# Patient Record
Sex: Male | Born: 1940 | Race: White | Hispanic: No | Marital: Married | Smoking: Former smoker
Health system: Southern US, Community
[De-identification: ages and names within clinical notes are randomized; demographics above are authoritative.]

## PROBLEM LIST (undated history)

## (undated) DIAGNOSIS — I472 Ventricular tachycardia, unspecified: Secondary | ICD-10-CM

## (undated) DIAGNOSIS — M1990 Unspecified osteoarthritis, unspecified site: Secondary | ICD-10-CM

## (undated) DIAGNOSIS — J449 Chronic obstructive pulmonary disease, unspecified: Secondary | ICD-10-CM

## (undated) DIAGNOSIS — Z95810 Presence of automatic (implantable) cardiac defibrillator: Secondary | ICD-10-CM

## (undated) DIAGNOSIS — I509 Heart failure, unspecified: Secondary | ICD-10-CM

## (undated) DIAGNOSIS — R0602 Shortness of breath: Secondary | ICD-10-CM

## (undated) DIAGNOSIS — G709 Myoneural disorder, unspecified: Secondary | ICD-10-CM

## (undated) DIAGNOSIS — I5022 Chronic systolic (congestive) heart failure: Secondary | ICD-10-CM

## (undated) DIAGNOSIS — I4891 Unspecified atrial fibrillation: Secondary | ICD-10-CM

## (undated) DIAGNOSIS — I10 Essential (primary) hypertension: Secondary | ICD-10-CM

## (undated) DIAGNOSIS — I2510 Atherosclerotic heart disease of native coronary artery without angina pectoris: Secondary | ICD-10-CM

## (undated) HISTORY — DX: Chronic obstructive pulmonary disease, unspecified: J44.9

## (undated) HISTORY — DX: Ventricular tachycardia: I47.2

## (undated) HISTORY — DX: Unspecified osteoarthritis, unspecified site: M19.90

## (undated) HISTORY — DX: Chronic systolic (congestive) heart failure: I50.22

## (undated) HISTORY — DX: Ventricular tachycardia, unspecified: I47.20

## (undated) HISTORY — PX: OTHER SURGICAL HISTORY: SHX169

## (undated) HISTORY — DX: Unspecified atrial fibrillation: I48.91

## (undated) HISTORY — DX: Essential (primary) hypertension: I10

## (undated) HISTORY — PX: VASECTOMY: SHX75

## (undated) HISTORY — DX: Presence of automatic (implantable) cardiac defibrillator: Z95.810

## (undated) HISTORY — PX: CARDIAC DEFIBRILLATOR PLACEMENT: SHX171

## (undated) HISTORY — DX: Atherosclerotic heart disease of native coronary artery without angina pectoris: I25.10

## (undated) HISTORY — PX: TONSILLECTOMY: SUR1361

## (undated) HISTORY — PX: APPENDECTOMY: SHX54

---

## 1998-03-17 ENCOUNTER — Ambulatory Visit (HOSPITAL_COMMUNITY): Admission: RE | Admit: 1998-03-17 | Discharge: 1998-03-17 | Payer: Self-pay | Admitting: *Deleted

## 2000-05-21 ENCOUNTER — Ambulatory Visit (HOSPITAL_COMMUNITY): Admission: RE | Admit: 2000-05-21 | Discharge: 2000-05-21 | Payer: Self-pay | Admitting: Gastroenterology

## 2000-05-21 ENCOUNTER — Encounter (INDEPENDENT_AMBULATORY_CARE_PROVIDER_SITE_OTHER): Payer: Self-pay | Admitting: *Deleted

## 2003-09-14 ENCOUNTER — Ambulatory Visit (HOSPITAL_COMMUNITY): Admission: RE | Admit: 2003-09-14 | Discharge: 2003-09-14 | Payer: Self-pay | Admitting: Internal Medicine

## 2004-03-07 ENCOUNTER — Encounter: Admission: RE | Admit: 2004-03-07 | Discharge: 2004-03-07 | Payer: Self-pay | Admitting: Internal Medicine

## 2006-11-06 ENCOUNTER — Ambulatory Visit (HOSPITAL_COMMUNITY): Admission: RE | Admit: 2006-11-06 | Discharge: 2006-11-06 | Payer: Self-pay | Admitting: Internal Medicine

## 2006-11-06 ENCOUNTER — Encounter: Payer: Self-pay | Admitting: Vascular Surgery

## 2006-11-06 ENCOUNTER — Ambulatory Visit: Payer: Self-pay | Admitting: Vascular Surgery

## 2007-09-28 ENCOUNTER — Ambulatory Visit: Payer: Self-pay | Admitting: Internal Medicine

## 2007-10-16 ENCOUNTER — Encounter: Payer: Self-pay | Admitting: Internal Medicine

## 2007-10-17 ENCOUNTER — Encounter: Payer: Self-pay | Admitting: Internal Medicine

## 2007-10-17 ENCOUNTER — Inpatient Hospital Stay (HOSPITAL_COMMUNITY): Admission: EM | Admit: 2007-10-17 | Discharge: 2007-10-19 | Payer: Self-pay | Admitting: Emergency Medicine

## 2007-10-18 ENCOUNTER — Encounter: Payer: Self-pay | Admitting: Internal Medicine

## 2007-10-18 ENCOUNTER — Encounter (INDEPENDENT_AMBULATORY_CARE_PROVIDER_SITE_OTHER): Payer: Self-pay | Admitting: Internal Medicine

## 2007-10-19 ENCOUNTER — Encounter: Payer: Self-pay | Admitting: Internal Medicine

## 2007-12-13 ENCOUNTER — Encounter: Payer: Self-pay | Admitting: Internal Medicine

## 2007-12-13 ENCOUNTER — Emergency Department (HOSPITAL_COMMUNITY): Admission: EM | Admit: 2007-12-13 | Discharge: 2007-12-13 | Payer: Self-pay | Admitting: Emergency Medicine

## 2009-08-19 ENCOUNTER — Encounter: Payer: Self-pay | Admitting: Internal Medicine

## 2009-08-24 ENCOUNTER — Encounter: Payer: Self-pay | Admitting: Internal Medicine

## 2009-08-30 ENCOUNTER — Ambulatory Visit: Payer: Self-pay | Admitting: Internal Medicine

## 2009-08-30 DIAGNOSIS — F17200 Nicotine dependence, unspecified, uncomplicated: Secondary | ICD-10-CM

## 2009-08-30 DIAGNOSIS — K219 Gastro-esophageal reflux disease without esophagitis: Secondary | ICD-10-CM | POA: Insufficient documentation

## 2009-08-30 DIAGNOSIS — E119 Type 2 diabetes mellitus without complications: Secondary | ICD-10-CM

## 2009-08-30 DIAGNOSIS — J449 Chronic obstructive pulmonary disease, unspecified: Secondary | ICD-10-CM | POA: Insufficient documentation

## 2009-08-30 DIAGNOSIS — I5022 Chronic systolic (congestive) heart failure: Secondary | ICD-10-CM

## 2009-08-30 DIAGNOSIS — M1990 Unspecified osteoarthritis, unspecified site: Secondary | ICD-10-CM | POA: Insufficient documentation

## 2009-08-30 DIAGNOSIS — I447 Left bundle-branch block, unspecified: Secondary | ICD-10-CM

## 2009-08-30 DIAGNOSIS — J4489 Other specified chronic obstructive pulmonary disease: Secondary | ICD-10-CM | POA: Insufficient documentation

## 2009-08-31 ENCOUNTER — Encounter: Payer: Self-pay | Admitting: Internal Medicine

## 2009-09-07 ENCOUNTER — Ambulatory Visit: Payer: Self-pay | Admitting: Internal Medicine

## 2009-09-10 ENCOUNTER — Encounter: Payer: Self-pay | Admitting: Internal Medicine

## 2009-09-10 ENCOUNTER — Observation Stay (HOSPITAL_COMMUNITY)
Admission: RE | Admit: 2009-09-10 | Discharge: 2009-09-10 | Payer: Self-pay | Source: Home / Self Care | Admitting: Internal Medicine

## 2009-09-13 ENCOUNTER — Encounter: Payer: Self-pay | Admitting: Internal Medicine

## 2009-09-13 ENCOUNTER — Telehealth: Payer: Self-pay | Admitting: Internal Medicine

## 2009-09-13 LAB — CONVERTED CEMR LAB
BUN: 29 mg/dL — ABNORMAL HIGH (ref 6–23)
Basophils Absolute: 0.1 10*3/uL (ref 0.0–0.1)
Basophils Relative: 0.7 % (ref 0.0–3.0)
CO2: 32 meq/L (ref 19–32)
Calcium: 9.7 mg/dL (ref 8.4–10.5)
Creatinine, Ser: 1.2 mg/dL (ref 0.4–1.5)
Eosinophils Absolute: 0.2 10*3/uL (ref 0.0–0.7)
Eosinophils Relative: 2.9 % (ref 0.0–5.0)
GFR calc non Af Amer: 63.69 mL/min (ref >60)
Glucose, Bld: 126 mg/dL — ABNORMAL HIGH (ref 70–99)
Lymphs Abs: 1.8 10*3/uL (ref 0.7–4.0)
Monocytes Absolute: 0.8 10*3/uL (ref 0.1–1.0)
Neutro Abs: 4.4 10*3/uL (ref 1.4–7.7)
Prothrombin Time: 27.8 s — ABNORMAL HIGH (ref 9.1–11.7)
RBC: 3.81 M/uL — ABNORMAL LOW (ref 4.22–5.81)
Sodium: 140 meq/L (ref 135–145)

## 2009-09-22 ENCOUNTER — Telehealth: Payer: Self-pay | Admitting: Internal Medicine

## 2009-09-27 ENCOUNTER — Ambulatory Visit: Payer: Self-pay | Admitting: Internal Medicine

## 2009-09-27 LAB — CONVERTED CEMR LAB
Basophils Absolute: 0 10*3/uL (ref 0.0–0.1)
Basophils Relative: 0.3 % (ref 0.0–3.0)
CO2: 30 meq/L (ref 19–32)
Calcium: 9.7 mg/dL (ref 8.4–10.5)
Creatinine, Ser: 1.2 mg/dL (ref 0.4–1.5)
Eosinophils Absolute: 0.1 10*3/uL (ref 0.0–0.7)
GFR calc non Af Amer: 63.68 mL/min (ref >60)
Hemoglobin: 12.9 g/dL — ABNORMAL LOW (ref 13.0–17.0)
INR: 3.1 — ABNORMAL HIGH (ref 0.8–1.0)
Lymphs Abs: 1.8 10*3/uL (ref 0.7–4.0)
MCV: 104.7 fL — ABNORMAL HIGH (ref 78.0–100.0)
Monocytes Absolute: 0.7 10*3/uL (ref 0.1–1.0)
Potassium: 4.7 meq/L (ref 3.5–5.1)
Prothrombin Time: 32 s — ABNORMAL HIGH (ref 9.1–11.7)
RBC: 3.65 M/uL — ABNORMAL LOW (ref 4.22–5.81)
RDW: 14.1 % (ref 11.5–14.6)
Sodium: 138 meq/L (ref 135–145)

## 2009-10-04 ENCOUNTER — Ambulatory Visit: Payer: Self-pay | Admitting: Internal Medicine

## 2009-10-04 ENCOUNTER — Inpatient Hospital Stay (HOSPITAL_COMMUNITY): Admission: RE | Admit: 2009-10-04 | Discharge: 2009-10-05 | Payer: Self-pay | Admitting: Internal Medicine

## 2009-10-06 ENCOUNTER — Encounter: Payer: Self-pay | Admitting: Internal Medicine

## 2009-10-08 ENCOUNTER — Telehealth: Payer: Self-pay | Admitting: Internal Medicine

## 2009-10-11 ENCOUNTER — Ambulatory Visit: Payer: Self-pay

## 2009-10-11 ENCOUNTER — Encounter: Payer: Self-pay | Admitting: Internal Medicine

## 2009-10-12 ENCOUNTER — Telehealth (INDEPENDENT_AMBULATORY_CARE_PROVIDER_SITE_OTHER): Payer: Self-pay | Admitting: *Deleted

## 2009-10-13 ENCOUNTER — Ambulatory Visit: Payer: Self-pay | Admitting: Internal Medicine

## 2009-10-20 ENCOUNTER — Ambulatory Visit: Payer: Self-pay | Admitting: Internal Medicine

## 2009-10-21 ENCOUNTER — Encounter: Payer: Self-pay | Admitting: Internal Medicine

## 2009-10-29 ENCOUNTER — Ambulatory Visit: Payer: Self-pay | Admitting: Internal Medicine

## 2009-10-29 LAB — CONVERTED CEMR LAB
BUN: 28 mg/dL — ABNORMAL HIGH (ref 6–23)
Basophils Relative: 0.1 % (ref 0.0–3.0)
Calcium: 9.4 mg/dL (ref 8.4–10.5)
Chloride: 99 meq/L (ref 96–112)
Eosinophils Relative: 3.2 % (ref 0.0–5.0)
GFR calc non Af Amer: 70.39 mL/min (ref >60)
INR: 2 — ABNORMAL HIGH (ref 0.8–1.0)
Lymphocytes Relative: 18.6 % (ref 12.0–46.0)
MCHC: 33.4 g/dL (ref 30.0–36.0)
Monocytes Absolute: 0.8 10*3/uL (ref 0.1–1.0)
Monocytes Relative: 9.2 % (ref 3.0–12.0)
Neutrophils Relative %: 68.9 % (ref 43.0–77.0)
Potassium: 4.2 meq/L (ref 3.5–5.1)
RBC: 3.67 M/uL — ABNORMAL LOW (ref 4.22–5.81)
RDW: 14.1 % (ref 11.5–14.6)
WBC: 8.2 10*3/uL (ref 4.5–10.5)
aPTT: 36.7 s — ABNORMAL HIGH (ref 21.7–28.8)

## 2009-11-05 ENCOUNTER — Inpatient Hospital Stay (HOSPITAL_COMMUNITY): Admission: RE | Admit: 2009-11-05 | Discharge: 2009-11-06 | Payer: Self-pay | Admitting: Internal Medicine

## 2009-11-05 ENCOUNTER — Ambulatory Visit: Payer: Self-pay | Admitting: Internal Medicine

## 2009-11-10 ENCOUNTER — Encounter: Payer: Self-pay | Admitting: Internal Medicine

## 2009-11-22 ENCOUNTER — Encounter: Payer: Self-pay | Admitting: Internal Medicine

## 2009-11-22 ENCOUNTER — Ambulatory Visit: Payer: Self-pay

## 2009-12-05 ENCOUNTER — Telehealth (INDEPENDENT_AMBULATORY_CARE_PROVIDER_SITE_OTHER): Payer: Self-pay | Admitting: Physician Assistant

## 2009-12-06 ENCOUNTER — Ambulatory Visit: Payer: Self-pay | Admitting: Internal Medicine

## 2009-12-06 ENCOUNTER — Telehealth: Payer: Self-pay | Admitting: Internal Medicine

## 2009-12-17 ENCOUNTER — Encounter: Payer: Self-pay | Admitting: Internal Medicine

## 2009-12-31 ENCOUNTER — Encounter: Payer: Self-pay | Admitting: Internal Medicine

## 2010-01-04 ENCOUNTER — Telehealth: Payer: Self-pay | Admitting: Internal Medicine

## 2010-01-04 ENCOUNTER — Ambulatory Visit: Payer: Self-pay | Admitting: Internal Medicine

## 2010-01-05 ENCOUNTER — Ambulatory Visit: Payer: Self-pay | Admitting: Internal Medicine

## 2010-04-12 ENCOUNTER — Ambulatory Visit: Payer: Self-pay | Admitting: Internal Medicine

## 2010-06-21 ENCOUNTER — Telehealth (INDEPENDENT_AMBULATORY_CARE_PROVIDER_SITE_OTHER): Payer: Self-pay | Admitting: *Deleted

## 2010-07-14 ENCOUNTER — Ambulatory Visit: Payer: Self-pay | Admitting: Internal Medicine

## 2010-08-09 ENCOUNTER — Telehealth: Payer: Self-pay | Admitting: Internal Medicine

## 2010-08-17 ENCOUNTER — Encounter (INDEPENDENT_AMBULATORY_CARE_PROVIDER_SITE_OTHER): Payer: Self-pay | Admitting: *Deleted

## 2010-09-06 NOTE — Letter (Signed)
SummaryDeboraha Landry Cardiology Office Note  [facility] Office Note   Imported By: Roderic Ovens 10/28/2009 11:33:55  _____________________________________________________________________  External Attachment:    Type:   Image     Comment:   External Document

## 2010-09-06 NOTE — Letter (Signed)
Summary: Implantable Device Instructions  Architectural technologist, Main Office  [address]   [place], [place] [address]   Phone: [phone]  Fax: [phone]      Implantable Device Instructions  You are scheduled for:  _____ Permanent Transvenous Pacemaker _____ Implantable Cardioverter Defibrillator _____ Implantable Loop Recorder _____ Generator Change __X___ Lead Repositioning  on Friday, November 05, 2009 with Dr. Graciela Husbands.  1.  Please arrive at the Short Stay Center at [facility] at 10AM on the day of your procedure.  2.  Do not eat or drink the night before your procedure.  3.  Complete lab work on Friday, October 29, 2009.  The lab at [facility] is open from 8:30 AM to 1:30 PM and from 2:30 PM to 5:00 PM.  The lab at [facility] is open from 7:30 AM to 5:30 PM.  You do not have to be fasting.  4.  Do NOT take these medications the morning of your procedure:  Metformin.  Take your last dose of Coumadin and Plavix on November 02, 2009.  5.  Plan for an overnight stay.  Bring your insurance cards and a list of your medications.  6.  Wash your chest and neck with antibacterial soap (any brand) the evening before and the morning of your procedure.  Rinse well.  7.  Education material received:     Pacemaker _____           ICD _____           Arrhythmia _____  *If you have ANY questions after you get home, please call the office [phone].  Amber or Shawna Orleans  *Every attempt is made to prevent procedures from being rescheduled.  Due to the nauture of Electrophysiology, rescheduling can happen.  The physician is always aware and directs the staff when this occurs.

## 2010-09-06 NOTE — Progress Notes (Signed)
Summary: Records Request  Faxed OV, EKG, EP Visit & Labs to [facility] at the [facility] [place] ([phone]). Debby Freiberg  June 21, 2010 12:36 PM

## 2010-09-06 NOTE — Procedures (Signed)
Summary: add-on.amber   Current Medications (verified): 1)  Furosemide 40 Mg Tabs (Furosemide) .... Take One Tablet Once Daily 2)  Metoprolol Tartrate 50 Mg Tabs (Metoprolol Tartrate) .... Take One Tablet Two Times A Day-Hold 3)  Plavix 75 Mg Tabs (Clopidogrel Bisulfate) .... Once Daily 4)  Metformin Hcl 500 Mg Tabs (Metformin Hcl) .... Two Times A Day 5)  Warfarin Sodium 5 Mg Tabs (Warfarin Sodium) .... Take As Directed By Coumadin Clinic. 6)  Lisinopril 5 Mg Tabs (Lisinopril) .... Take One Tablet Once Daily 7)  Lipitor 10 Mg Tabs (Atorvastatin Calcium) .... Once Daily 8)  Advair Diskus 100-50 Mcg/dose Aepb (Fluticasone-Salmeterol) .... Rarely 9)  Proventil Hfa 108 (90 Base) Mcg/act Aers (Albuterol Sulfate) .... As Needed 10)  Colcrys 0.6 Mg Tabs (Colchicine) .... Take As Needed  Allergies (verified): No Known Drug Allergies   ICD Specifications Following MD:  Sherryl Manges, MD     ICD Vendor:  St Jude     ICD Model Number:  [phone]     ICD Serial Number:  [id number] ICD DOI:  10/04/2009     ICD Implanting MD:  Sherryl Manges, MD  Lead 1:    Location: RA     DOI: 10/04/2009     Model #: [id number]     Serial #: [id number]     Status: active Lead 2:    Location: RV     DOI: 10/04/2009     Model #: [id number]     Serial #: [id number]     Status: active Lead 3:    Location: LV     DOI: 10/04/2009     Model #: 1258T     Serial #: [id number]     Status: active  Indications::  CM; Huston Foley   ICD Follow Up Remote Check?  No Battery Voltage:  >95% V     Charge Time:  8.0 seconds     Battery Est. Longevity:  6.2 years Underlying rhythm:  SR WITH FREQUENTLY CONDUCTED PAC'S ICD Dependent:  No       ICD Device Measurements Atrium:  Amplitude: 2.3 mV, Impedance: 340 ohms, Threshold: 0.875 V at 0.5 msec Right Ventricle:  Amplitude: 4.9 mV, Impedance: 610 ohms, Threshold: 1.25 V at 0.5 msec Left Ventricle:  Impedance: 280 ohms, Threshold: 0.5 V at 0.5 msec Configuration: LV TIP TO RV COIL Shock Impedance: 37 ohms     Episodes MS Episodes:  16     Percent Mode Switch:  <1%     Coumadin:  No Shock:  0     ATP:  0     Nonsustained:  0     Atrial Pacing:  76%     Ventricular Pacing:  78%  Brady Parameters Mode DDDR     Lower Rate Limit:  60     Upper Rate Limit 110 PAV 225     Sensed AV Delay:  200  Tachy Zones VF:  200     VT:  179 (MONITOR)     Tech Comments:  Pt seen as add-on today for diaphragmatic stim.  Pt notices it mostly after eating a large meal or when he sits forward in his chair.  Unable to reproduce today with just RV or LV pacing.  Pt only notices it in office when coming off threshold testing of V leads.  Sandie with [facility] checked patient on Friday and felt that it was coming from RV lead.  Pt's RV lead had to be repositioned during procedure because concerns of  micro-perf with pleuritic chest pain.  Bedside echo done post procedure ruled out effusion.  Pt with no chest pain or SOB.  RV and LV outputs set today at 2V at 0.33msec to try to minimize diaphragmatic stim.  Pt will keep appt next week to re-evalaute.  D/W Dr Graciela Husbands later in the day, he would have a low threshold for moving RV lead. Gypsy Balsam RN BSN  October 12, 2009 8:45 AM

## 2010-09-06 NOTE — Progress Notes (Signed)
Summary: Pt having muscle spasm like before  Phone Note Call from Patient Call back at Home Phone [phone]   Caller: Patient Summary of Call: Pt having muscle spasm again like before Initial call taken by: Judie Grieve,  Jan 04, 2010 4:35 PM  Follow-up for Phone Call        EVERY ONCE IN AWHILE GETS A MUSCLE SPASM LIKE BEFORE. DOESNT HAVE THE SAME FREQUENTCY AS BEFORE BUT FEELS LIKE PACER NEEDS TO BE ADJUSTED.  WILL REVIEW WITH MD AND CALL PT BACK WITH INSTRUCTIONS  PAM FLEMING-HAYES, RN PER DR KLEIN PT SCHEDULED FOR RE ADJUSTMENT ON WED 01/05/10 AT 3:15  PT AWARE. Follow-up by: Charolotte Capuchin, RN,  Jan 04, 2010 5:23 PM

## 2010-09-06 NOTE — Procedures (Signed)
Summary: icd check/st. jude   Allergies: No Known Drug Allergies   ICD Specifications Following MD:  Sherryl Manges, MD     ICD Vendor:  St Jude     ICD Model Number:  [phone]     ICD Serial Number:  [id number] ICD DOI:  10/04/2009     ICD Implanting MD:  Sherryl Manges, MD  Lead 1:    Location: RA     DOI: 10/04/2009     Model #: [id number]     Serial #: [id number]     Status: active Lead 2:    Location: RV     DOI: 10/04/2009     Model #: 7253     Serial #: [id number]     Status: active Lead 3:    Location: LV     DOI: 10/04/2009     Model #: 1258T     Serial #: [id number]     Status: active  Indications::  CM; Huston Foley

## 2010-09-06 NOTE — Procedures (Signed)
Summary: RE-ADJUST PACER PER SCK   Allergies: No Known Drug Allergies    ICD Specifications Following MD:  Sherryl Manges, MD     Referring MD:  Anne Fu ICD Vendor:  St Jude     ICD Model Number:  [phone]     ICD Serial Number:  [phone] ICD DOI:  10/04/2009     ICD Implanting MD:  Sherryl Manges, MD  Lead 1:    Location: RA     DOI: 10/04/2009     Model #: [id number]     Serial #: [id number]     Status: active Lead 2:    Location: RV     DOI: 10/04/2009     Model #: 7121     Serial #: [id number]     Status: explanted Lead 3:    Location: LV     DOI: 10/04/2009     Model #: 1258T     Serial #: [id number]     Status: active Lead 4:    Location: RV     DOI: 11/05/2009     Model #: 5366     Serial #: [id number]     Status: active  Indications::  CM; Huston Foley   ICD Follow Up ICD Dependent:  No       ICD Device Measurements Configuration: LV RING TO RV COIL  Episodes Coumadin:  Yes  Brady Parameters Mode DDDR     Lower Rate Limit:  60     Upper Rate Limit 110 PAV 225     Sensed AV Delay:  200  Tachy Zones VF:  200     VT:  179 (MONITOR)     Tech Comments:  LV output decreased from 3.5 to 3V to see if that resolves stim.  No other changes made.  ROV as scheduled. Gypsy Balsam RN BSN  January 05, 2010 3:38 PM

## 2010-09-06 NOTE — Cardiovascular Report (Signed)
Summary: Office Visit   Office Visit   Imported By: Roderic Ovens 11/24/2009 16:39:25  _____________________________________________________________________  External Attachment:    Type:   Image     Comment:   External Document

## 2010-09-06 NOTE — Assessment & Plan Note (Signed)
Summary: Jared Landry   Primary Provider:  Dr. Bryson Corona   History of Present Illness: Jared Landry is seen following CRT-D implantation implanted for congestive heart failure in the setting of coronary artery disease atrial fibrillation and diabetes.  the procedure has been complicated by diaphragmatic stimulation related what I thought was  right ventricular stimulation which was solvable only by turning off the RV lead. it turned out that I had an initial implantation inverted the right and left ventricular rate since leads and that the diaphragmatic stimulation thought related to RV lead was in fact related to the LV lead.  I should note that also DFT testing was deferred at the initial implant secondary to concerns about pericardial irritation which prompted repositioning of his RV lead and was not undertaken at the second procedure.  The patient has done well since his second procedure with improved energy less dyspnea on exertion. He denies chest pain or peripheral edema.    He has occasional "hiccups" when he bends over; these are not a big deal.  Allergies: No Known Drug Allergies  Physical Exam  General:  The patient was alert and oriented in no acute distress. HEENT Normal.  Neck veins were flat, carotids were brisk.  Lungs were clear.  Heart sounds were regular without murmurs or gallops.  Abdomen was soft with active bowel sounds. There is no clubbing cyanosis or edema. Skin Warm and dry     ICD Specifications Following MD:  Sherryl Manges, MD     Referring MD:  [facility] ICD Vendor:  St Jude     ICD Model Number:  [phone]     ICD Serial Number:  [id number] ICD DOI:  10/04/2009     ICD Implanting MD:  Sherryl Manges, MD  Lead 1:    Location: RA     DOI: 10/04/2009     Model #: [id number]     Serial #: [id number]     Status: active Lead 2:    Location: RV     DOI: 10/04/2009     Model #: 7121     Serial #: [id number]     Status: explanted Lead 3:    Location: LV     DOI: 10/04/2009     Model #: 1258T      Serial #: [id number]     Status: active Lead 4:    Location: RV     DOI: 11/05/2009     Model #: 9485     Serial #: [id number]     Status: active  Indications::  CM; Huston Foley   ICD Follow Up ICD Dependent:  No       ICD Device Measurements Configuration: LV RING TO RV COIL  Episodes Coumadin:  Yes  Brady Parameters Mode DDDR     Lower Rate Limit:  60     Upper Rate Limit 120 PAV 225     Sensed AV Delay:  200  Tachy Zones VF:  200     VT:  179 (MONITOR)     Impression & Recommendations:  Problem # 1:  SYSTOLIC HEART FAILURE, CHRONIC (ICD-428.22) improved following CRT  Problem # 2:  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 Once daily    Warfarin Sodium 5 Mg Tabs (Warfarin sodium) .Marland Kitchen... 1 tablet every other day.  ! 1/2  tablet on days in between.    Lisinopril 5 Mg Tabs (Lisinopril) .Marland Kitchen... Take one tablet once daily    Aspirin 81 Mg Tbec (Aspirin) .Marland Kitchen... Take one tablet by mouth daily  Patient Instructions: 1)  Your physician recommends that you schedule a follow-up appointment in: YEAR WITH DR  Graciela Husbands 2)  Your physician recommends that you continue on your current medications as directed. Please refer to the Current Medication list given to you today.

## 2010-09-06 NOTE — Progress Notes (Signed)
Summary: CALLING TO R/S APPT FOR SUERGERY  Phone Note Call from Patient Call back at Home Phone [phone]   Caller: Patient Summary of Call: PT CALLING TO R/S SURGERY FOR HIS DEFIB. Initial call taken by: Judie Grieve,  September 13, 2009 10:18 AM  Follow-up for Phone Call        Pt was sent home from the hospital the day of his originally scheduled procedure d/t a rash. Pt was rescheduled for 10/01/20 at 1300. Pt will come for labs on 09/24/09. He will call to reschedule if he still has his rash.  Follow-up by: Duncan Dull, RN, BSN,  September 13, 2009 10:38 AM

## 2010-09-06 NOTE — Miscellaneous (Signed)
Summary: Lead revision  Clinical Lists Changes  Observations: Added new observation of ICDLEADSTAT4: active (11/10/2009 13:11) Added new observation of ICDLEADSER4: [id number] (11/10/2009 13:11) Added new observation of ICDLEADMOD4: 7121  (11/10/2009 13:11) Added new observation of ICDLEADDOI4: 11/05/2009  (11/10/2009 13:11) Added new observation of ICDLEADLOC4: RV  (11/10/2009 13:11) Added new observation of ICDLEADSTAT2: abandoned  (11/10/2009 13:11)       ICD Specifications Following MD:  Sherryl Manges, MD     ICD Vendor:  St Jude     ICD Model Number:  [phone]     ICD Serial Number:  [phone] ICD DOI:  10/04/2009     ICD Implanting MD:  Sherryl Manges, MD  Lead 1:    Location: RA     DOI: 10/04/2009     Model #: [id number]     Serial #: [id number]     Status: active Lead 2:    Location: RV     DOI: 10/04/2009     Model #: 5573     Serial #: [id number]     Status: abandoned Lead 3:    Location: LV     DOI: 10/04/2009     Model #: 1258T     Serial #: [id number]     Status: active Lead 4:    Location: RV     DOI: 11/05/2009     Model #: 1517     Serial #: [id number]     Status: active  Indications::  CM; Huston Foley   ICD Follow Up ICD Dependent:  No       ICD Device Measurements Configuration: LV TIP TO RV COIL  Episodes Coumadin:  No  Brady Parameters Mode DDDR     Lower Rate Limit:  60     Upper Rate Limit 110 PAV 225     Sensed AV Delay:  200  Tachy Zones VF:  200     VT:  179

## 2010-09-06 NOTE — Progress Notes (Signed)
Summary: Device Check  Phone Note Call from Patient Call back at Home Phone [phone]   Caller: Patient Reason for Call: Talk to Nurse, Talk to Doctor Summary of Call: pt defib is vibrating and was told by Bjorn Loser that he could be seen today by pacer clinic. Pt wants to come in around noon because the device seems to do this on a 10hr cycle Initial call taken by: Omer Jack,  Dec 06, 2009 9:38 AM  Follow-up for Phone Call        I spoke with Gunnar Fusi about this pt and she would like the pt to come in today at 11:45 for a device check.  Pt aware of appointment.   Follow-up by: Julieta Gutting, RN, BSN,  Dec 06, 2009 10:01 AM

## 2010-09-06 NOTE — Cardiovascular Report (Signed)
Summary: Office Visit   Office Visit   Imported By: Roderic Ovens 10/15/2009 12:19:00  _____________________________________________________________________  External Attachment:    Type:   Image     Comment:   External Document

## 2010-09-06 NOTE — Progress Notes (Signed)
Summary: pt feels pacer is not doing what it should  Phone Note Call from Patient Call back at Home Phone [phone]   Caller: Patient Reason for Call: Talk to Nurse, Talk to Doctor Summary of Call: pt just got pacer implanted and he is getting some hickup reactions like pacer is hitting something it shouldn't. no pain. some swelling around wound and bruising Initial call taken by: Omer Jack,  October 08, 2009 10:39 AM  Follow-up for Phone Call        Spoke with patient.  He declined appt today.  He will keep appt on Monday.  Pt reassured that is it likely diaphragmatic stim, nuisance, not a problem.  He did not want to come into office today. States that it is positional and intermittent.  Gypsy Balsam RN BSN  October 08, 2009 10:51 AM

## 2010-09-06 NOTE — Cardiovascular Report (Signed)
Summary: Office Visit   Office Visit   Imported By: Roderic Ovens 01/21/2010 14:55:27  _____________________________________________________________________  External Attachment:    Type:   Image     Comment:   External Document

## 2010-09-06 NOTE — Letter (Signed)
Summary: [facility] OV w/out Gyn History   [facility] OV w/out Gyn History   Imported By: Roderic Ovens 10/28/2009 11:31:47  _____________________________________________________________________  External Attachment:    Type:   Image     Comment:   External Document

## 2010-09-06 NOTE — Progress Notes (Signed)
Summary: change procedure date  Phone Note Outgoing Call Call back at [facility] Phone [phone]   Call placed by: Gypsy Balsam RN BSN,  September 22, 2009 9:44 AM Summary of Call: Spoke with patient.  CRT-D rescheduled to 10-04-09 at 1PM because of needing to put another pt on 2-25. Pt will have labs 2-21 instead of 2-18. Gypsy Balsam RN BSN  September 22, 2009 9:45 AM

## 2010-09-06 NOTE — Cardiovascular Report (Signed)
Summary: Pre Op Orders  Pre Op Orders   Imported By: Roderic Ovens 11/11/2009 12:13:40  _____________________________________________________________________  External Attachment:    Type:   Image     Comment:   External Document

## 2010-09-06 NOTE — Assessment & Plan Note (Signed)
Summary: DEVICE/SAF   Primary Provider:  Dr. Bryson Corona   History of Present Illness: Jared Landry is seen following CRT-D implantation implanted for congestive heart failure in the setting of coronary artery disease atrial fibrillation and diabetes.  the procedure has been complicated by diaphragmatic stimulation related what I thought was  right ventricular stimulation which was solvable only by turning off the RV lead. it turned out that I had an initial implantation inverted the right and left ventricular rate since leads and that the diaphragmatic stimulation thought related to RV lead was in fact related to the LV lead.  I should note that also DFT testing was deferred at the initial implant secondary to concerns about pericardial irritation which prompted repositioning of his RV lead and was not undertaken at the second procedure.  The patient has done well since his second procedure with improved energy less dyspnea on exertion. He denies chest pain or peripheral edema  Current Medications (verified): 1)  Furosemide 40 Mg Tabs (Furosemide) .... Take One Tablet Once Daily 2)  Metoprolol Succinate 100 Mg Xr24h-Tab (Metoprolol Succinate) .... Once Daily 3)  Metformin Hcl 500 Mg Tabs (Metformin Hcl) .... Two Times A Day 4)  Warfarin Sodium 5 Mg Tabs (Warfarin Sodium) .Marland Kitchen.. 1 Tablet Every Other Day.  ! 1/2 Tablet On Days in Between. 5)  Lisinopril 5 Mg Tabs (Lisinopril) .... Take One Tablet Once Daily 6)  Lipitor 10 Mg Tabs (Atorvastatin Calcium) .... Once Daily 7)  Advair Diskus 100-50 Mcg/dose Aepb (Fluticasone-Salmeterol) .... Rarely 8)  Proventil Hfa 108 (90 Base) Mcg/act Aers (Albuterol Sulfate) .... As Needed 9)  Colcrys 0.6 Mg Tabs (Colchicine) .... Take As Needed 10)  Aspirin 81 Mg Tbec (Aspirin) .... Take One Tablet By Mouth Daily 11)  Vitamin D 1000 Unit Tabs (Cholecalciferol) .... Once Daily 12)  B Complex  Tabs (B Complex Vitamins) .... Take One Tablet Every Other Day 13)  Zinc 100  Mg Tabs (Zinc) .... Every 3 Days 14)  Calcium 1500 Mg Tabs (Calcium Carbonate) .... Every Day  Allergies (verified): No Known Drug Allergies  Past History:  Past Medical History: Last updated: 09/17/09 Atrial fibrillation coronary artery disease s/p drug eluting stent type II diabetes Hypertension Gout Osteoarthritis COPD  Past Surgical History: Last updated: 17-Sep-2009 appendectomy T & A  vasectomy Surgical I & D after  brown recluse Inner thigh  Family History: Last updated: 17-Sep-2009 Mother CHF died at 87 yrs Sister-alive DM Father deceased due to ruptured appendix: age 80  Social History: Last updated: 09-17-2009 Part Time-commercial real estate Tobacco Use - Yes. 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 HEENT Normal.  Neck veins were flat, carotids were brisk.  Lungs were clear.  Heart sounds were regular without murmurs or gallops.  Abdomen was soft with active bowel sounds. There is no clubbing cyanosis or edema. Skin Warm and dry     ICD Specifications Following MD:  Sherryl Manges, MD  Referring MD:  Anne Fu ICD Vendor:  St Jude     ICD Model Number:  [id number]     ICD Serial Number:  [id number] ICD DOI:  10/04/2009     ICD Implanting MD:  Sherryl Manges, MD  Lead 1:    Location: RA     DOI: 10/04/2009     Model #: [id number]     Serial #: [id number]     Status: active Lead 2:    Location: RV     DOI: 10/04/2009     Model #: 9562     Serial #: [id number]     Status: explanted Lead 3:    Location: LV     DOI: 10/04/2009     Model #: 1258T     Serial #:  [id number]     Status: active Lead 4:    Location: RV     DOI: 11/05/2009     Model #: 4132     Serial #: [id number]     Status: active  Indications::  CM; Huston Foley   ICD Follow Up Remote Check?  No Charge Time:  8.8 seconds     Battery Est. Longevity:  3.8 years Underlying rhythm:  SR ICD Dependent:  No       ICD Device Measurements Atrium:  Amplitude: 1.0 mV, Impedance: 360 ohms, Threshold: 1.0 V at 0.5 msec Right Ventricle:  Amplitude: 9.0 mV, Impedance: 490 ohms, Threshold: 0.5 V at 0.5 msec Left Ventricle:  Impedance: 410 ohms, Threshold: 3.0 V at 0.8 msec Configuration: LV RING TO RV COIL Shock Impedance: 70 ohms   Episodes MS Episodes:  704     Percent Mode Switch:  2.2%     Coumadin:  Yes Shock:  0     ATP:  0     Nonsustained:  0     Atrial Pacing:  90%     Ventricular Pacing:  95%  Brady Parameters Mode DDDR     Lower Rate Limit:  60     Upper Rate Limit 120 PAV 225     Sensed AV Delay:  200  Tachy Zones VF:  200     VT:  179 (MONITOR)     Next Remote Date:  07/14/2010     Next Cardiology Appt Due:  04/08/2011 Tech Comments:  PVARP reprogrammed to for 1162 PMT episodes.  704 Mode switch episodes the longest 2 days, + coumadin.  Merlin transmissions every 3 months.  ROV 1 year with Dr. Graciela Husbands. Altha Harm, LPN  April 12, 2010 10:59 AM   Impression & Recommendations:  Problem # 1:  SYSTOLIC HEART FAILURE, CHRONIC (ICD-428.22) significantly improved following CRT-D implantation His updated medication list for this problem includes:    Furosemide 40 Mg Tabs (Furosemide) .Marland Kitchen... Take one tablet once daily    Metoprolol Succinate 100 Mg Xr24h-tab (Metoprolol succinate) ..... Once daily    Warfarin Sodium 5 Mg Tabs (Warfarin sodium) .Marland Kitchen... As directed    Lisinopril 5 Mg Tabs (Lisinopril) .Marland Kitchen... Take one tablet once daily    Aspirin 81 Mg Tbec (Aspirin) .Marland Kitchen... 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 Take one tablet once daily    Metoprolol Succinate 100 Mg Xr24h-tab (Metoprolol succinate) ..... Once daily    Warfarin Sodium 5 Mg Tabs (Warfarin sodium) .Marland Kitchen... As directed  Lisinopril 5 Mg Tabs (Lisinopril) .Marland Kitchen... Take one tablet once daily    Aspirin 81 Mg Tbec (Aspirin) .Marland Kitchen... Take one tablet by mouth daily

## 2010-09-06 NOTE — Procedures (Signed)
Summary: icd check/st. jude   Current Medications (verified): 1)  Furosemide 40 Mg Tabs (Furosemide) .... Take One Tablet Once Daily 2)  Metoprolol Tartrate 50 Mg Tabs (Metoprolol Tartrate) .... 2 By Mouth Daily 3)  Metformin Hcl 500 Mg Tabs (Metformin Hcl) .... Two Times A Day 4)  Warfarin Sodium 5 Mg Tabs (Warfarin Sodium) .Marland Kitchen.. 1 Tablet Every Other Day.  ! 1/2 Tablet On Days in Between. 5)  Lisinopril 5 Mg Tabs (Lisinopril) .... Take One Tablet Once Daily 6)  Lipitor 10 Mg Tabs (Atorvastatin Calcium) .... Once Daily 7)  Advair Diskus 100-50 Mcg/dose Aepb (Fluticasone-Salmeterol) .... Rarely 8)  Proventil Hfa 108 (90 Base) Mcg/act Aers (Albuterol Sulfate) .... As Needed 9)  Colcrys 0.6 Mg Tabs (Colchicine) .... Take As Needed 10)  Aspirin 81 Mg Tbec (Aspirin) .... Take One Tablet By Mouth Daily  Allergies (verified): No Known Drug Allergies   ICD Specifications Following MD:  Sherryl Manges, MD     Referring MD:  Anne Fu ICD Vendor:  [employer] Jude     ICD Model Number:  [phone]     ICD Serial Number:  [id number] ICD DOI:  10/04/2009     ICD Implanting MD:  Sherryl Manges, MD  Lead 1:    Location: RA     DOI: 10/04/2009     Model #: [id number]     Serial #: [id number]     Status: active Lead 2:    Location: RV     DOI: 10/04/2009     Model #: 4010     Serial #: [id number]     Status: explanted Lead 3:    Location: LV     DOI: 10/04/2009     Model #: 1258T     Serial #: [id number]     Status: active Lead 4:    Location: RV     DOI: 11/05/2009     Model #: 5638     Serial #: [id number]     Status: active  Indications::  CM; Huston Foley   ICD Follow Up Charge Time:  8.0 seconds     Battery Est. Longevity:  3.7 years ICD Dependent:  No       ICD Device Measurements Configuration: LV RING TO RV COIL Shock Impedance: 56 ohms   Episodes Coumadin:  Yes  Brady Parameters Mode DDDR     Lower Rate Limit:  60     Upper Rate Limit 110 PAV 225     Sensed AV Delay:  200  Tachy Zones VF:  200     VT:  179 (MONITOR)      Tech Comments:  The patient was seen today at his request for vibration coming from the device. On interrogation there were alerts noted for the HV lead impedance >125.  The trend shows the RV->SVC 98-125 and SVC->Can 69-130.  I reprogrammed the shock configuration RV -> Can.  I will discuss this with Dr. Graciela Husbands and I also cleared all the alerts today. Altha Harm, LPN  Dec 07, 1882 1:13 PM

## 2010-09-06 NOTE — Assessment & Plan Note (Signed)
Summary: pc2/jml   Primary Provider:  Dr. Bryson Corona   History of Present Illness: [name] is seen following CRT-D implantation implanted for congestive heart failure in the setting of coronary artery disease atrial fibrillation and diabetes.  the procedure has been complicated by diaphragmatic stimulation related what I thought was  right ventricular stimulation which was solvable only by turning off the RV lead. it turned out that I had an initial implantation inverted the right and left ventricular rate since leads and that the diaphragmatic stimulation thought related to RV lead was in fact related to the LV lead.  I should note that also DFT testing was deferred at the initial implant secondary to concerns about pericardial irritation which prompted repositioning of his RV lead and was not undertaken at the second procedure.  The patient has done well since his second procedure with improved energy less dyspnea on exertion. He denies chest pain or peripheral edema  Current Medications (verified): 1)  Furosemide 40 Mg Tabs (Furosemide) .... Take One Tablet Once Daily 2)  Metoprolol Succinate 100 Mg Xr24h-Tab (Metoprolol Succinate) .... Once Daily 3)  Metformin Hcl 500 Mg Tabs (Metformin Hcl) .... Two Times A Day 4)  Warfarin Sodium 5 Mg Tabs (Warfarin Sodium) .Marland Kitchen.. 1 Tablet Every Other Day.  ! 1/2 Tablet On Days in Between. 5)  Lisinopril 5 Mg Tabs (Lisinopril) .... Take One Tablet Once Daily 6)  Lipitor 10 Mg Tabs (Atorvastatin Calcium) .... Once Daily 7)  Advair Diskus 100-50 Mcg/dose Aepb (Fluticasone-Salmeterol) .... Rarely 8)  Proventil Hfa 108 (90 Base) Mcg/act Aers (Albuterol Sulfate) .... As Needed 9)  Colcrys 0.6 Mg Tabs (Colchicine) .... Take As Needed 10)  Aspirin 81 Mg Tbec (Aspirin) .... Take One Tablet By Mouth Daily 11)  Potassium 99 Mg Tabs (Potassium) .... Take One Every Three Days 12)  Vitamin D 1000 Unit Tabs (Cholecalciferol) .... Once Daily  Allergies (verified): No  Known Drug Allergies  Past History:  Past Medical History: Last updated: 2009-09-19 Atrial fibrillation coronary artery disease s/p drug eluting stent type II diabetes Hypertension Gout Osteoarthritis COPD  Past Surgical History: Last updated: 09/19/2009 appendectomy T & A  vasectomy Surgical I & D after  brown recluse Inner thigh  Family History: Last updated: 09/19/2009 Mother CHF died at 45 yrs Sister-alive DM Father deceased due to ruptured appendix: age 75  Social History: Last updated: 09/19/09 Part Time-commercial real estate Tobacco Use - Yes. -1/3 ppd Alcohol Use - yes-daily consumption 2-3 shots daily Drug Use - no  Vital Signs:  Patient profile:   70 year old male Height:      69 inches Weight:      203 pounds BMI:     30.09 Pulse rate:   65 / minute Pulse rhythm:   regular BP sitting:   128 / 76  (right arm) Cuff size:   regular  Vitals Entered By: Judithe Modest CMA (Jan 04, 2010 9:24 AM)  Physical Exam  General:  The patient was alert and oriented in no acute distress. HEENT Normal.  Neck veins were flat, carotids were brisk.  Lungs were clear.  Heart sounds were regular without murmurs or gallops.  Abdomen was soft with active bowel sounds. There is no clubbing cyanosis or edema. Skin Warm and dry the wound is well-healed    ICD Specifications Following MD:  Sherryl Manges, MD     Referring MD:  Anne Fu ICD Vendor:  [facility]     ICD Model Number:  [phone]  ICD Serial Number:  [id number] ICD DOI:  10/04/2009     ICD Implanting MD:  Sherryl Manges, MD  Lead 1:    Location: RA     DOI: 10/04/2009     Model #: [id number]     Serial #: [id number]     Status: active Lead 2:    Location: RV     DOI: 10/04/2009     Model #: 7121     Serial #: [id number]     Status: explanted Lead 3:    Location: LV     DOI: 10/04/2009     Model #: 1258T     Serial #: [id number]     Status: active Lead 4:    Location: RV     DOI: 11/05/2009     Model #: 4403     Serial #:  [id number]     Status: active  Indications::  CM; Huston Foley   ICD Follow Up Charge Time:  8.0 seconds     Battery Est. Longevity:  4.0-4.4 yrs Underlying rhythm:  SR ICD Dependent:  No       ICD Device Measurements Atrium:  Amplitude: 1.8 mV, Impedance: 380 ohms, Threshold: 0.5 V at 0.5 msec Right Ventricle:  Amplitude: 11.4 mV, Impedance: 610 ohms, Threshold: 0.5 V at 0.5 msec Left Ventricle:  Impedance: 330 ohms, Threshold: 2.75 V at 0.8 msec Configuration: LV RING TO RV COIL  Episodes Coumadin:  Yes Atrial Pacing:  89%     Ventricular Pacing:  93%  Brady Parameters Mode DDDR     Lower Rate Limit:  60     Upper Rate Limit 120 PAV 225     Sensed AV Delay:  200  Tachy Zones VF:  200     VT:  179 (MONITOR)     Next Cardiology Appt Due:  03/07/2010 Tech Comments:  Changes: MSR FROM 110 TO 130 AND MTR FROM 110 TO 120/PMT DETECTION RATE FROM 110 TO 120/ TURNED PVC RESPONSE ON/TURNED RATE RESPONSIVE PVARP/VREF FROM HIGH TO OFF/ RV AMPLITUDE FROM 3.5 TO 2.5/SLOPE FROM8 TO 10/THRESHOLD FROM AUTO (+0.0) TO AUTO (-0.5).  NORMAL THRESHOLD TESTING.  PMT EPISODES NOT REAL--DUE TO ATRIAL RATE GOING OVER 110 bpm.  ROV IN 3 MTHS. 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  Heart rate sensors were made more sensitive. He is advised to let us know if he feels tachypalpitations. His updated medication list for this problem includes:    Furosemide 40 Mg Tabs (Furosemide) .Marland Kitchen... Take one tablet once daily    Metoprolol Succinate 100 Mg Xr24h-tab (Metoprolol succinate) ..... Once daily    Warfarin Sodium 5 Mg Tabs (Warfarin sodium) .Marland Kitchen... 1 tablet every other day.  ! 1/2 tablet on days in between.    Lisinopril 5 Mg Tabs (Lisinopril) .Marland Kitchen...  Take one tablet once daily    Aspirin 81 Mg Tbec (Aspirin) .Marland Kitchen... 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 Take one tablet once daily    Metoprolol Succinate 100 Mg Xr24h-tab (Metoprolol succinate) ..... Once daily    Warfarin Sodium 5 Mg  Tabs (Warfarin sodium) .Marland Kitchen... 1 tablet every other day.  ! 1/2 tablet on days in between.    Lisinopril 5 Mg Tabs (Lisinopril) .Marland Kitchen... Take one tablet once daily    Aspirin 81 Mg Tbec (Aspirin) .Marland Kitchen... Take one tablet by mouth daily  Problem # 3:  DIAPHRAGMATIC STIMULATION VIA RV LEAD (ICD-996.04) as noted this turned out to be related to the LV lead which was repositioned  Problem # 4:  IMPEDANCE CHANGES ON THE SVC COIL-OFF (ICD-996.04) a high impedance along with noted. The SVC coil has been turned off at his next visit we'll anticipate scheduling DFT testing following stabilization of his heart failure

## 2010-09-06 NOTE — Progress Notes (Signed)
  Phone Note Call from Patient   Call For: Dr Sherryl Manges Reason for Call: Acute Illness Summary of Call: Pt had defib inserted 3-11 and leads changed 4-11. Pt heard buzzing from device for a few seconds twice. No Sx, no palps. Doing well post-proc. Contacted [facility] who advised pt should be seen Mon in device clinic. Left msg. Initial call taken by: Lavella Hammock Shawn Carattini PA-C

## 2010-09-06 NOTE — Cardiovascular Report (Signed)
Summary: Office Visit   Office Visit   Imported By: Roderic Ovens 04/19/2010 12:42:53  _____________________________________________________________________  External Attachment:    Type:   Image     Comment:   External Document

## 2010-09-06 NOTE — Cardiovascular Report (Signed)
Summary: Pre Op Orders   Pre Op Orders   Imported By: Roderic Ovens 09/22/2009 16:50:04  _____________________________________________________________________  External Attachment:    Type:   Image     Comment:   External Document

## 2010-09-06 NOTE — Miscellaneous (Signed)
  Clinical Lists Changes  Observations: Added new observation of CXR RESULTS:  Left subclavian pacemaker/defibrillator noted.  Heart is   mildly enlarged.  No CHF or pneumonia.  No pleural effusion or   pneumothorax.    IMPRESSION:   Left subclavian pacemaker/defibrillator   Cardiomegaly without CHF.  No pneumothorax.  (11/06/2009 14:42)      CXR  Procedure date:  11/06/2009  Findings:       Left subclavian pacemaker/defibrillator noted.  Heart is   mildly enlarged.  No CHF or pneumonia.  No pleural effusion or   pneumothorax.    IMPRESSION:   Left subclavian pacemaker/defibrillator   Cardiomegaly without CHF.  No pneumothorax.

## 2010-09-06 NOTE — Assessment & Plan Note (Signed)
Summary: nep/eval for icd biv   Primary Provider:  Dr. Bryson Corona  CC:  new patient evaluation for ICD.  Pt has hx of atrial fibrillation and SOB that accompanies exertion.Marland Kitchen  History of Present Illness: Jared Landry is seen at the request of Dr. Anne Fu for consideration of CRT-D implantation.  The patient is a 70 year old gentleman with a history of ischemic heart disease that became manifested 2009 after he presented with syncope. He did walking across a parking lot and collapsed. He awakened very quickly thereafter was able then to stand and walk. He underwent stress testing which was abnormal catheterization that demonstrated complex LAD disease for which he underwent DES placement at the junction of the first diagonal. His ejection fraction was 30-35%. Records also demonstrated atrial fibrillation was identified at that time. Anticoagulation beta blockers and ACE inhibitors were all initiated.  The patient presented in early January with symptoms of 2-3 months of progressive weight gain and shortness of breath. Diuretics were up titrated by his physician; however, within a day or 2 he began having recurrent spells of lightheadedness progressive dyspnea and fatigue and was found to have a heart rate in the 30s. Holter monitoring at that time demonstrated normal sinus rhythm, junctional bradycardia as well as nonsustained ventricular tachycardia. There has been episodes of "head numbness" that last 2-3 seconds and are associated with presyncope.  At this time his creatinine was also noted to be abnormal prompting discontinuation of meds THe apogee was 2.6 and most recently was 1.5  He continues to smoke. His diet is relatively salt depleted.  Preventive Screening-Counseling & Management  Alcohol-Tobacco     Smoking Status: current      Drug Use:  no.    Current Medications (verified): 1)  Furosemide 40 Mg Tabs (Furosemide) .... Take One Tablet Once Daily 2)  Metoprolol Tartrate 50 Mg Tabs  (Metoprolol Tartrate) .... Take One Tablet Two Times A Day-Hold 3)  Plavix 75 Mg Tabs (Clopidogrel Bisulfate) .... Once Daily 4)  Metformin Hcl 500 Mg Tabs (Metformin Hcl) .... Two Times A Day 5)  Warfarin Sodium 5 Mg Tabs (Warfarin Sodium) .... Take As Directed By Coumadin Clinic. 6)  Lisinopril 5 Mg Tabs (Lisinopril) .... Take One Tablet Once Daily 7)  Lipitor 10 Mg Tabs (Atorvastatin Calcium) .... Once Daily 8)  Advair Diskus 100-50 Mcg/dose Aepb (Fluticasone-Salmeterol) .... Rarely 9)  Proventil Hfa 108 (90 Base) Mcg/act Aers (Albuterol Sulfate) .... As Needed 10)  Colcrys 0.6 Mg Tabs (Colchicine) .... Take As Needed  Allergies (verified): No Known Drug Allergies  Past History:  Family History: Last updated: 09-19-2009 Mother CHF; died age 28 Father; died age 67 due to ruptured appendix Sister-DM  alive sister-alive  Social History: Last updated: 09/19/2009 Tobacco Use - Yes. 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 -1/3 ppd Alcohol Use - yes-daily consumption 2-3 shots  Part Time-Com[employer] real[employer] Drug Use - no Smoking Status:  current Drug Use:  no  Review of Systems  full review of systems was negative apart from a history of present illness and past medical history x night sweats on and off x 2-3 years,  ear pressure   Vital Signs:  Patient profile:   70 year old male Height:      69  inches Weight:      202 pounds BMI:     29.94 Pulse rate:   84 / minute Pulse rhythm:   irregular BP sitting:   160 / 80  (left arm) Cuff size:   regular  Vitals Entered By: Judithe Modest CMA (August 30, 2009 12:02 PM)  Physical Exam  General:  Alert and oriented elderly caucasian male appearing his stated age in no acute distress. HEENT  normal . Neck veins were flat; carotids brisk and full without bruits. No lymphadenopathy. Back without kyphosis. Lungs clear. Heart sounds irregular without murmurs or gallops. PMI  displaced. Abdomen soft with active bowel sounds without midline pulsation or hepatomegaly. Femoral pulses and distal pulses intact. Extremities were without clubbing cyanosis or edemaSkin warm and dry. 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 Take one tablet once daily    Metoprolol Tartrate 50 Mg Tabs (Metoprolol tartrate) .Marland Kitchen... Take one tablet two times a day-hold    Plavix 75 Mg Tabs (Clopidogrel bisulfate) ..... Once daily    Warfarin Sodium 5 Mg Tabs (Warfarin sodium) .Marland Kitchen... Take as directed by coumadin clinic.    Lisinopril 5 Mg Tabs (Lisinopril) .Marland Kitchen... 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 He would benefit from there been resumed. We have discussed the potential benefits of ICD implantation for primary prevention of  sudden cardiac death. We discussed also the potential risks of the procedure including and not limited to death peroforation lead dislodgment,  device malfunction and infection he understands the risks and is willing to proceed His updated medication list for this problem includes:    Furosemide 40 Mg Tabs (Furosemide) .Marland Kitchen... Take one tablet once daily    Metoprolol Tartrate 50 Mg Tabs (Metoprolol tartrate) .Marland Kitchen... Take one tablet two times a day-hold    Plavix 75 Mg Tabs (Clopidogrel bisulfate) ..... Once daily    Warfarin Sodium 5 Mg Tabs (Warfarin sodium) .Marland Kitchen... Take as directed by coumadin clinic.    Lisinopril 5 Mg Tabs (Lisinopril) .Marland Kitchen... Take one tablet once daily  Problem # 3:  LBBB (ICD-426.3) refer to the above His updated medication list for this problem includes:    Metoprolol Tartrate 50 Mg Tabs (Metoprolol tartrate) .Marland Kitchen... Take one tablet two times a day-hold    Plavix 75 Mg Tabs (Clopidogrel bisulfate) ..... Once daily    Warfarin Sodium 5 Mg Tabs (Warfarin sodium) .Marland Kitchen... Take as directed by coumadin clinic.    Lisinopril 5 Mg Tabs (Lisinopril) .Marland Kitchen... Take one tablet once daily  Problem # 4:  ATRIAL FIBRILLATION, HX OF (ICD-V12.59) noted on His updated medication list for this  problem includes:    Metoprolol Tartrate 50 Mg Tabs (Metoprolol tartrate) .Marland Kitchen... Take one tablet two times a day-hold    Plavix 75 Mg Tabs (Clopidogrel bisulfate) ..... Once daily    Warfarin Sodium 5 Mg Tabs (Warfarin sodium) .Marland Kitchen... Take as directed by coumadin clinic.    Lisinopril 5 Mg Tabs (Lisinopril) .Marland Kitchen... Take one tablet once daily  Orders: EKG w/ Interpretation (93000)  Patient Instructions: 1)  Your physician has recommended that you have a defibrillator inserted.  An implantable cardioverter defibrillator (ICD) is a small device that is placed in your chest or, in rare cases, your abdomen. This device uses electrical pulses or shocks to help control life-threatening, irregular heartbeats that could  lead the heart to suddenly stop beating (sudden cardiac arrest). Leads are attached to the ICD that goes into your heart. This is done in the hospital and usually requires an overnight stay. Please see the instruction sheet given to you today for more information.

## 2010-09-06 NOTE — Progress Notes (Signed)
Summary: REQUEST CALL BACK ABOUT PACEMAKER  Phone Note Call from Patient Call back at Home Phone [phone]   Caller: Patient Summary of Call: PT REQUEST A CALL BACK ABOUT HIS PACEMAKER Initial call taken by: Judie Grieve,  October 12, 2009 3:42 PM  Follow-up for Phone Call        The patient is having intermittent stimulation as checked before.  He will call in the am if it continues and we'll set up an appointment for him on Thursday. Follow-up by: Altha Harm, LPN,  October 12, 2009 4:46 PM

## 2010-09-06 NOTE — Procedures (Signed)
Summary: [facility]/ [facility]   Current Medications (verified): 1)  Furosemide 40 Mg Tabs (Furosemide) .... Take One Tablet Once Daily 2)  Metoprolol Tartrate 50 Mg Tabs (Metoprolol Tartrate) .... Take One Tablet Two Times A Day-Hold 3)  Metformin Hcl 500 Mg Tabs (Metformin Hcl) .... Two Times A Day 4)  Warfarin Sodium 5 Mg Tabs (Warfarin Sodium) .Marland Kitchen.. 1 Tablet Every Other Day.  ! 1/2 Tablet On Days in Between. 5)  Lisinopril 5 Mg Tabs (Lisinopril) .... Take One Tablet Once Daily 6)  Lipitor 10 Mg Tabs (Atorvastatin Calcium) .... Once Daily 7)  Advair Diskus 100-50 Mcg/dose Aepb (Fluticasone-Salmeterol) .... Rarely 8)  Proventil Hfa 108 (90 Base) Mcg/act Aers (Albuterol Sulfate) .... As Needed 9)  Colcrys 0.6 Mg Tabs (Colchicine) .... Take As Needed 10)  Aspirin 81 Mg Tbec (Aspirin) .... Take One Tablet By Mouth Daily  Allergies (verified): No Known Drug Allergies   ICD Specifications Following MD:  Sherryl Manges, MD     Referring MD:  Anne Fu ICD Vendor:  [employer]     ICD Model Number:  [phone]     ICD Serial Number:  [id number] ICD DOI:  10/04/2009     ICD Implanting MD:  Sherryl Manges, MD  Lead 1:    Location: RA     DOI: 10/04/2009     Model #: [id number]     Serial #: [id number]     Status: active Lead 2:    Location: RV     DOI: 10/04/2009     Model #: 5631     Serial #: [id number]     Status: explanted Lead 3:    Location: LV     DOI: 10/04/2009     Model #: 1258T     Serial #: [id number]     Status: active Lead 4:    Location: RV     DOI: 11/05/2009     Model #: 4128     Serial #: [id number]     Status: active  Indications::  CM; Huston Foley   ICD Follow Up Remote Check?  No Battery Voltage:  94% V     Charge Time:  8 seconds     Battery Est. Longevity:  3.5 YEARS Underlying rhythm:  SR ICD Dependent:  No       ICD Device Measurements Atrium:  Amplitude: 3.2 mV, Impedance: 380 ohms,  Right Ventricle:  Amplitude: 10.1 mV, Impedance: 450 ohms, Threshold: 0.5 V at 0.5 msec Left Ventricle:  Impedance: 260  ohms, Threshold: 3.25 V at 0.8 msec Configuration: LV RING TO RV COIL Shock Impedance: 49 ohms   Episodes MS Episodes:  24     Percent Mode Switch:  68%     Coumadin:  Yes Shock:  0     ATP:  0     Nonsustained:  0     Atrial Pacing:  22%     Ventricular Pacing:  61%  Brady Parameters Mode DDDR     Lower Rate Limit:  60     Upper Rate Limit 110 PAV 225     Sensed AV Delay:  200  Tachy Zones VF:  200     VT:  179 (MONITOR)     Next Cardiology Appt Due:  01/04/2010 Tech Comments:  Wound check appt for repositioning of RV lead.  Wound without redness or edema and well healed. Pt in atrial flutter today with a cycle length of around .  Unsure of when last episode of atrial arrythmia  was, has been in SR since being seen at [facility].  Pt losing CRT therapy during atrial arrythmias.  Per previous notes, known afib, on Coumadin therapy.   Plan per Dr Graciela Husbands and Dr Anne Fu.  ROV 5-11 with Dr Graciela Husbands as scheduled. Gypsy Balsam RN BSN  November 22, 2009 2:48 PM

## 2010-09-06 NOTE — Miscellaneous (Signed)
Summary: Device preload  Clinical Lists Changes  Observations: Added new observation of ICD INDICATN: CM; Brady (10/06/2009 8:11) Added new observation of ICDLEADSTAT3: active (10/06/2009 8:11) Added new observation of ICDLEADSER3: [id number] (10/06/2009 8:11) Added new observation of ICDLEADMOD3: 1258T (10/06/2009 8:11) Added new observation of ICDLEADLOC3: LV (10/06/2009 8:11) Added new observation of ICDLEADSTAT2: active (10/06/2009 8:11) Added new observation of ICDLEADSER2: [id number] (10/06/2009 8:11) Added new observation of ICDLEADMOD2: 7121  (10/06/2009 8:11) Added new observation of ICDLEADLOC2: RV  (10/06/2009 8:11) Added new observation of ICDLEADSTAT1: active  (10/06/2009 8:11) Added new observation of ICDLEADSER1: [id number]  (10/06/2009 8:11) Added new observation of ICDLEADMOD1: 2088TC  (10/06/2009 8:11) Added new observation of ICDLEADLOC1: RA  (10/06/2009 8:11) Added new observation of ICD IMP MD: Sherryl Manges, MD  (10/06/2009 8:11) Added new observation of ICDLEADDOI3: 10/04/2009  (10/06/2009 8:11) Added new observation of ICDLEADDOI2: 10/04/2009  (10/06/2009 8:11) Added new observation of ICDLEADDOI1: 10/04/2009  (10/06/2009 8:11) Added new observation of ICD IMPL DTE: 10/04/2009  (10/06/2009 8:11) Added new observation of ICD SERL#: [id number]  (10/06/2009 8:11) Added new observation of ICD MODL#: [id number]  (10/06/2009 8:41) Added new observation of ICDMANUFACTR: [employer] Jude  (10/06/2009 8:11) Added new observation of ICD MD: Sherryl Manges, MD  (10/06/2009 8:11)       ICD Specifications Following MD:  Sherryl Manges, MD     ICD Vendor:  [employer]     ICD Model Number:  [phone]     ICD Serial Number:  [id number] ICD DOI:  10/04/2009     ICD Implanting MD:  Sherryl Manges, MD  Lead 1:    Location: RA     DOI: 10/04/2009     Model #: [id number]     Serial #: [id number]     Status: active Lead 2:    Location: RV     DOI: 10/04/2009     Model #: [id number]     Serial #: [id number]     Status:  active Lead 3:    Location: LV     DOI: 10/04/2009     Model #: 1258T     Serial #: [id number]     Status: active  Indications::  CM; Huston Foley

## 2010-09-06 NOTE — Letter (Signed)
Summary: Jared Landry Physician OV w/out Gyn History  [facility]ician OV w/out Gyn History   Imported By: Roderic Ovens 10/28/2009 11:29:11  _____________________________________________________________________  External Attachment:    Type:   Image     Comment:   External Document

## 2010-09-06 NOTE — Letter (Signed)
Summary: Implantable Device Instructions  [facility], Main Office  [address]   [place], [place] [address]   Phone: [phone]  Fax: [phone]      Implantable Device Instructions  You are scheduled for:  _____ Permanent Transvenous Pacemaker __x___ Implantable Cardioverter Defibrillator _____ Implantable Loop Recorder _____ Generator Change  on Sep 10, 2009 with Dr. Graciela Husbands.  1.  Please arrive at the Short Stay Center at [facility] at 1000 on the day of your procedure.  2.  Do not eat or drink the night before your procedure.  3.  Complete lab work on 09/06/09.  The lab at [facility] is open from 8:30 AM to 1:30 PM and from 2:30 PM to 5:00 PM.  The lab at [facility] is open from 7:30 AM to 5:30 PM.  You do not have to be fasting.  4.  Only take 1/2 of your Metformin the night before your procedure and NONE the morning of your procedure. Do NOT take your Furosemide the morning of your procedure. You may take your other meds with a sip of water the morning of your procedure.  5.  Plan for an overnight stay.  Bring your insurance cards and a list of your medications.  6.  Wash your chest and neck with antibacterial soap (any brand) the evening before and the morning of your procedure.  Rinse well.  7.  Education material received:     Pacemaker _____           ICD __x___           Arrhythmia _____  *If you have ANY questions after you get home, please call the office [phone].  *Every attempt is made to prevent procedures from being rescheduled.  Due to the nauture of Electrophysiology, rescheduling can happen.  The physician is always aware and directs the staff when this occurs.    Appended Document: Implantable Device Instructions Same instructions. Date changed to 10/01/09 Labs will be on 09/24/09

## 2010-09-06 NOTE — Progress Notes (Signed)
Summary: CALLING BACK ABOUT HIS PACEMAKER  Phone Note Call from Patient Call back at Home Phone [phone]   Caller: Patient Summary of Call: PT  REQUEST A CALL BACK ABOUT HIS PACEMAKER, PT CHANGED HIS MIND WANT TO BE SEEN TODAY. Initial call taken by: Judie Grieve,  October 08, 2009 1:28 PM  Follow-up for Phone Call        Pt called back, wanted to be seen today, arranged for industry to check patient at [facility] today. Gypsy Balsam RN BSN  October 08, 2009 2:09 PM

## 2010-09-06 NOTE — Cardiovascular Report (Signed)
Summary: Pre Op Orders  Pre Op Orders   Imported By: Roderic Ovens 09/07/2009 16:17:47  _____________________________________________________________________  External Attachment:    Type:   Image     Comment:   External Document

## 2010-09-06 NOTE — Assessment & Plan Note (Signed)
Summary: 1WK F/U PER PAULA OK PER GESLIA   Primary Provider:  Dr. Bryson Corona  CC:  1 week follow up.  Pt states he feels okay.  Marland Kitchen  History of Present Illness: Jared Landry is seen following CRT-D implantation implanted for congestive heart failure in the setting of coronary artery disease atrial fibrillation and diabetes.  the procedure has been complicated by diaphragmatic stimulation related to right ventricular stimulation which was solvable only by turning off the RV lead. Patient's initial impression was that his breathing was better with biventricular pacing on and has been less good with LV only pacing.  Hypertension History:      Positive major cardiovascular risk factors include male age 22 years old or older, diabetes, and current tobacco user.        Positive history for target organ damage include ASHD (either angina/prior MI/prior CABG) and cardiac end organ damage (either CHF or LVH).     Current Medications (verified): 1)  Furosemide 40 Mg Tabs (Furosemide) .... Take One Tablet Once Daily 2)  Metoprolol Tartrate 50 Mg Tabs (Metoprolol Tartrate) .... Take One Tablet Two Times A Day-Hold 3)  Plavix 75 Mg Tabs (Clopidogrel Bisulfate) .... Once Daily 4)  Metformin Hcl 500 Mg Tabs (Metformin Hcl) .... Two Times A Day 5)  Warfarin Sodium 5 Mg Tabs (Warfarin Sodium) .Marland Kitchen.. 1 Tablet Every Other Day.  ! 1/2 Tablet On Days in Between. 6)  Lisinopril 5 Mg Tabs (Lisinopril) .... Take One Tablet Once Daily 7)  Lipitor 10 Mg Tabs (Atorvastatin Calcium) .... Once Daily 8)  Advair Diskus 100-50 Mcg/dose Aepb (Fluticasone-Salmeterol) .... Rarely 9)  Proventil Hfa 108 (90 Base) Mcg/act Aers (Albuterol Sulfate) .... As Needed 10)  Colcrys 0.6 Mg Tabs (Colchicine) .... Take As Needed  Allergies (verified): No Known Drug Allergies  Past History:  Past Medical History: Last updated: Sep 23, 2009 Atrial fibrillation coronary artery disease s/p drug eluting stent type II  diabetes Hypertension Gout Osteoarthritis COPD  Past Surgical History: Last updated: 09/23/09 appendectomy T & A  vasectomy Surgical I & D after  brown recluse Inner thigh  Family History: Last updated: 2009/09/23 Mother CHF died at 36 yrs Sister-alive DM Father deceased due to ruptured appendix: age 87  Social History: Last updated: 09/23/2009 Part Time-commercial real estate Tobacco Use - Yes. -1/3 ppd Alcohol Use - yes-daily consumption 2-3 shots daily Drug Use - no  Vital Signs:  Patient profile:   70 year old male Height:      69 inches Weight:      198 pounds BMI:     29.35 Pulse rate:   61 / minute Pulse rhythm:   regular BP sitting:   124 / 72  (left arm) Cuff size:   large  Vitals Entered By: Judithe Modest CMA (October 20, 2009 11:52 AM)  Physical Exam  General:  The patient was alert and oriented in no acute distress. HEENT Normal.  Neck veins were flat, carotids were brisk.  Lungs were clear.  Heart sounds were regular without murmurs or gallops.  Abdomen was soft with active bowel sounds. There is no clubbing cyanosis or edema. Skin Warm and dry     ICD Specifications Following MD:  Sherryl Manges, MD     ICD Vendor:  [facility] Jude     ICD Model Number:  [id number]     ICD Serial Number:  [id number] ICD DOI:  10/04/2009     ICD Implanting MD:  Sherryl Manges, MD  Lead 1:  Location: RA     DOI: 10/04/2009     Model #: [id number]     Serial #: [id number]     Status: active Lead 2:    Location: RV     DOI: 10/04/2009     Model #: 9562     Serial #: [id number]     Status: active Lead 3:    Location: LV     DOI: 10/04/2009     Model #: 1258T     Serial #: [id number]     Status: active  Indications::  CM; Huston Foley   ICD Follow Up Remote Check?  No Battery Voltage:  95% V     Charge Time:  8.0 seconds     Battery Est. Longevity:  7.5 YEARS Underlying rhythm:  SR ICD Dependent:  No       ICD Device Measurements Atrium:  Amplitude: 2.5 mV, Impedance: 380 ohms,  Threshold: 0.875 V at 0.5 msec Right Ventricle:  Amplitude: 5.7 mV, Impedance: 590 ohms, Threshold: 2.75 V at 0.4 msec Left Ventricle:  Impedance: 330 ohms,  Configuration: LV TIP TO RV COIL Shock Impedance: 42 ohms   Episodes MS Episodes:  7     Percent Mode Switch:  <1%     Coumadin:  No Shock:  0     ATP:  0     Nonsustained:  0     Atrial Pacing:  76%     Ventricular Pacing:  76%  Brady Parameters Mode DDDR     Lower Rate Limit:  60     Upper Rate Limit 110 PAV 225     Sensed AV Delay:  200  Tachy Zones VF:  200     VT:  179     Impression & Recommendations:  Problem # 1:  DIAPHRAGMATIC STIMULATION VIA RV LEAD (ICD-996.04) This likely reflects microperforation of the RV lead.  we will reposition this lead given this and that his symptoms seem to have worsened following inactivation of the RV lead.  the patient has had reviewed the potential risks including but not limited to infection and cardiac bleeding he understands these risks and is willing to proceed  Problem # 2:  SYSTOLIC HEART FAILURE, CHRONIC (ICD-428.22) as above His updated medication list for this problem includes:    Furosemide 40 Mg Tabs (Furosemide) .Marland Kitchen... Take one tablet once daily    Metoprolol Tartrate 50 Mg Tabs (Metoprolol tartrate) .Marland Kitchen... Take one tablet two times a day-hold    Plavix 75 Mg Tabs (Clopidogrel bisulfate) ..... Once daily    Warfarin Sodium 5 Mg Tabs (Warfarin sodium) .Marland Kitchen... 1 tablet every other day.  ! 1/2 tablet on days in between.    Lisinopril 5 Mg Tabs (Lisinopril) .Marland Kitchen... Take one tablet once daily  Orders: Bi-V ICD (Bi-V ICD)  Problem # 3:  ATRIAL FIBRILLATION, HX OF (ICD-V12.59) will hold comumadin in anticipation ofthe  eprocedure His updated medication list for this problem includes:    Metoprolol Tartrate 50 Mg Tabs (Metoprolol tartrate) .Marland Kitchen... Take one tablet two times a day-hold    Plavix 75 Mg Tabs (Clopidogrel bisulfate) ..... Once daily    Warfarin Sodium 5 Mg Tabs (Warfarin  sodium) .Marland Kitchen... 1 tablet every other day.  ! 1/2 tablet on days in between.    Lisinopril 5 Mg Tabs (Lisinopril) .Marland Kitchen... Take one tablet once daily  Hypertension Assessment/Plan:      The patient's hypertensive risk group is category C: Target organ damage and/or diabetes.  Today's blood  pressure is 124/72.    Patient Instructions: 1)  You have been scheduled for a lead revision.  Please see the instruction sheet given to you today. 2)  Your physician recommends that you return for lab work on March 25th, 2011.

## 2010-09-08 NOTE — Letter (Signed)
Summary: Remote Device Check  [employer], Main Office  [address]   [place], [place] [address]   Phone: [phone]  Fax: [phone]     August 17, 2010 MRN: [id number]   IVON OELKERS [address] [place], [place]  [address]   Dear Mr. Kinley,   Your remote transmission was recieved and reviewed by your physician.  All diagnostics were within normal limits for you.  __X___Your next transmission is scheduled for:   10-13-2010.  Please transmit at any time this day.  If you have a wireless device your transmission will be sent automatically.   Sincerely,  Vella Kohler

## 2010-09-08 NOTE — Cardiovascular Report (Signed)
Summary: Office Visit Remote   Office Visit Remote   Imported By: Roderic Ovens 08/19/2010 11:02:03  _____________________________________________________________________  External Attachment:    Type:   Image     Comment:   External Document

## 2010-09-08 NOTE — Progress Notes (Signed)
Summary: calling re results of pacemaker check  Phone Note Call from Patient   Caller: Patient [phone] Reason for Call: Talk to Nurse Summary of Call: pt calling re to have phone check for pacemaker-wants results Initial call taken by: Glynda Jaeger,  August 09, 2010 11:53 AM  Follow-up for Phone Call        spoke to pt about transmission. letter to be sent with next phone check. Vella Kohler  August 09, 2010 12:13 PM

## 2010-09-08 NOTE — Letter (Signed)
Summary: Remote Device Check  [employer], Main Office  [address]   [place], [place] [address]   Phone: [phone]  Fax: [phone]     August 17, 2010 MRN: [id number]   Jared Landry [address] [place], [place]  [address]   Dear [name],   Your remote transmission was recieved and reviewed by your physician.  All diagnostics were within normal limits for you.  _____Your next transmission is scheduled for:                       .  Please transmit at any time this day.  If you have a wireless device your transmission will be sent automatically.  ______Your next office visit is scheduled for:                              . Please call our office to schedule an appointment.    Sincerely,  Vella Kohler

## 2010-10-13 ENCOUNTER — Encounter (INDEPENDENT_AMBULATORY_CARE_PROVIDER_SITE_OTHER): Payer: Medicare Other

## 2010-10-13 DIAGNOSIS — I428 Other cardiomyopathies: Secondary | ICD-10-CM

## 2010-10-15 ENCOUNTER — Encounter: Payer: Self-pay | Admitting: Internal Medicine

## 2010-10-20 ENCOUNTER — Encounter: Payer: Self-pay | Admitting: *Deleted

## 2010-10-25 NOTE — Letter (Signed)
Summary: Remote Device Check  [employer], Main Office  [address]   [place], [place] [address]   Phone: [phone]  Fax: [phone]     October 20, 2010 MRN: [id number]   Jared Landry [address] [place], [place]  [address]   Dear Mr. Foland,   Your remote transmission was recieved and reviewed by your physician.  All diagnostics were within normal limits for you.  ___X__Your next transmission is scheduled for: 01/12/2011.   Please transmit at any time this day.  If you have a wireless device your transmission will be sent automatically.  ______Your next office visit is scheduled for:                              . Please call our office to schedule an appointment.    Sincerely,  Altha Harm, LPN

## 2010-10-26 LAB — GLUCOSE, CAPILLARY
Glucose-Capillary: 86 mg/dL (ref 70–99)
Glucose-Capillary: 110 mg/dL — ABNORMAL HIGH (ref 70–99)

## 2010-10-26 LAB — PROTIME-INR
INR: 1.81 — ABNORMAL HIGH (ref 0.00–1.49)
Prothrombin Time: 20.2 seconds — ABNORMAL HIGH (ref 11.6–15.2)

## 2010-11-03 NOTE — Cardiovascular Report (Signed)
Summary: Office Visit   Office Visit   Imported By: Roderic Ovens 10/25/2010 09:12:24  _____________________________________________________________________  External Attachment:    Type:   Image     Comment:   External Document

## 2010-12-20 NOTE — H&P (Signed)
NAMEJAYKUB, MACKINS NO.:  [id number]   MEDICAL RECORD NO.:  [id number]          PATIENT TYPE:  INP   LOCATION:  2024                         FACILITY:  [facility]   PHYSICIAN:  Kela Millin, M.D.DATE OF BIRTH:  [birth year]   DATE OF ADMISSION:  10/16/2007  DATE OF DISCHARGE:                              HISTORY & PHYSICAL   PRIMARY CARE PHYSICIAN:  Georgann Housekeeper, M.D.   CHIEF COMPLAINT:  Shortness of breath.   HISTORY OF PRESENT ILLNESS:  The patient is a 70 year old white male  with past medical history significant for emphysema/COPD, diabetes,  hypertension, history of syncope.  Has worked up out to the patient by  Dr. Pearlean Brownie, and also was referred to Dr. Anne Fu, who presents with the  above complaints.  He states that he has had worsening shortness of  breath for the past 1 day.  He admits to orthopnea and ? PND.  He also  admits to the chest discomfort, described as a pressure that lasted most  of last night, 4/10 in intensity at its worst, and midsternal in  location.  No radiation.  He states he had associated nausea but no  vomiting.  Per nursing staff he was diaphoretic upon arrival in the ER.  He denies leg swelling.Marland Kitchen  He states that he recently was treated for  bronchitis by his primary care physician, and his cough had actually  improved since then.  He denies fevers, dysuria, melena, and no  hematochezia.   The patient was seen in the ER, and it was noted that he was unable to  speak in complete sentences initially secondary to his shortness of  breath.  He was given nebulized bronchodilators, IV steroids, as well as  IV Lasix, and also placed on BiPAP per ER physician.  Following this, he  improved, and the BiPAP was discontinued.  A chest x-ray was done which  showed cardiomegaly, pulmonary vascular congestion without overt edema,  his brain natruretic peptide elevated at 1146.  He is admitted for  further evaluation and management.  The patient  states that he had been  wearing a Holter monitor as part of the workup for his syncopal episode  about a month ago, but the battery ran out.  The patient also reports  that Dr. Pearlean Brownie had called him to tell him that the Holter had fired and  that his heart was going fast at a certain point.  In the ER, initial  EKG was done, and findings were consistent with she atrial fibrillation.  A repeat EKG showed possible ectopic atrial rhythm, with occasional  PVCs, and his rate was in the 80s. (As above, he had an appointment  scheduled to see Dr. Anne Fu on Monday.).   PAST MEDICAL HISTORY:  As above.   MEDICATIONS:  1. Diltiazem SR 180 mg daily.  2. Metformin 500 mg b.i.d.  3. Advair  4. He said he was previously on lisinopril, but this was stopped after      his syncopal episode.   ALLERGIES:  NKDA.   SOCIAL HISTORY:  Positive for tobacco.  He states that he drinks a  bottle of red wine most days of the week.  He denies any history of DTs.   FAMILY HISTORY:  His mother had diabetes, a heart problem, and had a  pacemaker.   REVIEW OF SYSTEMS:  As per HPI.  Other review of systems negative.   PHYSICAL EXAMINATION:  GENERAL:  The patient is an elderly white male,  in no respiratory distress, with nasal cannula oxygen on.  VITAL SIGNS:  Temperature is 97.5, his blood pressure is 100/80,  initially 154/120, his pulse is 94, initially 102, respiratory rate is  20, initially 38, O2 saturation 94%.  HEENT:  PERRL, EOMI.  Sclerae anicteric.  Moist mucous membranes.  NECK:  Supple, no adenopathy, no thyromegaly, and no JVD.  No carotid  bruits appreciated.  LUNGS:  Few expiratory wheezes.  Decreased air movement bilaterally.  CARDIOVASCULAR:  Regular.  Normal S1, S2, no S3 appreciated.  ABDOMEN:  Soft, bowel sounds present, nontender, nondistended.  No  organomegaly, and no masses palpable.  EXTREMITIES:  Trace left ankle edema, no cyanosis.  NEURO:  He is alert and oriented x3.  Cranial  nerves II-XII grossly  intact.  Nonfocal exam.   ASSESSMENT AND PLAN:  1. Congestive heart failure.  Patient presenting with shortness of      breath and orthopnea/? PND initially, with an elevated brain      natruretic peptide of 1146, and also it is noted that his initial      blood pressure was markedly elevated at 154/120.  Will place on IV      Lasix, obtain serial cardiac enzymes, also a 2D echocardiogram.      Follow, and consider a cardiac consult pending enzymes.  2. Chronic obstructive pulmonary disease exacerbation.  Antibiotics,      nebulizers, Solu-Medrol, and follow.  3. Question atrial fibrillation.  As per HPI, will obtain cardiac      enzymes, 2D echocardiogram as above.  Will place on therapeutic-      dose Lovenox for now.  Continue diltiazem, follow, and further      manage as appropriate.  4. History of syncope.  As noted above, outpatient workup by Dr.      Pearlean Brownie.  The patient was also to see Dr. Anne Fu as an outpatient.      Consult cardiology in-house as appropriate.  5. Diabetes.  Will hold metformin for now secondary to #1, sliding      scale, treat with Lantus and follow.  6. Uncontrolled hypertension.  Blood pressures improved following      diuresis in the ER.  Continue outpatient medications.      Kela Millin, M.D.  Electronically Signed     ACV/MEDQ  D:  10/17/2007  T:  10/17/2007  Job:  [id number]   cc:   Georgann Housekeeper, MD  Jake Bathe, MD  Pramod P. Pearlean Brownie, MD

## 2010-12-20 NOTE — Consult Note (Signed)
NAME:  Jared, Landry NO.:  [id number]   MEDICAL RECORD NO.:  [id number]          PATIENT TYPE:  INP   LOCATION:  2024                         FACILITY:  [facility]   PHYSICIAN:  Jake Bathe, MD      DATE OF BIRTH:  November 26, 1940   DATE OF CONSULTATION:  10/17/2007  DATE OF DISCHARGE:                                 CONSULTATION   REFERRING PHYSICIAN:  Georgann Housekeeper, MD   REASON FOR CONSULTATION:  Mr. Chesnut is being seen at the request of Dr.  Donette Larry for the evaluation of shortness of breath, atrial fibrillation,  nonsustained ventricular tachycardia, and prior syncope.   HISTORY OF PRESENT ILLNESS:  A 70 year old male with diabetes,  hypertension, alcohol use, emphysema, COPD, recently admitted with  orthopnea, progressive dyspnea over the past 2 to 3 days requiring to  sit up to sleep.  Denies any chest pain or palpitations.  Approximately  1 month ago, he was in the parking lot, legs felt weak, and he passed  out briefly.  Denies any palpitations prior.  He was seen by Dr. Pearlean Brownie.  Neurologic evaluation was performed which included MRI which he states  only showed atrophy, carotid Dopplers which were normal.   He did have a 21-day event monitor which is not complete but some of the  information does show paroxysmal atrial fibrillation with rapid  ventricular response as fast as in the 150s, and he also had one episode  of nonsustained ventricular tachycardia at a rate of 147 beats per  minute.  This monitor was read by Dr. Lewayne Bunting.  The VT was seen on  October 07, 2007, at 11 p.m.   He was supposed to see me in clinic soon for consultation regarding  these issues.   PAST MEDICAL HISTORY:  1. COPD, on no home O2.  2. Hypertension.  3. Syncope - since then has been off his lisinopril, stopped by Dr.      Donette Larry.  4. Diabetes, on metformin.  5. Gout.  6. Alcohol use.  7. Tobacco use.  8. Saw Dr. Fraser Din, had a stress test which was abnormal and had a  subsequent catheterization 4 years ago.  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   PAST SURGICAL HISTORY:  No significant past surgical history.   ALLERGIES:  No known drug allergies.   MEDICATIONS:  1. Aspirin 81 mg a day.  2. Diltiazem CD 180 mg once a day which he has been taking for 3      years.  3. Lovenox, AFib dosing q.12 hours.  4. Advair.  5. Lasix 40 mg IV b.i.d.  6. Mucinex.  7. Atrovent.  8. Xopenex.  9. Solu-Medrol 40 mg q.12.  10.Protonix 40 mg a day.  11.Off his lisinopril after a syncopal episode.   SOCIAL HISTORY:  He has been smoking 2 packs per day for 50 years,  drinks approximately a  bottle of wine daily.  No illicit drug use.  Married.   FAMILY HISTORY:  No early family history of coronary artery disease.  Mother had diabetes and a pacemaker.   REVIEW OF SYSTEMS:  Occasional gout.  No bleeding.  Unless specified  above, all other 12 review of systems is negative.   PHYSICAL EXAMINATION:  VITAL SIGNS:  Temperature 97.6, pulse 82,  respirations 20, blood pressure currently 124/81, was elevated on  admission, saturating 95% on 2 liters.  GENERAL:  Alert and oriented x3 in no acute distress, sitting in his  chair, comfortable.  EYES:  Injected sclerae.  EOMI.  NECK:  Supple.  No carotid bruits.  No JVD appreciated.  CARDIOVASCULAR:  Irregularly irregular rhythm, distant heart sounds.  No  murmurs, rubs, or gallops noted.  LUNGS:  Clear to auscultation bilaterally with prolonged expiratory  phase noted.  EXTREMITIES:  Trace edema bilateral lower extremities, 2+ femoral pulse,  no bruits.  NEUROLOGIC:  Nonfocal, normal gait.  No tremors.  SKIN:  Warm, dry, and intact.  No rashes.   DATA:  ECG from March 12 at 3 a.m. shows sinus rhythm at rate 84, left  bundle branch block, PVC, PAC.  On March 11, the EKG shows atrial  fibrillation, rapid  ventricular response rate 110 with aberrancy.  Telemetry does show paroxysmal atrial fibrillation, currently rate  controlled.  Labs:  BNP was 1146, now 1553; total cholesterol 137; HDL  62; LDL 68; triglycerides 33.  Sodium 137, potassium 4.3, chloride 99,  CO2 30, BUN 14, creatinine 0.84, glucose 179.  First set of cardiac  biomarkers were normal.  White count 11.7, hemoglobin of 16.9,  hematocrit 50.2, platelet 224,000.   Chest x-ray personally viewed showed pulmonary vascular congestion and  some evidence of cardiomegaly.  A 21-day event monitor as described  above showing nonsustained VT 13 beats, as well as paroxysmal atrial  fibrillation, rapid ventricular response.   ASSESSMENT/PLAN:  A 70 year old male with orthopnea, dyspnea, recent  syncope, nonsustained ventricular tachycardia, atrial fibrillation,  diabetes, hypertension, tobacco use, alcohol use.  1. Atrial fibrillation - currently rate controlled on his Cardizem,      checking echocardiogram.  We will check TSH.  Alcohol exacerbating      factor.  With his nonsustained ventricular tachycardia recently and      worsening orthopnea,wonder if his ejection fraction is decreased      either from ischemia versus alcohol-induced cardiomyopathy versus      tachycardia-induced cardiomyopathy, echocardiogram will be helpful.      Continue Lovenox therapeutic dose.  If ejection fraction is low,      may try beta blocker knowing that he does have chronic obstructive      pulmonary disease and while he is in a monitored setting, watch for      bronchospasm as beta blockers would be helpful in the setting of      cardiomyopathy.  Also, we will discuss long-term Coumadin use.  2. Nonsustained ventricular tachycardia - worrisome on event monitor,      especially in the setting of recent syncope and worsening      orthopnea.  Discussed the possibility of further ischemic      evaluation and possible cardiac catheterization.  Prior  cardiac      catheterization 4 years ago showed some anomaly which I will      investigate further.  Creatinine normal.  3. Dyspnea - may be left ventricular systolic dysfunction or atrial  fibrillation, rapid ventricular response.  Diuresing well on Lasix      IV 20 mg twice daily.  Echocardiogram will be helpful.  4. Diabetes - insulin sliding scale, holding metformin.  5. Emphysema, COPD - Spiriva, Xopenex.  He is receiving Solu-Medrol      currently.   We will follow along with you.      Jake Bathe, MD  Electronically Signed     MCS/MEDQ  D:  10/17/2007  T:  10/18/2007  Job:  [id number]   cc:   Georgann Housekeeper, MD

## 2010-12-20 NOTE — Cardiovascular Report (Signed)
NAMEEMMANUEL, GRUENHAGEN NO.:  [id number]   MEDICAL RECORD NO.:  [id number]          PATIENT TYPE:  INP   LOCATION:  2024                         FACILITY:  [facility]   PHYSICIAN:  Jake Bathe, MD      DATE OF BIRTH:  28-Aug-1940   DATE OF PROCEDURE:  10/18/2007  DATE OF DISCHARGE:                            CARDIAC CATHETERIZATION   PROCEDURE:  1. Left heart catheterization.  2. Selective coronary angiography.  3. Left ventriculogram.   INDICATIONS:  A 70 year old male with diabetes; hypertension; newly  discovered atrial fibrillation; nonsustained VT, 13 beats; recent  syncope; and LV systolic dysfunction, acute with ejection fraction 30 to  35% with anterior wall akinesis/dyskinesis, mid to distal.  Patient of  Dr. Tyson Dense.  Also has COPD.   PROCEDURE DETAILS:  Informed consent was obtained.  Risk of stroke,  heart attack, death, and bleeding as well as renal impairment were  relayed to the patient.  He was placed on the catheterization table,  prepped in a sterile fashion.  Lidocaine 1% was used to infiltrate the  right groin.  Femoral head was visualized under fluoroscopic guidance.  Using the modified Seldinger technique, a 6-French sheath was placed  into the right femoral artery under fluoroscopic guidance.  There was  some noted calcification surrounding the femoral artery and careful  consideration was made with fluoroscopy.  A Judkins left #4 catheter was  then used to selectively cannulate the left main artery.  Multiple views  with Omnipaque were obtained.  This catheter was exchanged for a Judkins  right #4 catheter used to selectively cannulate the right coronary  artery.  Multiple views with Omnipaque were obtained.  This catheter was  exchanged for an angled pigtail which was used to cross the aortic valve  and the left ventricle.  Hemodynamics were obtained.  A left  ventriculogram in the RAO position using 36 mL of dye was then performed  with power injection.  This catheter was then brought across the aortic  valve and gradient obtained.   FINDINGS:  1. Left main artery - Minor irregularity, minor calcification,      branches into the LAD and circumflex artery.  2. Left anterior descending artery - Large-caliber vessel with 2 large      diagonal branches.  Quite tortuous.  There is heavy calcification      in the proximal and mid section.  There is a hazy 80% lesion,      highly calcified at the first diagonal and a second lesion distal      to that which is 70% at the second diagonal.  That entire region is      diffusely diseased.  The first and second diagonal ostially also      have 50 to 70% stenosis.  This is progressed from prior cardiac      catheterization in 2004.  3. Circumflex artery - Large tortuous vessel with 3 large OM branches.      There is a 10% ostial disease.  Otherwise, no angiographically      significant disease.  4. Right coronary artery - Large dominant vessel, 30% to 40% distal      RCA stenosis with calcification surrounding this region.      Otherwise, minor irregularities.  5. Left ventriculogram - Ejection fraction 30% to 35% with anterior      wall mid to distal dyskinesis.  Generalized decreased contraction.  6. Hemodynamics, left ventricle systolic 108, left ventricular end-      diastolic pressure 16 mmHg, aortic pressure 108/64 with a mean of      80 mmHg.  There was no gradient.   IMPRESSION:  1. 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  Progression from previous      catheterization.  2. Right coronary artery with 30% to 40% calcified plaque and stenosis      in the distal segment.  3. Severely decreased left ventricular systolic function with anterior      mid to distal akinesis/dyskinesis.  Ejection fraction approximately      30% to 35%.  Left  ventricular end-diastolic pressure 15 mmHg.   PLAN:  Findings were discussed with Dr. Verdis Prime who will plan  coronary intervention.  Findings were discussed with both patient and  his wife.      Jake Bathe, MD  Electronically Signed     MCS/MEDQ  D:  10/18/2007  T:  10/18/2007  Job:  [id number]   cc:   Georgann Housekeeper, MD

## 2010-12-20 NOTE — Cardiovascular Report (Signed)
NAME:  Jared Landry, Jared Landry NO.:  [id number]   MEDICAL RECORD NO.:  [id number]          PATIENT TYPE:  INP   LOCATION:  6522                         FACILITY:  [facility]   PHYSICIAN:  Lyn Records, M.D.   DATE OF BIRTH:  Dec 29, 1940   DATE OF PROCEDURE:  10/18/2007  DATE OF DISCHARGE:                            CARDIAC CATHETERIZATION   INDICATIONS FOR PROCEDURE:  Left ventricular dysfunction; high-grade mid  LAD lesions; recurrent chest tightness, possibly ischemic; and  significant other comorbidities including COPD and diabetes.   PROCEDURE PERFORMED:  Drug-eluting stent Cypher stent left anterior  descending with rescue of first and second diagonal branches and with  final kissing balloon at the left anterior descending/diagonal #1  bifurcation site.   DESCRIPTION:  The case was completed diagnostically by Dr. Anne Fu.  After reviewing the digital images, he requested consultation with  reference to  stenting the LAD lesion given the patency of the  circumflex and right coronaries, the mid LAD location,  and the  patient's significant left ventricular dysfunction and possible previous  anteroapical infarction.   After reviewing the images, we decided to proceed understanding that  this was a higher risk than usual procedure and made complicated by  the  presence of 2 large side branches that would be placed in stent jail  jeopardy of closing.   We embarked upon this complicated procedure understanding that we will  need to protect both diagonals.  For that reason, we upgraded to a 7-  French-based catheter system.  We used CLS 4.0 guiding system.  The  patient was given a bolus of 0.75 mg/kg of bivalirudin and then 1.75  mg/kg per hour infusion was begun.  ACT was documented to be greater  than 250.  The  patient had been previously loaded with 600 mg of  Plavix.  We used 3 guidewires.  We used a Luge wire in the first  diagonal.  We used an Office manager  down the LAD proper.  We used  a BMW wire in the second diagonal.  We predilated the LAD over the [facility] wire with a 3.0 x 15 mm long Maverick balloon.  This caused  threading closure of the second diagonal.  We dilated this vessel with a  2.0 x 9 mm long Maverick balloon.  This restored patency.  We then  attempted to pass a 23 x 3.0 mm long Cypher stent but were unsuccessful  in getting the stent to completely traverse the lesion.  We then removed  the stent and got a 12-mm long noncompliant Quantum balloon and  postdilated the distal portion of the LAD lesion to 15 atmospheres.  We  then deployed the Cypher stent after properly positioning it in the LAD.  The only way to position the stent in the LAD, however, was by removing  the guidewire that was in the second diagonal  and using that down the  LAD as a buddy wire.  After properly placing the stent, we removed the  BMW wire and deployed the stent to 13 atmospheres.  Two balloon  inflations were performed.  We then focused our attention on properly  expanding the stent.  We attempted to pass the 3.25 x 12 mm long Quantum  that had been previously used in predilatation.  This balloon would not  pass down the vessel.  We then removed the Luge wire from the diagonal  and attempted to pass this wire down the LAD through the stent as I felt  the post dilation balloon would not track because of wire wrapping.  We  were unsuccessful at traversing the stent with a Luge wire.  We  eventually removed the Luge wire and placed a new BMW wire down the LAD,  first into the first diagonal where we did a balloon inflation with a  2.0 x 9 mm Maverick to open the cells of the stent overlying the first  diagonal.  We then retrieved the diagonal BMW wire and placed it down  the LAD.  We were then able to traverse the stent with the 3.25 x 12 mm  long Quantum balloon.  We did a balloon inflation distally and  proximally with this balloon each to  17 atmospheres.  We then retrieved  the BMW wire and placed it in the first diagonal.  We then used 2 new  balloons to perform a final kissing balloon in the proximal stent with a  2.0 x 9 mm Maverick balloon previously used in the diagonal over the BMW  wire and a new 9  mm long x 4.0 mm diameter Quantum balloon in the LAD  over the [employer] wire.  Two kissing balloon inflations were  performed, LAD balloon to 13 atmospheres, diagonal balloon to 8  atmospheres.  We were careful to properly align  these balloons at the  proximal stent margin not extending into the LAD proximal.  Postdilatation images demonstrated patent second and first diagonals.  The ostia of both diagonals was slightly pinched, but flow was good.  The final angiographic image demonstrated slightly reduced flow in the  LAD but no angiographic abnormality could be identified, and the patient  was asymptomatic.  The case was terminated, and the patient taken to the  holding area where hemostasis will be achieved with manual compression  after bivalirudin has been discontinued for at least 2 hours.   CONCLUSION:  Successful but very complicated mid LAD intervention with  drug-eluting stent implantation in the LAD within a heavily calcified  segment reducing a segmental 80% to 85% stenosis to 0.  This procedure  involved the use of 3 guidewires throughout the bulk of the procedure to  protect 2 large diagonal branches that arose within the lesion and also  to secure the native vessel.  Balloon dilatation to rescue both side  branches was necessary during the procedure.  A final kissing balloon  was performed in the first diagonal and proximal portion of the stent  before completion.   PLAN:  Aspirin and Plavix for at least a year.  Aspirin should be 81 mg  per day given the likely need for chronic Coumadin therapy in this  patient who has atrial fibrillation.      Lyn Records, M.D.  Electronically  Signed     HWS/MEDQ  D:  10/18/2007  T:  10/18/2007  Job:  [id number]   cc:   Jake Bathe, MD  Georgann Housekeeper, MD

## 2010-12-20 NOTE — Assessment & Plan Note (Signed)
[facility]                                 ON-CALL NOTE   Jared Landry, Jared Landry                      MRN:          [id number]  DATE:10/15/2007                            DOB:          [birth year]    PRIMARY CARE PHYSICIAN:  Georgann Housekeeper, MD   NEUROLOGIC:  Pramod P. Pearlean Brownie, MD   [facility]   HISTORY:  [name] is a 70 year old male who was initially evaluated by  his primary care physician for syncope and referred to Dr. Pearlean Brownie.  Dr.  Pearlean Brownie then contacted Dr. Olga Millers office to arrange event  monitoring. Apparently, a monitor was placed on February 21.  [employer]  calls this evening stating that he has had 11 beats of ventricular  tachycardia.  However, on reviewing the strips it appears to me he may  have bigeminy.  There is a lot of artifact within the strips sent. I  contacted [name] at home at the phone number [phone] provided by  [employer] as that I could not find any information in E-chart that Dr.  Ladona Ridgel was actively seeing the patient.  According to [name], he  states that Dr. Ladona Ridgel is not his cardiologist, his cardiologist is with  the [facility] practice.  He does not know the cardiologist name that he will  be following up with.  [name] denies any recent episodes of syncope  and he denies any problems with palpitations.  I informed him that his  heart monitor did show some irregularities, but I did not see any  evidence that he needed to come to the hospital at this time.  However,  I advised him if he did have any further episodes of syncope or he  develops palpitations with any symptoms that may be related to chest  discomfort or shortness of breath, dizziness that he should contact his  physicians and proceed to the emergency room..   On Wednesday morning I will contact our office as well as [place] office  to assure that the proper physicians have been receiving the [employer]  strips for  review.      Joellyn Rued, PA-C  Electronically Signed      Doylene Canning. Ladona Ridgel, MD  Electronically Signed   EW/MedQ  DD: 10/15/2007  DT: 10/16/2007  Job #: [id number]   cc:   Georgann Housekeeper, MD  Pramod P. Pearlean Brownie, MD

## 2010-12-23 NOTE — Discharge Summary (Signed)
Jared Landry, Jared Landry NO.:  [id number]   MEDICAL RECORD NO.:  [id number]          PATIENT TYPE:  INP   LOCATION:  6522                         FACILITY:  [facility]   PHYSICIAN:  Georgann Housekeeper, MD      DATE OF BIRTH:  1941-05-12   DATE OF ADMISSION:  10/16/2007  DATE OF DISCHARGE:  10/19/2007                               DISCHARGE SUMMARY   DISCHARGE AND FINAL DIAGNOSES:  1. Congestive heart failure, systolic dysfunction.  2. Coronary artery disease, status post catheterization and stent      placement.  3. Chronic obstructive pulmonary disease exacerbation.  4. Atrial fibrillation, chronic.   MEDICATION ON DISCHARGE:  1. Diltiazem 180 mg daily.  2. Lasix 40 mg twice a day.  3. Advair 250/50 one inhalation b.i.d.  4. Metformin 500 mg b.i.d.  5. Zithromax 250 mg for 3 days.  6. Coumadin 5 mg as directed.  7. Prednisone 40 mg for 2 days and 20 mg for 2 days.  8. Lisinopril 5 mg daily.  9. Aspirin 81 mg daily.  10.Plavix 75 mg daily.  11.Metoprolol 25 mg twice a day.   CONSULTATION:  Cardiology.   PROCEDURE:  Echocardiogram, EF of 35% with a moderate MR, cardiac  catheterization with 2-vessel disease, status post angioplasty and stent  placement to PCI.   HOSPITAL COURSE:  A 70 years old male who is admitted with shortness of  breath, COPD exacerbation, and atrial fibrillation.  He was rate  controlled and started on prednisone.  Also had heart failure, we  started on IV Lasix and Coumadin.  The patient had a cardiology consult.  An echo was done, showed an EF 30-35%.  Because of his underlying CHF  and diabetes, he underwent a cardiac catheterization which showed a 2-  vessel disease with status post stent placement successfully.   The patient will continue on Plavix, Coumadin, and aspirin with rate  controlled.  To follow with cardiology.  As far as his COPD  exacerbation, tapering prednisone and much improvement in his breathing.  Continue azithromycin  for possible bronchitis.  As far as his diabetes,  continue on metformin and follow up with cardiology and Coumadin clinic.  Follow with me in 1 week.      Georgann Housekeeper, MD  Electronically Signed     KH/MEDQ  D:  11/11/2007  T:  11/11/2007  Job:  [id number]

## 2011-01-12 ENCOUNTER — Ambulatory Visit (INDEPENDENT_AMBULATORY_CARE_PROVIDER_SITE_OTHER): Payer: Medicare Other | Admitting: *Deleted

## 2011-01-12 DIAGNOSIS — I428 Other cardiomyopathies: Secondary | ICD-10-CM

## 2011-01-13 ENCOUNTER — Other Ambulatory Visit: Payer: Self-pay

## 2011-01-19 NOTE — Progress Notes (Signed)
icd remote check  

## 2011-01-20 ENCOUNTER — Encounter: Payer: Self-pay | Admitting: *Deleted

## 2011-02-10 ENCOUNTER — Ambulatory Visit: Payer: Medicare Other | Attending: Internal Medicine | Admitting: Physical Therapy

## 2011-02-10 DIAGNOSIS — IMO0001 Reserved for inherently not codable concepts without codable children: Secondary | ICD-10-CM | POA: Insufficient documentation

## 2011-02-10 DIAGNOSIS — R269 Unspecified abnormalities of gait and mobility: Secondary | ICD-10-CM | POA: Insufficient documentation

## 2011-02-13 ENCOUNTER — Ambulatory Visit: Payer: Medicare Other | Admitting: *Deleted

## 2011-02-17 ENCOUNTER — Ambulatory Visit: Payer: Medicare Other | Admitting: *Deleted

## 2011-02-20 ENCOUNTER — Ambulatory Visit: Payer: Medicare Other | Admitting: *Deleted

## 2011-02-22 ENCOUNTER — Ambulatory Visit: Payer: Medicare Other | Admitting: *Deleted

## 2011-02-22 ENCOUNTER — Encounter: Payer: Medicare Other | Admitting: *Deleted

## 2011-02-22 ENCOUNTER — Encounter: Payer: Medicare Other | Admitting: Physical Therapy

## 2011-03-02 ENCOUNTER — Ambulatory Visit: Payer: Medicare Other | Admitting: Physical Therapy

## 2011-03-03 ENCOUNTER — Ambulatory Visit: Payer: Medicare Other | Admitting: Physical Therapy

## 2011-03-06 ENCOUNTER — Ambulatory Visit: Payer: Medicare Other | Admitting: Physical Therapy

## 2011-03-08 ENCOUNTER — Ambulatory Visit: Payer: Medicare Other | Attending: Internal Medicine | Admitting: Physical Therapy

## 2011-03-08 DIAGNOSIS — R42 Dizziness and giddiness: Secondary | ICD-10-CM | POA: Insufficient documentation

## 2011-03-08 DIAGNOSIS — IMO0001 Reserved for inherently not codable concepts without codable children: Secondary | ICD-10-CM | POA: Insufficient documentation

## 2011-03-08 DIAGNOSIS — R269 Unspecified abnormalities of gait and mobility: Secondary | ICD-10-CM | POA: Insufficient documentation

## 2011-05-01 LAB — PROTIME-INR
INR: 1.2
INR: 1.7 — ABNORMAL HIGH
Prothrombin Time: 20.2 — ABNORMAL HIGH

## 2011-05-01 LAB — LIPID PANEL
LDL Cholesterol: 68
Total CHOL/HDL Ratio: 2.2
Triglycerides: 33
VLDL: 7

## 2011-05-01 LAB — CBC
HCT: 40.4
HCT: 43.1
Hemoglobin: 13.7
Hemoglobin: 14.4
MCHC: 33.6
MCV: 101.2 — ABNORMAL HIGH
MCV: 101.4 — ABNORMAL HIGH
Platelets: 224
RBC: 4 — ABNORMAL LOW
RBC: 4.26
RDW: 15.2
RDW: 15.4
WBC: 10.3
WBC: 11.4 — ABNORMAL HIGH
WBC: 11.7 — ABNORMAL HIGH

## 2011-05-01 LAB — BASIC METABOLIC PANEL
BUN: 22
CO2: 30
CO2: 30
Calcium: 9.3
Chloride: 97
Chloride: 98
Chloride: 99
Creatinine, Ser: 0.84
Creatinine, Ser: 0.99
GFR calc Af Amer: >60
GFR calc non Af Amer: >60
Glucose, Bld: 179 — ABNORMAL HIGH
Potassium: 4.3
Potassium: 4.3
Sodium: 137
Sodium: 137
Sodium: 137

## 2011-05-01 LAB — I-STAT 8, (EC8 V) (CONVERTED LAB)
Acid-base deficit: 5 — ABNORMAL HIGH
Bicarbonate: 20.8
HCT: 54 — ABNORMAL HIGH
Operator id: 270651
TCO2: 22
pCO2, Ven: 39 — ABNORMAL LOW
pH, Ven: 7.335 — ABNORMAL HIGH

## 2011-05-01 LAB — B-NATRIURETIC PEPTIDE (CONVERTED LAB)
Pro B Natriuretic peptide (BNP): 1146 — ABNORMAL HIGH
Pro B Natriuretic peptide (BNP): 727 — ABNORMAL HIGH

## 2011-05-01 LAB — DIFFERENTIAL
Basophils Absolute: 0
Basophils Relative: 0
Eosinophils Absolute: 0.2
Lymphs Abs: 2.3
Neutrophils Relative %: 71

## 2011-05-01 LAB — POCT CARDIAC MARKERS
CKMB, poc: 1.7
Myoglobin, poc: 30.3
Troponin i, poc: <0.05

## 2011-05-01 LAB — CARDIAC PANEL(CRET KIN+CKTOT+MB+TROPI)
CK, MB: 2.7
CK, MB: 2.8
Total CK: 58
Troponin I: 0.01

## 2011-05-01 LAB — CK TOTAL AND CKMB (NOT AT ARMC)
CK, MB: 2.8
Total CK: 70

## 2011-05-10 ENCOUNTER — Encounter: Payer: Self-pay | Admitting: Internal Medicine

## 2011-05-24 ENCOUNTER — Encounter: Payer: Self-pay | Admitting: Internal Medicine

## 2011-05-26 ENCOUNTER — Other Ambulatory Visit: Payer: Self-pay

## 2011-05-31 ENCOUNTER — Encounter: Payer: Self-pay | Admitting: Internal Medicine

## 2011-05-31 ENCOUNTER — Ambulatory Visit (INDEPENDENT_AMBULATORY_CARE_PROVIDER_SITE_OTHER): Payer: Medicare Other | Admitting: Internal Medicine

## 2011-05-31 DIAGNOSIS — I2589 Other forms of chronic ischemic heart disease: Secondary | ICD-10-CM

## 2011-05-31 DIAGNOSIS — I4949 Other premature depolarization: Secondary | ICD-10-CM

## 2011-05-31 DIAGNOSIS — I472 Ventricular tachycardia, unspecified: Secondary | ICD-10-CM

## 2011-05-31 DIAGNOSIS — I5022 Chronic systolic (congestive) heart failure: Secondary | ICD-10-CM

## 2011-05-31 DIAGNOSIS — I255 Ischemic cardiomyopathy: Secondary | ICD-10-CM

## 2011-05-31 DIAGNOSIS — I493 Ventricular premature depolarization: Secondary | ICD-10-CM

## 2011-05-31 DIAGNOSIS — I4891 Unspecified atrial fibrillation: Secondary | ICD-10-CM

## 2011-05-31 DIAGNOSIS — Z95810 Presence of automatic (implantable) cardiac defibrillator: Secondary | ICD-10-CM | POA: Insufficient documentation

## 2011-05-31 DIAGNOSIS — R42 Dizziness and giddiness: Secondary | ICD-10-CM

## 2011-05-31 LAB — ICD DEVICE OBSERVATION
AL AMPLITUDE: 1.2 mv
AL IMPEDENCE ICD: 375 Ohm
BAMS-0001: 170 {beats}/min
DEVICE MODEL ICD: 604440
HV IMPEDENCE: 70 Ohm
LV LEAD IMPEDENCE ICD: 362.5 Ohm
RV LEAD IMPEDENCE ICD: 425 Ohm
TOT-0007: 3
TOT-0008: 0
TOT-0009: 3
TOT-0010: 13
TZON-0004SLOWVT: 24
TZON-0005SLOWVT: 6
VF: 8

## 2011-05-31 LAB — CBC WITH DIFFERENTIAL/PLATELET
Eosinophils Relative: 3.8 % (ref 0.0–5.0)
HCT: 33.3 % — ABNORMAL LOW (ref 39.0–52.0)
Hemoglobin: 11.4 g/dL — ABNORMAL LOW (ref 13.0–17.0)
Lymphs Abs: 1.5 10*3/uL (ref 0.7–4.0)
MCV: 101.2 fl — ABNORMAL HIGH (ref 78.0–100.0)
Monocytes Relative: 11.1 % (ref 3.0–12.0)
Neutro Abs: 5 10*3/uL (ref 1.4–7.7)
WBC: 7.7 10*3/uL (ref 4.5–10.5)

## 2011-05-31 LAB — PROTIME-INR: INR: 4 ratio — ABNORMAL HIGH (ref 0.8–1.0)

## 2011-05-31 LAB — BASIC METABOLIC PANEL
Chloride: 97 mEq/L (ref 96–112)
GFR: 102.67 mL/min (ref >60.00)
Glucose, Bld: 119 mg/dL — ABNORMAL HIGH (ref 70–99)
Potassium: 4.7 mEq/L (ref 3.5–5.1)
Sodium: 137 mEq/L (ref 135–145)

## 2011-05-31 MED ORDER — LISINOPRIL 2.5 MG PO TABS: 2.5000 mg | ORAL_TABLET | Freq: Every day | ORAL | Status: DC

## 2011-05-31 MED ORDER — AMIODARONE HCL 400 MG PO TABS: ORAL_TABLET | ORAL | Status: DC

## 2011-05-31 NOTE — Assessment & Plan Note (Signed)
The patient has persistent atrial fibrillation since December 2011; with that he has lost biventricular pacing. There's been concurrent issues with dizziness and less good exercise tolerance. He raises a concern as to whether restoration of sinus rhythm would be valuable. He was unable to maintain sinus rhythm following his shock (see below) and so I would recommend the initiation of amiodarone. We have discussed the potential side effects including worsening of his dizziness. Dr. Anne Fu will arrange for subsequent cardioversion

## 2011-05-31 NOTE — Assessment & Plan Note (Addendum)
The patient had polymorphic ventricular tachycardia. The concern is ischemia as a mechanism, lelectrolyte disturbances or QT prolonging drugs none of which are noted on his MAR.  Check his potassium and magnesium today. I have discussed with his primary cardiologist the issue of ischemia and he will undertake catheterization.  He is advised not to drive for 3-6 month

## 2011-05-31 NOTE — Assessment & Plan Note (Signed)
This may be related to atrial fibrillatione. It is possible he has had an intercurrent stroke. I will defer further violation of this to Dr. Champ Mungo. Also aware that amiodarone can make this worse but hopefully with cardioversion if atrial fibrillation get better

## 2011-05-31 NOTE — Assessment & Plan Note (Signed)
As above.

## 2011-05-31 NOTE — Patient Instructions (Addendum)
Your physician has recommended you make the following change in your medication:  1) Start amiodarone 400mg  one tablet by mouth twice daily for 2 weeks. - this will be further adjusted by [facility]. 2) Decrease lisinopril to 2.5mg  one tablet by mouth daily.  Your physician recommends that you have lab work today: bmp/cbc/inr/tsh/magnesium  Remote monitoring is used to monitor your Pacemaker of ICD from home. This monitoring reduces the number of office visits required to check your device to one time per year. It allows [place] to keep an eye on the functioning of your device to ensure it is working properly. You are scheduled for a device check from home on 08/31/11. You may send your transmission at any time that day. If you have a wireless device, the transmission will be sent automatically. After your physician reviews your transmission, you will receive a postcard with your next transmission date.

## 2011-05-31 NOTE — Assessment & Plan Note (Signed)
This has been somewhat worse with loss of biventricular pacing. Restoration of sinus rhythm may allow for improvement in functional status

## 2011-05-31 NOTE — Assessment & Plan Note (Signed)
The patient's device was interrogated.  The information was reviewed. No changes were made in the programming.    
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
## 2011-06-01 ENCOUNTER — Telehealth: Payer: Self-pay | Admitting: Internal Medicine

## 2011-06-01 NOTE — Telephone Encounter (Signed)
Labs faxed to [facility] @ [phone]  06/01/11/km

## 2011-06-06 ENCOUNTER — Ambulatory Visit (HOSPITAL_COMMUNITY)
Admission: RE | Admit: 2011-06-06 | Discharge: 2011-06-06 | Disposition: A | Discharge status: Home / Self Care | Payer: Medicare Other | Source: Ambulatory Visit | Attending: Cardiology | Admitting: Cardiology

## 2011-06-06 DIAGNOSIS — I2510 Atherosclerotic heart disease of native coronary artery without angina pectoris: Secondary | ICD-10-CM | POA: Insufficient documentation

## 2011-06-06 DIAGNOSIS — I428 Other cardiomyopathies: Secondary | ICD-10-CM | POA: Insufficient documentation

## 2011-06-06 DIAGNOSIS — Z9861 Coronary angioplasty status: Secondary | ICD-10-CM | POA: Insufficient documentation

## 2011-06-06 LAB — PROTIME-INR: INR: 1.37 (ref 0.00–1.49)

## 2011-06-06 LAB — GLUCOSE, CAPILLARY
Glucose-Capillary: 99 mg/dL (ref 70–99)
Glucose-Capillary: 88 mg/dL (ref 70–99)

## 2011-06-08 NOTE — Cardiovascular Report (Signed)
NAME:  JIHAN, RUDY NO.:  [id number]  MEDICAL RECORD NO.:  [id number]  LOCATION:  [facility]                         FACILITY:  [facility]  PHYSICIAN:  Jake Bathe, MD      DATE OF BIRTH:  04-Jun-1941  DATE OF PROCEDURE:  06/06/2011 DATE OF DISCHARGE:                           CARDIAC CATHETERIZATION   PROCEDURE:  Left heart catheterizations via the right groin approach.  INDICATIONS:  A 70 year old male with ischemic/nonischemic cardiomyopathy, who suffered polymorphic VT arrest with successful defibrillation firing in early October.  This catheterization is being performed to ensure that there is no evidence of further ischemia.  PROCEDURE DETAILS:  Informed consent was obtained.  Risk of stroke, heart attack, death, renal impairment, arterial damage, bleeding were explained to the patient at length.  A 5-French sheath was inserted in the right femoral artery after visualization of the femoral head and 1% lidocaine was used.  Judkins left #4 and a Judkins right #4 catheter were used to selectively cannulate the coronary arteries.  Multiple views with hand injection of Omnipaque were obtained and an angled pigtail was used to cross the left ventricle and a left ventriculogram utilizing 25 mL of contrast with power injection was performed. Hemodynamics were obtained.  Pullback was obtained.  FINDINGS: 1. Left main artery splits into the LAD and circumflex artery.  No     angiographically significant disease. 2. LAD-there is diffuse calcification along the proximal-to-mid     portion.  The previously placed LAD stent is widely patent with     minor step-down distally.  The stent covers 2 diagonal branches     both of which are widely patent with only minimal narrowing at the     ostium.  Previously, these 2 branches were PTCA'd.  The remainder     of the LAD is tortuous and wraps to the apex. 3. Circumflex artery, widely patent vessel giving rise to 3 obtuse  marginal branches, large caliber. 4. Right coronary artery mid calcification with 20% stenosis distally.     Bifurcation of posterior descending artery is normal.  This vessel     was dominant giving rise to the posterior descending artery. 5. Left ventriculogram.  Left ventricular ejection fraction in the 35%     range fully with mid-to- distal anterior wall/anterolateral     hypokinesis.  No significant mitral regurgitation detected.  ICD     leads noted as well as LV pacing lead.  During the case, 200 mcg of intracoronary nitroglycerin was injected intracoronary in the LAD distribution.  This resulted in vasodilatation of the vessels.  HEMODYNAMICS:  Left ventricular systolic pressure 103 with an end- diastolic pressure of 9 mmHg.  Aortic pressure 102/55 with a mean of 74 mmHg.  No significant gradient.  IMPRESSION: 1. Widely patent drug-eluting stent in the mid left anterior     descending distribution with surrounding arterial calcification and     2 diagonal branches remained patent with only minor luminal     irregularity at their ostium.  Otherwise, calcification noted     through coronary artery tree without any significant stenosis. 2. Decreased ejection fraction of 35%, with mid-to-distal anterior  wall/anterolateral severe hypokinesis. 3. Well compensated heart failure with left ventricular end-diastolic     pressure of 9 mmHg.  PLAN:  Currently, on amiodarone in anticipation of cardioversion.  We will resume Coumadin tomorrow night.  Once he is therapeutic on Coumadin for 1 month, we will go ahead and perform cardioversion and hopefully provide better re-synchronization.  Continue with current medical therapy.     Jake Bathe, MD     MCS/MEDQ  D:  06/06/2011  T:  06/06/2011  Job:  [id number]  cc:   Duke Salvia, MD, [facility]  Electronically Signed by Donato Schultz MD on 06/08/2011 04:54:09 AM

## 2011-07-06 ENCOUNTER — Encounter: Payer: Self-pay | Admitting: Internal Medicine

## 2011-07-06 NOTE — Progress Notes (Signed)
Addended by: Brien Mates R on: 07/06/2011 12:34 PM   Modules accepted: Orders

## 2011-07-13 ENCOUNTER — Encounter (HOSPITAL_COMMUNITY): Payer: Self-pay

## 2011-07-17 ENCOUNTER — Other Ambulatory Visit: Payer: Self-pay | Admitting: Cardiology

## 2011-07-17 MED ORDER — SODIUM CHLORIDE 0.9 % IJ SOLN: 3.0000 mL | INTRAMUSCULAR | Status: DC | PRN

## 2011-07-17 MED ORDER — SODIUM CHLORIDE 0.9 % IV SOLN
250.0000 mL | INTRAVENOUS | Status: DC | PRN
  Administered 2012-01-18: 12:00:00 via INTRAVENOUS

## 2011-07-17 MED ORDER — SODIUM CHLORIDE 0.9 % IJ SOLN: 3.0000 mL | Freq: Two times a day (BID) | INTRAMUSCULAR | Status: DC

## 2011-07-17 MED ORDER — HYDROCORTISONE 1 % EX CREA: 1.0000 "application " | TOPICAL_CREAM | Freq: Three times a day (TID) | CUTANEOUS | Status: DC | PRN
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
## 2011-07-20 ENCOUNTER — Encounter (HOSPITAL_COMMUNITY): Payer: Self-pay | Admitting: *Deleted

## 2011-07-20 ENCOUNTER — Encounter (HOSPITAL_COMMUNITY): Payer: Self-pay | Admitting: Cardiology

## 2011-07-20 ENCOUNTER — Other Ambulatory Visit: Payer: Self-pay

## 2011-07-20 ENCOUNTER — Ambulatory Visit (HOSPITAL_COMMUNITY)
Admission: RE | Admit: 2011-07-20 | Discharge: 2011-07-20 | Disposition: A | Discharge status: Home / Self Care | Payer: Medicare Other | Source: Ambulatory Visit | Attending: Cardiology | Admitting: Cardiology

## 2011-07-20 ENCOUNTER — Encounter (HOSPITAL_COMMUNITY)
Admission: RE | Disposition: A | Discharge status: Home / Self Care | Payer: Self-pay | Source: Ambulatory Visit | Attending: Cardiology

## 2011-07-20 DIAGNOSIS — I10 Essential (primary) hypertension: Secondary | ICD-10-CM | POA: Insufficient documentation

## 2011-07-20 DIAGNOSIS — I2510 Atherosclerotic heart disease of native coronary artery without angina pectoris: Secondary | ICD-10-CM | POA: Insufficient documentation

## 2011-07-20 DIAGNOSIS — K219 Gastro-esophageal reflux disease without esophagitis: Secondary | ICD-10-CM | POA: Insufficient documentation

## 2011-07-20 DIAGNOSIS — I4892 Unspecified atrial flutter: Secondary | ICD-10-CM | POA: Insufficient documentation

## 2011-07-20 DIAGNOSIS — E119 Type 2 diabetes mellitus without complications: Secondary | ICD-10-CM | POA: Insufficient documentation

## 2011-07-20 DIAGNOSIS — Z95810 Presence of automatic (implantable) cardiac defibrillator: Secondary | ICD-10-CM | POA: Insufficient documentation

## 2011-07-20 DIAGNOSIS — J449 Chronic obstructive pulmonary disease, unspecified: Secondary | ICD-10-CM | POA: Insufficient documentation

## 2011-07-20 DIAGNOSIS — Z87891 Personal history of nicotine dependence: Secondary | ICD-10-CM | POA: Insufficient documentation

## 2011-07-20 DIAGNOSIS — I428 Other cardiomyopathies: Secondary | ICD-10-CM | POA: Insufficient documentation

## 2011-07-20 DIAGNOSIS — J4489 Other specified chronic obstructive pulmonary disease: Secondary | ICD-10-CM | POA: Insufficient documentation

## 2011-07-20 HISTORY — PX: CARDIOVERSION: SHX1299

## 2011-07-20 LAB — GLUCOSE, CAPILLARY: Glucose-Capillary: 95 mg/dL (ref 70–99)

## 2011-07-20 LAB — PROTIME-INR: INR: 2.94 — ABNORMAL HIGH (ref 0.00–1.49)

## 2011-07-20 SURGERY — CARDIOVERSION
Anesthesia: Monitor Anesthesia Care | Wound class: Clean

## 2011-07-20 MED ORDER — PROPOFOL 10 MG/ML IV BOLUS
INTRAVENOUS | Status: DC | PRN
  Administered 2011-07-20: 90 mg via INTRAVENOUS

## 2011-07-20 MED ORDER — HYDROCORTISONE 1 % EX CREA
TOPICAL_CREAM | CUTANEOUS | Status: AC
  Filled 2011-07-20: qty 28

## 2011-07-20 MED ORDER — AMIODARONE HCL 200 MG PO TABS: 200.0000 mg | ORAL_TABLET | Freq: Every day | ORAL | Status: DC

## 2011-07-20 MED ORDER — SODIUM CHLORIDE 0.9 % IV SOLN: 250.0000 mL | INTRAVENOUS | Status: DC | PRN

## 2011-07-20 MED ORDER — LACTATED RINGERS IV SOLN
INTRAVENOUS | Status: DC | PRN
  Administered 2011-07-20: 11:00:00 via INTRAVENOUS

## 2011-07-20 MED ORDER — HYDROCORTISONE 1 % EX CREA
1.0000 "application " | TOPICAL_CREAM | Freq: Three times a day (TID) | CUTANEOUS | Status: DC | PRN
  Administered 2011-07-20: 1 via TOPICAL
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
## 2011-07-20 NOTE — Op Note (Addendum)
Electrical Cardioversion Procedure Note Jared Landry [id number] 1941/06/23  Procedure: Electrical Cardioversion Indications:  Atrial Flutter, cardiomyopathy on amiodarone, ICD-BiV  Time Out: Verified patient identification, verified procedure,medications/allergies/relevent history reviewed, required imaging and test results available.  Performed  Procedure Details  The patient was NPO after midnight. Anesthesia was administered at the beside  by Dr.Fitzgerald with 90mg  of propofol.  Cardioversion was done with synchronized biphasic defibrillation with AP pads with 150j x 1.  The patient converted to normal sinus rhythm. The patient tolerated the procedure well   IMPRESSION:  Successful cardioversion of atrial fibrillation  Will see back in one week.  Device was interrogated real time during shock. Functioning well. AMS rate decreased to 140.  Prior to cardioversion - aflutter.  SKAINS, MARK 07/20/2011, 11:33 AM

## 2011-07-20 NOTE — Preoperative (Signed)
Beta Blockers   Reason not to administer Beta Blockers:Pt took BB 07-20-11 in am.

## 2011-07-20 NOTE — Anesthesia Preprocedure Evaluation (Addendum)
Anesthesia Evaluation   Patient awake    Reviewed: Allergy & Precautions, H&P , NPO status , Patient's Chart, lab work & pertinent test results, reviewed documented beta blocker date and time   Airway Mallampati: I TM Distance: >3 FB Neck ROM: Full    Dental  (+) Teeth Intact   Pulmonary COPDformer smoker         Cardiovascular hypertension, Pt. on home beta blockers + CAD + dysrhythmias + pacemaker + Cardiac Defibrillator  St Jude AICD placed 2009.  Most recent discharge of device 05-2011 for VT. EF= 35-40%   Neuro/Psych    GI/Hepatic GERD-  ,  Endo/Other  Diabetes mellitus-, Type 2, Oral Hypoglycemic Agents  Renal/GU      Musculoskeletal   Abdominal   Peds  Hematology   Anesthesia Other Findings   Reproductive/Obstetrics                          Anesthesia Physical Anesthesia Plan  ASA: III  Anesthesia Plan: MAC   Post-op Pain Management:    Induction: Intravenous  Airway Management Planned: Mask  Additional Equipment:   Intra-op Plan:   Post-operative Plan:   Informed Consent: I have reviewed the patients History and Physical, chart, labs and discussed the procedure including the risks, benefits and alternatives for the proposed anesthesia with the patient or authorized representative who has indicated his/her understanding and acceptance.     Plan Discussed with: Anesthesiologist and Surgeon  Anesthesia Plan Comments:         Anesthesia Quick Evaluation

## 2011-07-20 NOTE — Interval H&P Note (Signed)
History and Physical Interval Note:  07/20/2011 10:36 AM  Jared Landry  has presented today for surgery, with the diagnosis of a-fib  The various methods of treatment have been discussed with the patient and family. After consideration of risks, benefits and other options for treatment, the patient has consented to  Procedure(s): CARDIOVERSION as a surgical intervention .  The patients' history has been reviewed, patient examined, no change in status, stable for surgery.  I have reviewed the patients' chart and labs.  Questions were answered to the patient's satisfaction.     Jared Landry

## 2011-07-20 NOTE — Anesthesia Postprocedure Evaluation (Signed)
  Anesthesia Post-op Note  Patient: Jared Landry  Procedure(s) Performed:  CARDIOVERSION  Patient Location: Short Stay  Anesthesia Type: MAC  Level of Consciousness: awake and alert   Airway and Oxygen Therapy: Patient Spontanous Breathing  Post-op Pain: none  Post-op Assessment: Post-op Vital signs reviewed, Patient's Cardiovascular Status Stable and Respiratory Function Stable  Post-op Vital Signs: Reviewed and stable  Complications: No apparent anesthesia complications

## 2011-07-20 NOTE — Progress Notes (Signed)
TIMEOUT COMPLETED

## 2011-07-20 NOTE — Transfer of Care (Signed)
Immediate Anesthesia Transfer of Care Note  Patient: Jared Landry  Procedure(s) Performed:  CARDIOVERSION  Patient Location: PACU and Short Stay  Anesthesia Type: MAC  Level of Consciousness: awake, alert  and oriented  Airway & Oxygen Therapy: Patient Spontanous Breathing and Patient connected to nasal cannula oxygen  Post-op Assessment: Report given to PACU RN and Post -op Vital signs reviewed and stable  Post vital signs: Reviewed and stable  Complications: No apparent anesthesia complications

## 2011-07-21 ENCOUNTER — Encounter (HOSPITAL_COMMUNITY): Payer: Self-pay | Admitting: Cardiology

## 2011-08-31 ENCOUNTER — Ambulatory Visit (INDEPENDENT_AMBULATORY_CARE_PROVIDER_SITE_OTHER): Payer: Medicare Other | Admitting: *Deleted

## 2011-08-31 ENCOUNTER — Encounter: Payer: Self-pay | Admitting: Internal Medicine

## 2011-08-31 DIAGNOSIS — Z95810 Presence of automatic (implantable) cardiac defibrillator: Secondary | ICD-10-CM

## 2011-08-31 DIAGNOSIS — I472 Ventricular tachycardia, unspecified: Secondary | ICD-10-CM

## 2011-08-31 DIAGNOSIS — I4891 Unspecified atrial fibrillation: Secondary | ICD-10-CM

## 2011-08-31 LAB — REMOTE ICD DEVICE
AL AMPLITUDE: 0.7 mv
AL IMPEDENCE ICD: 380 Ohm
BAMS-0003: 70 {beats}/min
HV IMPEDENCE: 64 Ohm
LV LEAD IMPEDENCE ICD: 360 Ohm
RV LEAD IMPEDENCE ICD: 390 Ohm
TZON-0003SLOWVT: 335 ms
TZON-0004SLOWVT: 24
TZON-0005SLOWVT: 6
TZON-0010SLOWVT: 80 ms

## 2011-09-05 NOTE — Progress Notes (Signed)
Remote icd check  

## 2011-09-08 ENCOUNTER — Encounter: Payer: Self-pay | Admitting: *Deleted

## 2011-10-04 ENCOUNTER — Encounter: Payer: Self-pay | Admitting: Internal Medicine

## 2011-10-04 ENCOUNTER — Ambulatory Visit (INDEPENDENT_AMBULATORY_CARE_PROVIDER_SITE_OTHER): Payer: Medicare Other | Admitting: Internal Medicine

## 2011-10-04 DIAGNOSIS — I2589 Other forms of chronic ischemic heart disease: Secondary | ICD-10-CM

## 2011-10-04 DIAGNOSIS — I4891 Unspecified atrial fibrillation: Secondary | ICD-10-CM

## 2011-10-04 DIAGNOSIS — I255 Ischemic cardiomyopathy: Secondary | ICD-10-CM

## 2011-10-04 DIAGNOSIS — J449 Chronic obstructive pulmonary disease, unspecified: Secondary | ICD-10-CM

## 2011-10-04 DIAGNOSIS — Z95810 Presence of automatic (implantable) cardiac defibrillator: Secondary | ICD-10-CM

## 2011-10-04 DIAGNOSIS — I472 Ventricular tachycardia: Secondary | ICD-10-CM

## 2011-10-04 DIAGNOSIS — I5022 Chronic systolic (congestive) heart failure: Secondary | ICD-10-CM

## 2011-10-04 DIAGNOSIS — R0602 Shortness of breath: Secondary | ICD-10-CM

## 2011-10-04 LAB — ICD DEVICE OBSERVATION
AL IMPEDENCE ICD: 375 Ohm
AL THRESHOLD: 0.75 V
ATRIAL PACING ICD: 0.25 pct
BAMS-0001: 140 {beats}/min
BAMS-0003: 70 {beats}/min
PACEART VT: 0
RV LEAD IMPEDENCE ICD: 425 Ohm
RV LEAD THRESHOLD: 0.75 V
TOT-0006: 20110502000000
TOT-0007: 3
TOT-0009: 3
TZON-0003SLOWVT: 335 ms
TZON-0005SLOWVT: 6

## 2011-10-04 NOTE — Assessment & Plan Note (Signed)
The patient's device was interrogated.  The information was reviewed. No changes were made in the programming.    

## 2011-10-04 NOTE — Assessment & Plan Note (Signed)
The patient has recurrent and now persistent atrial flutter I tried to pace terminated at the end up with atrial fibrillation. He is unfortunately quite symptomatic but is not clear whether it is cardiac or respiratory with his COPD.  Options as relates to his atrial fibrillation would include repeat cardioversion, AV junction ablation given the fact that he is pacing only 90% of the time with a CRT device, or atrial fibrillation ablation. As relates to the latter we need to the left atrial dimension and will ask Dr. Chales Abrahams to do an echo. As relates to the issue of cardiac versus pulmonary, we'll undertake a cardiopulmonary stress test.

## 2011-10-04 NOTE — Patient Instructions (Signed)
Dr Anne Fu' office will call you about scheduling echocardiogram  Your physician has recommended that you have a cardiopulmonary stress test (CPX). CPX testing is a non-invasive measurement of heart and lung function. It replaces a traditional treadmill stress test. This type of test provides a tremendous amount of information that relates not only to your present condition but also for future outcomes. This test combines measurements of you ventilation, respiratory gas exchange in the lungs, electrocardiogram (EKG), blood pressure and physical response before, during, and following an exercise protocol.  Your physician recommends that you schedule a follow-up appointment in: 4-6 weeks with Dr Graciela Husbands.

## 2011-10-04 NOTE — Assessment & Plan Note (Signed)
As above.

## 2011-10-04 NOTE — Progress Notes (Signed)
HPI  Jared Landry is a 71 y.o. male is seen following CRT-D implantation implanted for congestive heart failure in the setting of coronary artery disease atrial fibrillation and diabetes.  He is s/p DCCV in dec2012 for Afib/flutter in the context of amiodarone having been loaded  He reverted within hours. It wasn't clear as to how symptomatic is his atrial fibrillation Remains quite symptomatic. When last year or 2 he has not been able to climb the stairs to his office without pulling himself up. He is able to push the arch. He has moderate shortness of breath  He also has edema   i should note that also DFT testing was deferred at the initial implant secondary to concerns about pericardial irritation which prompted repositioning of his RV lead and was not undertaken at the second procedure.    Past Medical History  Diagnosis Date  . Atrial fibrillation     Cardioversion 07/20/11 - successful  . CAD (coronary artery disease)     s/p drug eluting stent  . Diabetes mellitus     type II  . Hypertension   . Gout   . Osteoarthritis   . COPD (chronic obstructive pulmonary disease)   . 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 Jude  . Ventricular tachycardia     Amorphic terminated by the ICD    Past Surgical History  Procedure Date  . Appendectomy   . Tonsillectomy   . Vasectomy   . Surgical i &d after brown recluse inner thigh   . Cardiac defibrillator placement     St. Jude   . Cardioversion 07/20/2011    Procedure: CARDIOVERSION;  Surgeon: Donato Schultz, MD;  Location: [facility] OR;  Service: Cardiovascular;  Laterality: N/A;    Current Outpatient Prescriptions  Medication Sig Dispense Refill  . albuterol (PROVENTIL HFA;VENTOLIN HFA) 108 (90 BASE) MCG/ACT inhaler Inhale 2 puffs into the lungs every 6 (six) hours as needed. For shortness of breath.      Marland Kitchen amiodarone (PACERONE) 200 MG tablet Take 1 tablet (200 mg total) by mouth daily.  30 tablet  12  .  aspirin 81 MG tablet Take 81 mg by mouth daily.        . ASTAXANTHIN PO Take 1 tablet by mouth every other day.        Marland Kitchen atorvastatin (LIPITOR) 10 MG tablet Take 10 mg by mouth daily.        Marland Kitchen b complex vitamins tablet Take 1 tablet by mouth every other day.       . cholecalciferol (VITAMIN D) 1000 UNITS tablet Take 2,000 Units by mouth daily.       . Coenzyme Q10 (CO Q-10) 100 MG CAPS Take 1 tablet by mouth every other day.       . colchicine 0.6 MG tablet Take 0.6 mg by mouth daily.        . Fluticasone-Salmeterol (ADVAIR) 100-50 MCG/DOSE AEPB Inhale 1 puff into the lungs every 12 (twelve) hours.        . furosemide (LASIX) 40 MG tablet Take 10 mg by mouth daily.       . Glucosamine HCl-Glucosamin SO4 (GLUCOSAMINE COMPLEX PO) Take 1 tablet by mouth daily.        Marland Kitchen lisinopril (PRINIVIL,ZESTRIL) 10 MG tablet Take 2.5 mg by mouth daily.        Marland Kitchen lisinopril (PRINIVIL,ZESTRIL) 2.5 MG tablet Take 1 tablet (2.5 mg total) by mouth daily.  30 tablet  6  . Magnesium 300 MG CAPS Take 1 capsule by mouth every other day.       . metFORMIN (GLUCOPHAGE) 500 MG tablet Take 500 mg by mouth 2 (two) times daily with a meal.        . metoprolol (TOPROL-XL) 100 MG 24 hr tablet Take 100 mg by mouth daily.        Marland Kitchen warfarin (COUMADIN) 5 MG tablet Take 2.5-5 mg by mouth daily. Takes 1/2 tab on Tuesday, Thursday, saturday Takes 1 tab on Monday,  Wednesday, Friday, and sunday      . Zinc 100 MG TABS Take 1 tablet by mouth every other day.        No current facility-administered medications for this visit.   Facility-Administered Medications Ordered in Other Visits  Medication Dose Route Frequency Provider Last Rate Last Dose  . 0.9 %  sodium chloride infusion  250 mL Intravenous PRN Donato Schultz, MD      . hydrocortisone cream 1 % 1 application  1 application Topical TID PRN Donato Schultz, MD      . sodium chloride 0.9 % injection 3 mL  3 mL Intravenous Q12H Donato Schultz, MD      . sodium chloride 0.9 % injection 3 mL   3 mL Intravenous PRN Donato Schultz, MD        No Known Allergies  Review of Systems negative except from HPI and PMH  Physical Exam BP 130/80  Ht 5' 7.5" (1.715 m)  Wt 196 lb (88.905 kg)  BMI 30.24 kg/m2 Well developed and well nourished in no acute distress HENT normal E scleral and icterus clear Neck Supple JVP flat; carotids brisk and full Clear to ausculation *Regular rate and rhythm,2/6 sys m Soft with active bowel sounds No clubbing cyanosis 3+ Edema Alert and oriented, grossly normal motor and sensory function Skin Warm and Dry   Assessment and  Plan

## 2011-10-04 NOTE — Assessment & Plan Note (Addendum)
He is volume overloaded. His last renal function was not too bad." To take the liberty of increasing his Lasix from 10-40 alternating with 20 Week or so. Will then have her resume at 20 mg a day and she will need a metabolic profile when asked his doctor Lucia Gaskins

## 2011-10-04 NOTE — Assessment & Plan Note (Addendum)
Continue current medication  I also wonder whether some of his heart failure symptoms may be related to progressive ischemia. I don't have the records in front of me as to when his last stress test or catheterization was. I wonder whether this is worth pursuing

## 2011-10-25 ENCOUNTER — Ambulatory Visit (HOSPITAL_COMMUNITY): Payer: Medicare Other | Attending: Internal Medicine

## 2011-10-25 DIAGNOSIS — R0602 Shortness of breath: Secondary | ICD-10-CM

## 2011-10-25 DIAGNOSIS — Z95810 Presence of automatic (implantable) cardiac defibrillator: Secondary | ICD-10-CM

## 2011-11-14 ENCOUNTER — Other Ambulatory Visit: Payer: Self-pay | Admitting: *Deleted

## 2011-11-14 DIAGNOSIS — I255 Ischemic cardiomyopathy: Secondary | ICD-10-CM

## 2011-11-15 ENCOUNTER — Telehealth: Payer: Self-pay | Admitting: Internal Medicine

## 2011-11-15 NOTE — Telephone Encounter (Signed)
New Problem:     Patient called in claiming that he received a call from this though was unsure what it was regarding.  Please call back.

## 2011-11-15 NOTE — Telephone Encounter (Signed)
Explained to pt the need to come in to have GXT and reprogram his device while here to get better synchronization  He will call us back to schedule once he is near his calender

## 2011-11-16 ENCOUNTER — Encounter: Payer: Medicare Other | Admitting: Internal Medicine

## 2011-11-27 ENCOUNTER — Telehealth: Payer: Self-pay | Admitting: Internal Medicine

## 2011-11-27 ENCOUNTER — Ambulatory Visit (INDEPENDENT_AMBULATORY_CARE_PROVIDER_SITE_OTHER): Payer: Medicare Other | Admitting: Nurse Practitioner

## 2011-11-27 ENCOUNTER — Encounter: Payer: Self-pay | Admitting: Internal Medicine

## 2011-11-27 VITALS — BP 120/73 | Ht 70.0 in | Wt 188.0 lb

## 2011-11-27 DIAGNOSIS — I255 Ischemic cardiomyopathy: Secondary | ICD-10-CM

## 2011-11-27 DIAGNOSIS — I2589 Other forms of chronic ischemic heart disease: Secondary | ICD-10-CM

## 2011-11-27 NOTE — Telephone Encounter (Signed)
New Problem:     Patient called in because he is due in to have a GXT today and would like to know hwat medications he is supposed to hold off. Please call back.

## 2011-11-27 NOTE — Procedures (Signed)
Exercise Treadmill Test  Pre-Exercise Testing Evaluation Rhythm: V paced  Rate: 70   PR:  .86 QRS:  .18  QT:  .50 QTc: .55           Test  Exercise Tolerance Test Ordering MD: Sherryl Manges, MD  Interpreting MD:  Dietrich Pates, MD  Unique Test No: 1 Treadmill:  2  Indication for ETT: SOB and possible reprogram of pacemaker  Contraindication to ETT: No   Stress Modality: exercise - treadmill  Cardiac Imaging Performed: non   Protocol: standard Bruce - symptom limited  Max BP:  140/103  Max MPHR (bpm):  149 85% MPR (bpm):  127  MPHR obtained (bpm):  89 % MPHR obtained:  60%  Reached 85% MPHR (min:sec):  Unable to obtain due to severe SOB and fatigue Total Exercise Time (min-sec):  3:00  Workload in METS:  4.6 Borg Scale: 19  Reason ETT Terminated:  patient's desire to stop;patient severe dyspnea    ST Segment Analysis At Rest: normal ST segments - no evidence of significant ST depression With Exercise: non-specific ST changes  Other Information Arrhythmia:  No Angina during ETT:  absent (0) Quality of ETT:  non-diagnostic  ETT Interpretation: non-diagnostic  Comments: V paced w/ rbbb morphology upon arrival.  With elevation in HR, sinus with 1st deg avb and lbbb morphology.  Test stopped due to DOE @ 3 mins.  No chest pain.  Recommendations: F/u Dr. Graciela Husbands as scheduled.

## 2011-11-27 NOTE — Telephone Encounter (Signed)
Spoke with pt. Reviewed with Di Kindle. Pt not to hold any medications. Pt aware.

## 2011-11-27 NOTE — Progress Notes (Signed)
ecg's from ETT reviewed.  With exercise, pt was NOT in sinus rhythm but appears to be afib/flutter (max rate 89).

## 2011-12-05 ENCOUNTER — Encounter: Payer: Self-pay | Admitting: Internal Medicine

## 2011-12-05 ENCOUNTER — Ambulatory Visit (INDEPENDENT_AMBULATORY_CARE_PROVIDER_SITE_OTHER): Payer: Medicare Other | Admitting: Internal Medicine

## 2011-12-05 VITALS — BP 120/68 | HR 70 | Ht 68.0 in | Wt 189.0 lb

## 2011-12-05 DIAGNOSIS — I255 Ischemic cardiomyopathy: Secondary | ICD-10-CM

## 2011-12-05 DIAGNOSIS — I5022 Chronic systolic (congestive) heart failure: Secondary | ICD-10-CM

## 2011-12-05 DIAGNOSIS — I472 Ventricular tachycardia: Secondary | ICD-10-CM

## 2011-12-05 DIAGNOSIS — I2589 Other forms of chronic ischemic heart disease: Secondary | ICD-10-CM

## 2011-12-05 DIAGNOSIS — I4891 Unspecified atrial fibrillation: Secondary | ICD-10-CM

## 2011-12-05 DIAGNOSIS — Z95810 Presence of automatic (implantable) cardiac defibrillator: Secondary | ICD-10-CM

## 2011-12-05 LAB — ICD DEVICE OBSERVATION
AL THRESHOLD: 0.75 V
ATRIAL PACING ICD: 0.19 pct
ATRIAL PACING ICD: 0.06 pct
BAMS-0001: 140 {beats}/min
BAMS-0003: 70 {beats}/min
BRDY-0002RA: 60 {beats}/min
BRDY-0003RA: 115 {beats}/min
DEVICE MODEL ICD: 604440
DEVICE MODEL ICD: 604440
FVT: 0
FVT: 0
LV LEAD THRESHOLD: 2.5 V
PACEART VT: 0
RV LEAD AMPLITUDE: 8.6 mv
RV LEAD AMPLITUDE: 8.6 mv
RV LEAD THRESHOLD: 0.5 V
TOT-0008: 0
TZON-0003SLOWVT: 335 ms
TZON-0004SLOWVT: 24
TZON-0005SLOWVT: 6
VENTRICULAR PACING ICD: 91 pct
VENTRICULAR PACING ICD: 99.44 pct

## 2011-12-05 NOTE — Assessment & Plan Note (Signed)
We undertook a limited treadmill testing with a more aggressive out of rhythm for rate response. He continued to override our ability to generate rate response. He's been on amiodarone now for about 4 months. His INR is therapeutic. We should anticipate cardioversion. Hopefully he'll be able to maintain sinus rhythm.

## 2011-12-05 NOTE — Assessment & Plan Note (Signed)
Continue current medications. 

## 2011-12-05 NOTE — Progress Notes (Signed)
HPI  Jared Landry is a 71 y.o. male is seen following CRT-D implantation implanted for congestive heart failure in the setting of coronary artery disease atrial fibrillation and diabetes.  He is s/p DCCV in dec2012 for Afib/flutter in the context of amiodarone having been loaded  He reverted within hours. It wasn't clear as to how symptomatic is his atrial fibrillation Remains quite symptomatic. When last year or 2 he has not been able to climb the stairs to his office without pulling himself up. He is able to push the arch. He has moderate shortness of breath  He also has edema  He was sent for cardiopulmonary stress testing where was noted among other things that he lost resynchronization at heart rates above 80. We then scheduled him for stress testing here so that we can reprogram his device. Unfortunately the stress testing was accomplished without the reprogramming. He continues to feel about the same with moderate exercise intolerance   Past Medical History  Diagnosis Date  . Atrial fibrillation     Cardioversion 07/20/11  transiently <3h successful  . CAD (coronary artery disease)     s/p drug eluting stent  . Diabetes mellitus     type II  . Hypertension   . Gout   . Osteoarthritis   . COPD (chronic obstructive pulmonary disease)   . 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 Jude  . Ventricular tachycardia     terminated by the ICD  . Chronic systolic heart failure     Past Surgical History  Procedure Date  . Appendectomy   . Tonsillectomy   . Vasectomy   . Surgical i &d after brown recluse inner thigh   . Cardiac defibrillator placement     St. Jude   . Cardioversion 07/20/2011    Procedure: CARDIOVERSION;  Surgeon: Donato Schultz, MD;  Location: [facility] OR;  Service: Cardiovascular;  Laterality: N/A;    Current Outpatient Prescriptions  Medication Sig Dispense Refill  . albuterol (PROVENTIL HFA;VENTOLIN HFA) 108 (90 BASE) MCG/ACT inhaler  Inhale 2 puffs into the lungs every 6 (six) hours as needed. For shortness of breath.      Marland Kitchen amiodarone (PACERONE) 200 MG tablet Take 1 tablet (200 mg total) by mouth daily.  30 tablet  12  . aspirin 81 MG tablet Take 81 mg by mouth daily.        . ASTAXANTHIN PO Take 1 tablet by mouth daily.       Marland Kitchen atorvastatin (LIPITOR) 10 MG tablet Take 5 mg by mouth daily.       Marland Kitchen b complex vitamins tablet Take 1 tablet by mouth every other day.       . cholecalciferol (VITAMIN D) 1000 UNITS tablet Take 2,000 Units by mouth daily.       . Coenzyme Q10 (CO Q-10) 100 MG CAPS Take 1 tablet by mouth every other day.       . colchicine 0.6 MG tablet Take 0.6 mg by mouth daily.       . Fluticasone-Salmeterol (ADVAIR) 100-50 MCG/DOSE AEPB Inhale 1 puff into the lungs every 12 (twelve) hours.        . furosemide (LASIX) 20 MG tablet Take 10 mg by mouth daily.      . Magnesium 300 MG CAPS Take 1 capsule by mouth every other day.       . metFORMIN (GLUCOPHAGE) 500 MG tablet Take 500 mg by mouth 2 (two) times daily with  a meal.        . metoprolol (TOPROL-XL) 100 MG 24 hr tablet Take 100 mg by mouth daily.        Marland Kitchen warfarin (COUMADIN) 5 MG tablet Take 2.5-5 mg by mouth daily. Takes 1/2 tab on Tuesday, Thursday, saturday Takes 1 tab on Monday,  Wednesday, Friday, and sunday      . Zinc 100 MG TABS Take 1 tablet by mouth every other day.        No current facility-administered medications for this visit.   Facility-Administered Medications Ordered in Other Visits  Medication Dose Route Frequency Provider Last Rate Last Dose  . 0.9 %  sodium chloride infusion  250 mL Intravenous PRN Donato Schultz, MD      . hydrocortisone cream 1 % 1 application  1 application Topical TID PRN Donato Schultz, MD      . sodium chloride 0.9 % injection 3 mL  3 mL Intravenous Q12H Donato Schultz, MD      . sodium chloride 0.9 % injection 3 mL  3 mL Intravenous PRN Donato Schultz, MD        No Known Allergies  Review of Systems negative except  from HPI and PMH  Physical Exam BP 120/68  Pulse 70  Ht 5\' 8"  (1.727 m)  Wt 189 lb (85.73 kg)  BMI 28.74 kg/m2 Well developed and well nourished in no acute distress HENT normal E scleral and icterus clear Neck Supple JVP flat; carotids brisk and full Clear to ausculation *Regular rate and rhythm, no murmurs gallops or rub Soft with active bowel sounds No clubbing cyanosis none Edema Alert and oriented, grossly normal motor and sensory function Skin Warm and Dry   Assessment and  Plan

## 2011-12-05 NOTE — Assessment & Plan Note (Signed)
As above.

## 2011-12-05 NOTE — Patient Instructions (Signed)

## 2011-12-05 NOTE — Assessment & Plan Note (Signed)
The patient's device was interrogated and the information was fully reviewed.  The device was reprogrammed As above  

## 2012-01-11 ENCOUNTER — Encounter (HOSPITAL_COMMUNITY): Payer: Self-pay | Admitting: Pharmacy Technician

## 2012-01-16 ENCOUNTER — Other Ambulatory Visit: Payer: Self-pay | Admitting: Cardiology

## 2012-01-18 ENCOUNTER — Ambulatory Visit (HOSPITAL_COMMUNITY): Payer: Medicare Other | Admitting: Anesthesiology

## 2012-01-18 ENCOUNTER — Encounter (HOSPITAL_COMMUNITY): Payer: Self-pay | Admitting: Anesthesiology

## 2012-01-18 ENCOUNTER — Encounter (HOSPITAL_COMMUNITY)
Admission: RE | Disposition: A | Discharge status: Home / Self Care | Payer: Self-pay | Source: Ambulatory Visit | Attending: Cardiology

## 2012-01-18 ENCOUNTER — Ambulatory Visit (HOSPITAL_COMMUNITY)
Admission: RE | Admit: 2012-01-18 | Discharge: 2012-01-18 | Disposition: A | Discharge status: Home / Self Care | Payer: Medicare Other | Source: Ambulatory Visit | Attending: Cardiology | Admitting: Cardiology

## 2012-01-18 DIAGNOSIS — K219 Gastro-esophageal reflux disease without esophagitis: Secondary | ICD-10-CM | POA: Insufficient documentation

## 2012-01-18 DIAGNOSIS — E119 Type 2 diabetes mellitus without complications: Secondary | ICD-10-CM | POA: Insufficient documentation

## 2012-01-18 DIAGNOSIS — J4489 Other specified chronic obstructive pulmonary disease: Secondary | ICD-10-CM | POA: Insufficient documentation

## 2012-01-18 DIAGNOSIS — I10 Essential (primary) hypertension: Secondary | ICD-10-CM | POA: Insufficient documentation

## 2012-01-18 DIAGNOSIS — J449 Chronic obstructive pulmonary disease, unspecified: Secondary | ICD-10-CM | POA: Insufficient documentation

## 2012-01-18 DIAGNOSIS — I4892 Unspecified atrial flutter: Secondary | ICD-10-CM | POA: Insufficient documentation

## 2012-01-18 HISTORY — PX: CARDIOVERSION: SHX1299

## 2012-01-18 SURGERY — CARDIOVERSION
Anesthesia: General | Wound class: Clean

## 2012-01-18 MED ORDER — LIDOCAINE HCL (CARDIAC) 20 MG/ML IV SOLN
INTRAVENOUS | Status: DC | PRN
  Administered 2012-01-18: 20 mg via INTRAVENOUS

## 2012-01-18 MED ORDER — PROPOFOL 10 MG/ML IV BOLUS
INTRAVENOUS | Status: DC | PRN
  Administered 2012-01-18: 80 mg via INTRAVENOUS

## 2012-01-18 MED ORDER — SODIUM CHLORIDE 0.9 % IJ SOLN: 3.0000 mL | Freq: Two times a day (BID) | INTRAMUSCULAR | Status: DC

## 2012-01-18 MED ORDER — HYDROCORTISONE 1 % EX CREA
1.0000 "application " | TOPICAL_CREAM | Freq: Three times a day (TID) | CUTANEOUS | Status: DC | PRN
  Filled 2012-01-18: qty 28

## 2012-01-18 MED ORDER — SODIUM CHLORIDE 0.9 % IV SOLN: 250.0000 mL | INTRAVENOUS | Status: DC

## 2012-01-18 MED ORDER — SODIUM CHLORIDE 0.9 % IJ SOLN: 3.0000 mL | INTRAMUSCULAR | Status: DC | PRN

## 2012-01-18 NOTE — Anesthesia Postprocedure Evaluation (Signed)
  Anesthesia Post-op Note  Patient: Jared Landry  Procedure(s) Performed: Procedure(s) (LRB): CARDIOVERSION (N/A)  Patient Location: PACU and Short Stay  Anesthesia Type: General  Level of Consciousness: awake and alert   Airway and Oxygen Therapy: Patient Spontanous Breathing  Post-op Pain: none  Post-op Assessment: Post-op Vital signs reviewed, Patient's Cardiovascular Status Stable, Respiratory Function Stable, Patent Airway, No signs of Nausea or vomiting and Pain level controlled  Post-op Vital Signs: stable  Complications: No apparent anesthesia complications

## 2012-01-18 NOTE — Interval H&P Note (Signed)
History and Physical Interval Note:  01/18/2012 12:05 PM  Jared Landry  has presented today for surgery, with the diagnosis of AFIB  The various methods of treatment have been discussed with the patient and family. After consideration of risks, benefits and other options for treatment, the patient has consented to  Procedure(s) (LRB): CARDIOVERSION (N/A) as a surgical intervention .  The patients' history has been reviewed, patient examined, no change in status, stable for surgery.  I have reviewed the patients' chart and labs.  Questions were answered to the patient's satisfaction.     Shondrika Hoque  Ok to proceed. St. Jude Rep coming.

## 2012-01-18 NOTE — Anesthesia Preprocedure Evaluation (Addendum)
Anesthesia Evaluation  Patient identified by MRN, date of birth, ID band Patient awake    Reviewed: Allergy & Precautions, H&P , NPO status , Patient's Chart, lab work & pertinent test results, reviewed documented beta blocker date and time   Airway Mallampati: II TM Distance: >3 FB Neck ROM: Full    Dental   Pulmonary COPDformer smoker + rhonchi         Cardiovascular hypertension, Pt. on medications + CAD + dysrhythmias Atrial Fibrillation + Cardiac Defibrillator Rhythm:Irregular Rate:Normal  Ischemic cardiomyopathy EF about 40% +AICD   Neuro/Psych    GI/Hepatic GERD-  ,  Endo/Other  Diabetes mellitus-  Renal/GU      Musculoskeletal   Abdominal (+) + obese,   Peds  Hematology   Anesthesia Other Findings   Reproductive/Obstetrics                         Anesthesia Physical Anesthesia Plan  ASA: III  Anesthesia Plan: General   Post-op Pain Management:    Induction: Intravenous  Airway Management Planned: Mask  Additional Equipment:   Intra-op Plan:   Post-operative Plan:   Informed Consent: I have reviewed the patients History and Physical, chart, labs and discussed the procedure including the risks, benefits and alternatives for the proposed anesthesia with the patient or authorized representative who has indicated his/her understanding and acceptance.     Plan Discussed with: CRNA and Surgeon  Anesthesia Plan Comments:         Anesthesia Quick Evaluation

## 2012-01-18 NOTE — Discharge Instructions (Signed)
General Anesthetic, Adult A doctor specialized in giving anesthesia (anesthesiologist) or a nurse specialized in giving anesthesia (nurse anesthetist) gives medicine that makes you sleep while a procedure is performed (general anesthetic). Once the general anesthetic has been administered, you will be in a sleeplike state in which you feel no pain. After having a general anestheticyou may feel:   Dizzy.   Weak.   Drowsy.   Confused.  These feelings are normal and can be expected to last for up to 24 hours after the procedure is completed.  LET YOUR CAREGIVER KNOW ABOUT:  Allergies you have.   Medications you are taking, including herbs, eye drops, over the counter medications, dietary supplements, and creams.   Previous problems you have had with anesthetics or numbing medicines.   Use of cigarettes, alcohol, or illicit drugs.   Possibility of pregnancy, if this applies.   History of bleeding or blood disorders, including blood clots and clotting disorders.   Previous surgeries you have had and types of anesthetics you have received.   Family medical history, especially anesthetic problems.   Other health problems.  BEFORE THE PROCEDURE  You may brush your teeth on the morning of surgery but you should have no solid food or non-clear liquids for a minimum of 8 hours prior to your procedure. Clear liquids (water, black coffee, and tea) are acceptable in small amounts until 2 hours prior to your procedure.   You may take your regular medications the morning of your procedure unless your caregiver indicates otherwise.  AFTER THE PROCEDURE  After surgery, you will be taken to the recovery area where a nurse will monitor your progress. You will be allowed to go home when you are awake, stable, taking fluids well, and without serious pain or complications.   For the first 24 hours following an anesthetic:   Have a responsible person with you.   Do not drive a car. If you are  alone, do not take public transportation.   Do not engage in strenuous activity. You may usually resume normal activities the next day, or as advised by your caregiver.   Do not drink alcohol.   Do not take medicine that has not been prescribed by your caregiver.   Do not sign important papers or make important decisions as your judgement may be impaired.   You may resume a normal diet as directed.   Change bandages (dressings) as directed.   Only take over-the-counter or prescription medicines for pain, discomfort, or fever as directed by your caregiver.  If you have questions or problems that seem related to the anesthetic, call the hospital and ask for the anesthetist, anesthesiologist, or anesthesia department. SEEK IMMEDIATE MEDICAL CARE IF:   You develop a rash.   You have difficulty breathing.   You have chest pain.   You have allergic problems.   You have uncontrolled nausea.   You have uncontrolled vomiting.   You develop any serious bleeding, especially from the incision site.  Document Released: 10/31/2007 Document Revised: 07/13/2011 Document Reviewed: 11/24/2010 [employer] Patient Information 2012 [employer]. 

## 2012-01-18 NOTE — H&P (View-Only) (Signed)
Office Visit     Patient: Jared Landry, Jared Landry Provider: Cynthia A. Ferguson, NP  DOB: 06/09/1941 Age: 71 Y Sex: Male Date: 01/16/2012  Phone: [phone]   Address: [address], [place], [place]-[address]  Pcp: KARRAR HUSAIN       Subjective:     CC:    1. MS/CARDIOVERSION WORK UP LABS & SEE JEREMY & SEE LINDA. 2. Will call back with lasix dose. 3. 01/17/12 at 4pm---pt states lasix was 40 mg qd.        HPI:  General:  Mr Babler is a 71-year-old male followed by Dr Namish Krise with hx of prior systolic heart failure, now mostly diastolic heart failure with recent ejection fraction on echocardiogram 10/13/11 of 45-50% with moderate tricuspid regurgitation. He has biventricular ICD and Dr. Klein would like to optimize his device for better synchrony if we are able to convert pt to NSR. He has previously failed cardioversion on antiarrhythmic, amiodarone . Cardiac catheterization 06/07/11 showed patent LAD stent. He is also on chronic anticoagulation for atrial fibrillation, had a prior GI bleed. He returns today to proceed with Cardioversion per Dr Skain He previously was given extra diuretics due to increase swelling which pt feels is now stable. .  Patient denies chest pain, palpitations, dizziness, syncope, nor PND. He feels he has problems with balance. He has chronic SOB if climbing stairs ( he has 15 steps in his home), he has to rest while using them.       ROS:  as noted in HPI, no fever, chills, small amount sputum production occasionally at night related to sinuses. no neurological changes...leg weakness unchanged. He recently had GI virus with diarrhea but feeling better and stools are now soft and returning to normal. no N/V, only diarrhea symptoms developed.       Medical History: Cardiomyopathy - ejection fraction has been less than 35 in the past, most recently on 3/13 - 50%, Atrial fibrillation, on Coumadin (INR goal 2-3), coronary artery disease, s/p drug eleuting stent LAD 2009;  Cath 10/12 ok, GI bleed, type II diabetes , 2005, ophthalmologist Dr. oman, Bi-V ICD (Dr. Klein) 10/2009, Hypertension, Gout, Osteoarthritis, Tobacco use, qiut in 2009, COPD, [facility]- [facility], GERD, Colon Polyp, Colon 2007, BPH, Cataract bilat,awiting surg., Dr Hobert [place]opl[place]- Card, Dr Oman- Optho, Bi-V device - Dr. Steve Klein - cardiopulmonary testing-pacer not performing above heart rate of 88 (loss of biventricular function due to native conduction), chronic kidney disease stage III, creatinine 1.3; hypertensive cardiac related.        Surgical History: Appendectomy , T& A , Vasectomy , Surgical I & D after Brown recluse, inner thigh , Bilat Cataracts 2011.        Family History: Father: deceased 40 yrs ruptured appendix Mother: deceased 60 yrs CHF DM Sister 1: alive PAD Sister 2: alive 2 sister(s) .        Social History:  General: History of smoking cigarettes: Former smoker, Quit in year 2009, Pack-year Hx: 2. no Smoking, quit in 2009 but relapsed. . Alcohol: yes, occasionally, wine. Caffeine: yes. no Recreational drug use. Diet: yes. no Exercise. Occupation: employed, realtor, Commercial. Marital Status: married. Children: girls, 1; son died at 31 with suicide. Seat belt use: yes.        Medications: Co Enzyme Q-10 50 MG Capsule 1 tablet Once a day, B Complex Tablet 1 tab qod, Zinc Capsule 1 tablet qod, OTC vitamin d, astaxanthin for joint inflammation as directed , Advair Diskus 250-50 MCG/DOSE Aerosol   Powder Breath Activated 1 puff as needed, Albuterol Sulfate HFA 108 (90 Base) MCG/ACT Aerosol Solution 2 puffs as needed as needed, Colcrys 0.6 MG Tablet 1 tablet daily, Aspirin 81 MG Tablet Chewable 1 tablet Once a day, Furosemide 40 mg Tablet 1 tablet Once a day, Amiodarone HCl 200 mg Tablet 1 tablet once daily, metformin 500 mg tab 1 tablet twice a day, Metoprolol Succinate 100 MG Tablet Extended Release 24 Hour TAKE 1 TABLET BY MOUTH DAILY , Warfarin Sodium 5 MG Tablet per pharmD 2.5 mg qd, Lipitor  10 MG Tablet 1/2 tablet Once a day, Medication List reviewed and reconciled with the patient       Allergies: N.K.D.A.      Objective:     Vitals: Wt 186, Wt change -5 lb, Ht 67.5, BMI 28.70, Pulse sitting 72, BP sitting 112/70.       Examination:  Cardiology, General:  GENERAL APPEARANCE: pleasant, NAD. HEENT: unremarkable. CAROTID UPSTROKE: normal, no bruit. JVD: flat. HEART SOUNDS: irregularly irregular normal rate, no S3 or S4, normal S1, S2. MURMUR: absent. LUNGS: no rales or wheezes. ABDOMEN: soft, non tender, positive bowel sounds, . EXTREMITIES: trace pretibial bilateral leg edema. PERIPHERAL PULSES: 2 plus bilateral.        Assessment:     Assessment:  1. Atrial fibrillation - 427.31 (Primary), INRs this year remain > 2.0  2. Essential hypertension, benign - 401.1  3. CHF - 428.0    Plan:     1. Atrial fibrillation Continue Aspirin Tablet Chewable, 81 MG, 1 tablet, Orally, Once a day ; Continue Amiodarone HCl Tablet, 200 mg, 1 tablet, Orally, once daily ; Continue Warfarin Sodium Tablet, 5 MG, per pharmD, Orally, 2.5 mg qd .  LAB: Basic Metabolic    GLUCOSE 104 70-99 - mg/dL H   BUN 23 6-26 - mg/dL    CREATININE 1.45 0.60-1.30 - mg/dl H   eGFR (NON-AFRICAN AMERICAN) 48 >60 - calc L   eGFR (AFRICAN AMERICAN) 58 >60 - calc L   SODIUM 131 136-145 - mmol/L L   POTASSIUM 3.2 3.5-5.5 - mmol/L L   CHLORIDE 86 98-107 - mmol/L L   C02 33 22-32 - mg/dL H   ANION GAP 14.7 6.0-20.0 - mmol/L    CALCIUM 8.9 8.6-10.3 - mg/dL     FERGUSON,CYNTHIA A 01/16/2012 04:00:54 PM > precardioversion, pt had recent problems with diarrhea, please call him and confirm his mgs of Lasix he usually takes, have him hold lasix next 2 days since cardioversion is this thursday, and have him take KCL 20 meq 2 today and 2 tomorrow prior to procedure and cc to Dr Jami Bogdanski..he will need RX sent (KCL 20 meq 2 for 2 days and then only as directed give #15, no refills) Plummer,Wanda 01/16/2012 04:20:17 PM >  lmom per dr. Rx sent. Forwarding to Dr. Isamar Nazir    LAB: CBC with Diff    WBC 8.7 4.0-11.0 - K/ul    RBC 3.36 4.20-5.80 - M/uL L   HGB 11.6 13.0-17.0 - g/dL L   HCT 34.7 39.0-52.0 - % L   MCH 34.5 27.0-33.0 - pg H   MPV 7.6 7.5-10.7 - fL    MCV 103.3 80.0-94.0 - fL H   MCHC 33.4 32.0-36.0 - g/dL    RDW 14.5 11.5-15.5 - %    NRBC# 0.01 -    PLT 254 150-400 - K/uL    NEUT % 75.7 43.3-71.9 - % H   NRBC% 0.10 - %      LYMPH% 9.1 16.8-43.5 - % L   MONO % 13.3 4.6-12.4 - % H   EOS % 1.0 0.0-7.8 - %    BASO % 0.9 0.0-1.0 - %    NEUT # 6.6 1.9-7.2 - K/uL    LYMPH# 0.80 1.10-2.70 - K/uL L   MONO # 1.2 0.3-0.8 - K/uL H   EOS # 0.1 0.0-0.6 - K/uL    BASO # 0.1 0.0-0.1 - K/uL     FERGUSON,CYNTHIA A 01/16/2012 01:23:24 PM > as compared to CBC drawn 12/26/11--HGB 11.8, OK for cardioverson    Diagnostic Imaging:EKG Ventricular paced rhythm, no atrialpacing, Boehler,Eileen 01/16/2012 11:22:59 AM >  Plan to proceed with cardioversion per Dr Sherlonda Flater, Reviewed Cardioversion procedure including potential risk including skin irritation, strokes, arrthymias, and reaction to sedatives, INRS have remained > 2.0, today INR 2.7.       2. CHF Continue Metoprolol Succinate Tablet Extended Release 24 Hour, 100 MG, TAKE 1 TABLET BY MOUTH DAILY ; Continue Furosemide Tablet, ? dose, 1 tablet, Once a day .        Immunizations:        Labs:        Procedure Codes: 93000 EKG I AND R, 80048 ECL BMP, 85025 ECL CBC PLATELET DIFF, 36415 BLOOD COLLECTION ROUTINE VENIPUNCTURE       Preventive:         Follow Up: MS pending cardioversion (Reason: Atrial fib post cardioversion)      Provider: Cynthia A. Ferguson, NP  Patient: Spiller, Sharif Landry DOB: 05/17/1941 Date: 01/16/2012    

## 2012-01-18 NOTE — Transfer of Care (Signed)
Immediate Anesthesia Transfer of Care Note  Patient: Jared Landry  Procedure(s) Performed: Procedure(s) (LRB): CARDIOVERSION (N/A)  Patient Location: Short Stay  Anesthesia Type: General  Level of Consciousness: awake, alert , oriented and patient cooperative  Airway & Oxygen Therapy: Patient Spontanous Breathing and Patient connected to nasal cannula oxygen  Post-op Assessment: Report given to PACU RN, Post -op Vital signs reviewed and stable and Patient moving all extremities  Post vital signs: Reviewed and stable  Complications: No apparent anesthesia complications

## 2012-01-18 NOTE — Preoperative (Signed)
Beta Blockers   Reason not to administer Beta Blockers:Not Applicable 

## 2012-01-18 NOTE — H&P (Signed)
Office Visit     Patient: Jared Landry, Payes Provider: Michaell Cowing. Emelda Fear, NP  DOB: November 15, 1940 Age: 71 Y Sex: Male Date: 01/16/2012  Phone: [phone]   Address: [address], [place], [place]  Pcp: Georgann Housekeeper       Subjective:     CC:    1. MS/CARDIOVERSION WORK UP LABS & SEE JEREMY & SEE LINDA. 2. Will call back with lasix dose. 3. 01/17/12 at 4pm---pt states lasix was 40 mg qd.        HPI:  General:  Mr Lukasik is a 71 year old male followed by Dr Anne Fu with hx of prior systolic heart failure, now mostly diastolic heart failure with recent ejection fraction on echocardiogram 10/13/11 of 45-50% with moderate tricuspid regurgitation. He has biventricular ICD and Dr. Graciela Husbands would like to optimize his device for better synchrony if we are able to convert pt to NSR. He has previously failed cardioversion on antiarrhythmic, amiodarone . Cardiac catheterization 06/07/11 showed patent LAD stent. He is also on chronic anticoagulation for atrial fibrillation, had a prior GI bleed. He returns today to proceed with Cardioversion per Dr Caro Hight He previously was given extra diuretics due to increase swelling which pt feels is now stable. .  Patient denies chest pain, palpitations, dizziness, syncope, nor PND. He feels he has problems with balance. He has chronic SOB if climbing stairs ( he has 15 steps in his home), he has to rest while using them.       ROS:  as noted in HPI, no fever, chills, small amount sputum production occasionally at night related to sinuses. no neurological changes...leg weakness unchanged. He recently had GI virus with diarrhea but feeling better and stools are now soft and returning to normal. no N/V, only diarrhea symptoms developed.       Medical History: Cardiomyopathy - ejection fraction has been less than 35 in the past, most recently on 3/13 - 50%, Atrial fibrillation, on Coumadin (INR goal 2-3), coronary artery disease, s/p drug eleuting stent LAD 2009;  Cath 10/12 ok, GI bleed, type II diabetes , 2005, ophthalmologist Dr. Bur[place], Bi-V ICD (Dr. Graciela Husbands) 10/2009, Hypertension, Gout, Osteoarthritis, Tobacco use, qiut in 2009, COPD, [facility]- [facility], GERD, Colon Polyp, Colon 2007, BPH, Cataract bilat,awiting surg., Dr Anne Fu- Card, Dr [place]- [facility], Bi-V device - Dr. Berton Mount - cardiopulmonary testing-pacer not performing above heart rate of 88 (loss of biventricular function due to native conduction), chronic kidney disease stage III, creatinine 1.3; hypertensive cardiac related.        Surgical History: Appendectomy , T& A , Vasectomy , Surgical I & D after Brown recluse, inner thigh , Bilat Cataracts 2011.        Family History: Father: deceased 49 yrs ruptured appendix Mother: deceased 43 yrs CHF DM Sister 1: alive PAD Sister 2: alive 2 sister(s) .        Social History:  General: History of smoking cigarettes: Former smoker, Quit in year 2009, Pack-year Hx: 2. no Smoking, quit in 2009 but relapsed. . Alcohol: yes, occasionally, wine. Caffeine: yes. no Recreational drug use. Diet: yes. no Exercise. Occupation: employed, Veterinary surgeon, Oceanographer. Marital Status: married. Children: girls, 1; son died at 6 with suicide. Seat belt use: yes.        Medications: Co Enzyme Q-10 50 MG Capsule 1 tablet Once a day, B Complex Tablet 1 tab qod, Zinc Capsule 1 tablet qod, OTC vitamin d, astaxanthin for joint inflammation as directed , Advair Diskus 250-50 MCG/DOSE Aerosol  Powder Breath Activated 1 puff as needed, Albuterol Sulfate HFA 108 (90 Base) MCG/ACT Aerosol Solution 2 puffs as needed as needed, Colcrys 0.6 MG Tablet 1 tablet daily, Aspirin 81 MG Tablet Chewable 1 tablet Once a day, Furosemide 40 mg Tablet 1 tablet Once a day, Amiodarone HCl 200 mg Tablet 1 tablet once daily, metformin 500 mg tab 1 tablet twice a day, Metoprolol Succinate 100 MG Tablet Extended Release 24 Hour TAKE 1 TABLET BY MOUTH DAILY , Warfarin Sodium 5 MG Tablet per pharmD 2.5 mg qd, Lipitor  10 MG Tablet 1/2 tablet Once a day, Medication List reviewed and reconciled with the patient       Allergies: N.K.D.A.      Objective:     Vitals: Wt 186, Wt change -5 lb, Ht 67.5, BMI 28.70, Pulse sitting 72, BP sitting 112/70.       Examination:  Cardiology, General:  GENERAL APPEARANCE: pleasant, NAD. HEENT: unremarkable. CAROTID UPSTROKE: normal, no bruit. JVD: flat. HEART SOUNDS: irregularly irregular normal rate, no S3 or S4, normal S1, S2. MURMUR: absent. LUNGS: no rales or wheezes. ABDOMEN: soft, non tender, positive bowel sounds, . EXTREMITIES: trace pretibial bilateral leg edema. PERIPHERAL PULSES: 2 plus bilateral.        Assessment:     Assessment:  1. Atrial fibrillation - 427.31 (Primary), INRs this year remain > 2.0  2. Essential hypertension, benign - 401.1  3. CHF - 428.0    Plan:     1. Atrial fibrillation Continue Aspirin Tablet Chewable, 81 MG, 1 tablet, Orally, Once a day ; Continue Amiodarone HCl Tablet, 200 mg, 1 tablet, Orally, once daily ; Continue Warfarin Sodium Tablet, 5 MG, per pharmD, Orally, 2.5 mg qd .  LAB: Basic Metabolic    GLUCOSE 104 70-99 - mg/dL H   BUN 23 1-61 - mg/dL    CREATININE 0.96 0.45-4.09 - mg/dl H   eGFR (NON-AFRICAN AMERICAN) 48 >60 - calc L   eGFR (AFRICAN AMERICAN) 58 >60 - calc L   SODIUM 131 136-145 - mmol/L L   POTASSIUM 3.2 3.5-5.5 - mmol/L L   CHLORIDE 86 98-107 - mmol/L L   C02 33 22-32 - mg/dL H   ANION GAP 81.1 9.1-47.8 - mmol/L    CALCIUM 8.9 8.6-10.3 - mg/dL     FERGUSON,CYNTHIA A 01/16/2012 04:00:54 PM > precardioversion, pt had recent problems with diarrhea, please call him and confirm his mgs of Lasix he usually takes, have him hold lasix next 2 days since cardioversion is this thursday, and have him take KCL 20 meq 2 today and 2 tomorrow prior to procedure and cc to Dr Anne Fu..he will need RX sent (KCL 20 meq 2 for 2 days and then only as directed give #15, no refills) Plummer,Wanda 01/16/2012 04:20:17 PM >  lmom per dr. Rx sent. Forwarding to Dr. Anne Fu    LAB: CBC with Diff    WBC 8.7 4.0-11.0 - K/ul    RBC 3.36 4.20-5.80 - M/uL L   HGB 11.6 13.0-17.0 - g/dL L   HCT 29.5 62.1-30.8 - % L   MCH 34.5 27.0-33.0 - pg H   MPV 7.6 7.5-10.7 - fL    MCV 103.3 80.0-94.0 - fL H   MCHC 33.4 32.0-36.0 - g/dL    RDW 65.7 84.6-96.2 - %    NRBC# 0.01 -    PLT 254 150-400 - K/uL    NEUT % 75.7 43.3-71.9 - % H   NRBC% 0.10 - %  LYMPH% 9.1 16.8-43.5 - % L   MONO % 13.3 4.6-12.4 - % H   EOS % 1.0 0.0-7.8 - %    BASO % 0.9 0.0-1.0 - %    NEUT # 6.6 1.9-7.2 - K/uL    LYMPH# 0.80 1.10-2.70 - K/uL L   MONO # 1.2 0.3-0.8 - K/uL H   EOS # 0.1 0.0-0.6 - K/uL    BASO # 0.1 0.0-0.1 - K/uL     FERGUSON,CYNTHIA A 01/16/2012 01:23:24 PM > as compared to CBC drawn 12/26/11--HGB 11.8, OK for cardioverson    Diagnostic Imaging:EKG Ventricular paced rhythm, no atrialpacing, Boehler,Eileen 01/16/2012 11:22:59 AM >  Plan to proceed with cardioversion per Dr Anne Fu, Reviewed Cardioversion procedure including potential risk including skin irritation, strokes, arrthymias, and reaction to sedatives, INRS have remained > 2.0, today INR 2.7.       2. CHF Continue Metoprolol Succinate Tablet Extended Release 24 Hour, 100 MG, TAKE 1 TABLET BY MOUTH DAILY ; Continue Furosemide Tablet, ? dose, 1 tablet, Once a day .        Immunizations:        Labs:        Procedure Codes: 04540 EKG I AND R, 80048 ECL BMP, 85025 ECL CBC PLATELET DIFF, 98119 BLOOD COLLECTION ROUTINE VENIPUNCTURE       Preventive:         Follow Up: MS pending cardioversion (Reason: Atrial fib post cardioversion)      Provider: Michaell Cowing. Emelda Fear, NP  Patient: Montae, Stager DOB: [birth year] Date: 01/16/2012

## 2012-01-18 NOTE — Anesthesia Postprocedure Evaluation (Signed)
  Anesthesia Post-op Note  Patient: Jared Landry  Procedure(s) Performed: Procedure(s) (LRB): CARDIOVERSION (N/A)  Patient Location: Short Stay  Anesthesia Type: General  Level of Consciousness: awake, alert , oriented and patient cooperative  Airway and Oxygen Therapy: Patient Spontanous Breathing and Patient connected to nasal cannula oxygen  Post-op Pain: none  Post-op Assessment: Post-op Vital signs reviewed, Patient's Cardiovascular Status Stable, Respiratory Function Stable, Patent Airway and No signs of Nausea or vomiting  Post-op Vital Signs: Reviewed and stable  Complications: No apparent anesthesia complications

## 2012-01-18 NOTE — CV Procedure (Signed)
Procedure: Electrical Cardioversion  Indications: Atrial Flutter  Time Out: Verified patient identification, verified procedure,medications/allergies/relevent history reviewed, required imaging and test results available. time out performed  Procedure Details  The patient was NPO after midnight. Anesthesia was administered at the beside by Dr.Kasik with 90mg  of propofol. Cardioversion was performed with synchronized biphasic defibrillation via AP pads with 150 joules. One attempt(s) were performed. The patient converted to normal sinus rhythm. The patient tolerated the procedure well. [employer] Jude device interrogated pre and post. Rate set at 70. Atrial flutter noted at rate of 154 prior to procedure. NSR 70 after.  IMPRESSION:  Successful cardioversion of atrial flutter.  Flutter was seen on device interrogation only.  Now NSR. Bi V.  Will see in follow up.

## 2012-01-19 ENCOUNTER — Encounter (HOSPITAL_COMMUNITY): Payer: Self-pay | Admitting: Cardiology

## 2012-01-19 LAB — GLUCOSE, CAPILLARY: Glucose-Capillary: 94 mg/dL (ref 70–99)

## 2012-01-23 ENCOUNTER — Encounter: Payer: Self-pay | Admitting: Internal Medicine

## 2012-03-07 ENCOUNTER — Encounter: Payer: Self-pay | Admitting: Internal Medicine

## 2012-03-07 ENCOUNTER — Ambulatory Visit (INDEPENDENT_AMBULATORY_CARE_PROVIDER_SITE_OTHER): Payer: Medicare Other | Admitting: *Deleted

## 2012-03-07 DIAGNOSIS — I472 Ventricular tachycardia, unspecified: Secondary | ICD-10-CM

## 2012-03-07 DIAGNOSIS — Z95810 Presence of automatic (implantable) cardiac defibrillator: Secondary | ICD-10-CM

## 2012-03-07 DIAGNOSIS — I5022 Chronic systolic (congestive) heart failure: Secondary | ICD-10-CM

## 2012-03-08 LAB — REMOTE ICD DEVICE
AL IMPEDENCE ICD: 360 Ohm
BAMS-0003: 70 {beats}/min
BRDY-0002RV: 70 {beats}/min
BRDY-0003RV: 120 {beats}/min
LV LEAD IMPEDENCE ICD: 350 Ohm
RV LEAD IMPEDENCE ICD: 440 Ohm
TZON-0005SLOWVT: 6

## 2012-04-03 ENCOUNTER — Encounter: Payer: Self-pay | Admitting: *Deleted

## 2012-06-10 ENCOUNTER — Encounter: Payer: Self-pay | Admitting: Internal Medicine

## 2012-06-10 ENCOUNTER — Ambulatory Visit (INDEPENDENT_AMBULATORY_CARE_PROVIDER_SITE_OTHER): Payer: Medicare Other | Admitting: *Deleted

## 2012-06-10 DIAGNOSIS — I5022 Chronic systolic (congestive) heart failure: Secondary | ICD-10-CM

## 2012-06-10 DIAGNOSIS — Z95810 Presence of automatic (implantable) cardiac defibrillator: Secondary | ICD-10-CM

## 2012-06-10 DIAGNOSIS — I472 Ventricular tachycardia, unspecified: Secondary | ICD-10-CM

## 2012-06-10 LAB — REMOTE ICD DEVICE
AL IMPEDENCE ICD: 390 Ohm
BAMS-0001: 140 {beats}/min
BRDY-0002RV: 70 {beats}/min
BRDY-0004RV: 120 {beats}/min
DEVICE MODEL ICD: 604440
LV LEAD IMPEDENCE ICD: 380 Ohm
TZON-0005SLOWVT: 6
TZON-0010SLOWVT: 80 ms
VENTRICULAR PACING ICD: 98 pct

## 2012-07-09 ENCOUNTER — Encounter: Payer: Self-pay | Admitting: *Deleted

## 2012-09-16 ENCOUNTER — Ambulatory Visit (INDEPENDENT_AMBULATORY_CARE_PROVIDER_SITE_OTHER): Payer: Medicare Other | Admitting: *Deleted

## 2012-09-16 ENCOUNTER — Other Ambulatory Visit: Payer: Self-pay

## 2012-09-16 ENCOUNTER — Encounter: Payer: Self-pay | Admitting: Internal Medicine

## 2012-09-16 DIAGNOSIS — I472 Ventricular tachycardia, unspecified: Secondary | ICD-10-CM

## 2012-09-16 DIAGNOSIS — Z95810 Presence of automatic (implantable) cardiac defibrillator: Secondary | ICD-10-CM

## 2012-09-16 DIAGNOSIS — I5022 Chronic systolic (congestive) heart failure: Secondary | ICD-10-CM

## 2012-09-20 LAB — REMOTE ICD DEVICE
AL IMPEDENCE ICD: 350 Ohm
DEVICE MODEL ICD: 604440
HV IMPEDENCE: 51 Ohm
LV LEAD IMPEDENCE ICD: 340 Ohm
RV LEAD IMPEDENCE ICD: 390 Ohm
TZON-0003SLOWVT: 335 ms
TZON-0004SLOWVT: 24
TZON-0005SLOWVT: 6
TZON-0010SLOWVT: 80 ms
VENTRICULAR PACING ICD: 96 pct

## 2012-10-01 ENCOUNTER — Encounter: Payer: Self-pay | Admitting: *Deleted

## 2012-12-26 ENCOUNTER — Other Ambulatory Visit: Payer: Self-pay | Admitting: Internal Medicine

## 2012-12-26 DIAGNOSIS — R2689 Other abnormalities of gait and mobility: Secondary | ICD-10-CM

## 2012-12-31 ENCOUNTER — Ambulatory Visit
Admission: RE | Admit: 2012-12-31 | Discharge: 2012-12-31 | Disposition: A | Discharge status: Home / Self Care | Payer: Medicare Other | Source: Ambulatory Visit | Attending: Internal Medicine | Admitting: Internal Medicine

## 2012-12-31 DIAGNOSIS — R2689 Other abnormalities of gait and mobility: Secondary | ICD-10-CM

## 2013-01-02 ENCOUNTER — Encounter: Payer: Medicare Other | Admitting: Internal Medicine

## 2013-01-03 ENCOUNTER — Encounter: Payer: Self-pay | Admitting: Cardiology

## 2013-01-03 ENCOUNTER — Ambulatory Visit (INDEPENDENT_AMBULATORY_CARE_PROVIDER_SITE_OTHER): Payer: Medicare Other | Admitting: Cardiology

## 2013-01-03 VITALS — BP 120/80 | HR 94 | Ht 67.0 in | Wt 185.0 lb

## 2013-01-03 DIAGNOSIS — I4891 Unspecified atrial fibrillation: Secondary | ICD-10-CM

## 2013-01-03 DIAGNOSIS — I255 Ischemic cardiomyopathy: Secondary | ICD-10-CM

## 2013-01-03 DIAGNOSIS — I498 Other specified cardiac arrhythmias: Secondary | ICD-10-CM

## 2013-01-03 DIAGNOSIS — I2510 Atherosclerotic heart disease of native coronary artery without angina pectoris: Secondary | ICD-10-CM

## 2013-01-03 DIAGNOSIS — I4719 Other supraventricular tachycardia: Secondary | ICD-10-CM

## 2013-01-03 DIAGNOSIS — I472 Ventricular tachycardia, unspecified: Secondary | ICD-10-CM

## 2013-01-03 DIAGNOSIS — I5022 Chronic systolic (congestive) heart failure: Secondary | ICD-10-CM

## 2013-01-03 DIAGNOSIS — Z95810 Presence of automatic (implantable) cardiac defibrillator: Secondary | ICD-10-CM

## 2013-01-03 DIAGNOSIS — I2589 Other forms of chronic ischemic heart disease: Secondary | ICD-10-CM

## 2013-01-03 DIAGNOSIS — I471 Supraventricular tachycardia: Secondary | ICD-10-CM

## 2013-01-03 NOTE — Patient Instructions (Addendum)
Remote Device Check Wednesday 01/08/13     Your physician wants you to follow-up in: 6 months with Dr.Klein. You will receive a reminder letter in the mail two months in advance. If you don't receive a letter, please call our office to schedule the follow-up appointment.

## 2013-01-07 LAB — ICD DEVICE OBSERVATION
AL IMPEDENCE ICD: 350 Ohm
ATRIAL PACING ICD: 39 pct
BAMS-0003: 70 {beats}/min
HV IMPEDENCE: 57 Ohm
LV LEAD IMPEDENCE ICD: 350 Ohm
LV LEAD THRESHOLD: 2.5 V
RV LEAD AMPLITUDE: 11.4 mv
RV LEAD THRESHOLD: 0.5 V
TZON-0003SLOWVT: 335 ms
TZON-0010SLOWVT: 80 ms
VF: 0

## 2013-01-07 NOTE — Progress Notes (Addendum)
ELECTROPHYSIOLOGY OFFICE NOTE  Patient ID: Jared Landry MRN: [id number], DOB/AGE: 1941/07/12   Date of Visit: 01/07/2013  Primary Physician: Georgann Housekeeper, MD Primary Cardiologist / EP: Anne Fu, MD / Graciela Husbands, MD Reason for Visit: EP/device follow-up  History of Present Illness  Jared Landry is a very pleasant 72 year old man with an ischemic CM and chronic systolic HF s/p CRT-D, paroxysmal VT, PAF, CAD and COPD who presents today for routine EP follow-up.   He is s/p DCCV in Dec 2012 for Afib/flutter in the context of amiodarone having been loaded He reverted within hours. It wasn't clear at that time how symptomatic his atrial fibrillation was; however, when he saw Dr. Graciela Husbands in Jan 2013 he remained quite symptomatic from a HF standpoint. He was not able to climb stairs to his office without having significant DOE. He was also having increased LE edema. He was sent for cardiopulmonary stress testing where it was noted that he lost resynchronization at heart rates above 80. We then scheduled him for stress testing/GXT at our office which was done April 2013 with reprogramming of his device.  He presents today and reports chronic DOE but denies worsening from baseline. He denies chest pain or shortness of breath at rest. He denies palpitations, dizziness, near syncope or syncope. He denies increased LE swelling. He denies orthopnea, PND or recent weight gain. He denies ICD shocks. Jared Landry states that he is compliant and tolerating medications without difficulty.  Past Medical History Past Medical History  Diagnosis Date  . Atrial fibrillation     Cardioversion 07/20/11  transiently <3h successful  . CAD (coronary artery disease)     s/p drug eluting stent  . Diabetes mellitus     type II  . Hypertension   . Gout   . Osteoarthritis   . COPD (chronic obstructive pulmonary disease)   . 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 Jude  . Ventricular tachycardia      terminated by the ICD  . Chronic systolic heart failure     Past Surgical History Past Surgical History  Procedure Laterality Date  . Appendectomy    . Tonsillectomy    . Vasectomy    . Surgical i &d after brown recluse inner thigh    . Cardiac defibrillator placement      St. Jude   . Cardioversion  07/20/2011    Procedure: CARDIOVERSION;  Surgeon: Donato Schultz, MD;  Location: [facility] OR;  Service: Cardiovascular;  Laterality: N/A;  . Cardioversion  01/18/2012    Procedure: CARDIOVERSION;  Surgeon: Donato Schultz, MD;  Location: [facility] OR;  Service: Cardiovascular;  Laterality: N/A;    Allergies/Intolerances No Known Allergies  Current Home Medications Current Outpatient Prescriptions  Medication Sig Dispense Refill  . albuterol (PROVENTIL HFA;VENTOLIN HFA) 108 (90 BASE) MCG/ACT inhaler Inhale 2 puffs into the lungs every 6 (six) hours as needed. For shortness of breath.      Marland Kitchen aspirin EC 81 MG tablet Take 81 mg by mouth daily.      . Astaxanthin 4 MG CAPS Take 1 capsule by mouth daily.      Marland Kitchen atorvastatin (LIPITOR) 10 MG tablet Take 5 mg by mouth daily.       Marland Kitchen b complex vitamins tablet Take 1 tablet by mouth every other day. Alternate with Zinc      . cholecalciferol (VITAMIN D) 1000 UNITS tablet Take 2,000 Units by mouth daily.       Marland Kitchen  Coenzyme Q10 (CO Q-10) 100 MG CAPS Take 1 tablet by mouth every other day. Alternate with Magnesium.      . colchicine 0.6 MG tablet Take 0.6 mg by mouth every other day.      . Fluticasone-Salmeterol (ADVAIR) 100-50 MCG/DOSE AEPB Inhale 1 puff into the lungs every 12 (twelve) hours.        . furosemide (LASIX) 20 MG tablet Take 20 mg by mouth daily.      . Magnesium 300 MG CAPS Take 1 capsule by mouth every other day. Alternate with Co Q-10      . metFORMIN (GLUCOPHAGE) 500 MG tablet Take 500 mg by mouth 2 (two) times daily with a meal.        . metoprolol (TOPROL-XL) 100 MG 24 hr tablet Take 100 mg by mouth daily.        Marland Kitchen warfarin (COUMADIN) 7.5 MG  tablet Take 3.75 mg by mouth daily.      . Zinc 100 MG TABS Take 1 tablet by mouth every other day. Alternate with B Complex      . amiodarone (PACERONE) 200 MG tablet Take 1 tablet (200 mg total) by mouth daily.  30 tablet  12   Social History Social History  . Marital Status: Married   Social History Main Topics  . Smoking status: Former Smoker -- 0.30 packs/day    Types: Cigarettes    Quit date: 10/29/2010  . Smokeless tobacco: No  . Alcohol Use: 16.8 oz/week    28 Shots of liquor per week  . Drug Use: No   Review of Systems General: No chills, fever, night sweats or weight changes Cardiovascular: No chest pain, orthopnea, palpitations, paroxysmal nocturnal dyspnea Dermatological: No rash, lesions or masses Respiratory: No cough, dyspnea Urologic: No hematuria, dysuria Abdominal: No nausea, vomiting, diarrhea, bright red blood per rectum, melena, or hematemesis Neurologic: No visual changes, weakness, changes in mental status All other systems reviewed and are otherwise negative except as noted above.  Physical Exam Blood pressure 120/80, pulse 94, height 5\' 7"  (1.702 m), weight 185 lb (83.915 kg).  General: Well developed, well appearing 72 year old male in no acute distress. HEENT: Normocephalic, atraumatic. EOMs intact. Sclera nonicteric. Oropharynx clear.  Neck: Supple. No JVD. Lungs: Respirations regular and unlabored, CTA bilaterally. No wheezes, rales or rhonchi. Heart: Tachycardic, regular. S1, S2 present. No murmur, rub, S3 or S4. Abdomen: Soft, non-distended.  Extremities: No clubbing, cyanosis or edema. DP/PT/Radials 2+ and equal bilaterally. Psych: Normal affect. Neuro: Alert and oriented X 3. Moves all extremities spontaneously.   Diagnostics Device interrogation today - Unable to confirm atrial threshold due to persistent atrial tachycardia. Dr. Graciela Husbands reviewed and attempted to pace terminate/atrial NIPS which was unsuccessful. Max track rate lowered to 100 bpm  and AT detection rate lowered to 120 bpm. Otherwise thresholds and sensing consistent with previous device measurements. Lead impedance trends stable over time. 92,567 AMS episodes, mode switched 57% of time. No ventricular arrhythmia episodes recorded. Patient bi-ventricularly pacing 97% of the time. Device programmed with appropriate safety margins. Heart failure diagnostics reviewed and trends are stable for patient. Estimated longevity 1 year. Patient enrolled in remote follow up. Plan to check device remotely on Tuesday 01/08/2013 for follow-up on atrial tachycardia. Then continue remote follow-up every 3 months and see in office in 6 months.   Assessment and Plan 1. Atrial tachycardia He is currently tracking his AT at 120-122 bpm. He is asymptomatic currently.  Dr. Graciela Husbands attempted to pace  terminate with atrial NIPS but was unsuccessful. His device was reprogrammed as outlined above so that he will not track his AT.  He will send a remote transmission for rhythm follow-up on Tues, 01/07/2013. Further recommendations pending those results.   2. Ischemic CM with chronic systolic HF s/p CRT-D implant Normal device function Programming changes made as outlined above Continue routine remote device follow-up every 3 months Return to clinic for follow-up with me or Dr. Graciela Husbands in 6 months 3. PAF By device interrogation today, he is mode switched 57% of the time. Continue amiodarone and BB as previously directed by Dr. Graciela Husbands. Continue warfarin for embolic prophylaxis. 4. Paroxysmal VT Stable; no evidence of ventricular arrhythmias on interrogation today Continue amiodarone and BB 5. CAD Stable without anginal symptoms Continue medical therapy Keep scheduled follow-up with Dr. Anne Fu  The above plan of care was formulated with Dr. Graciela Husbands who was in to interview and examine the patient.  Signed, [name], PA-C 01/07/2013, 4:28 PM  ADDENDUM: Received Jared Landry remote transmission and  reviewed findings with Dr. Graciela Husbands. His heart rate has improved but he is still tachycardic at 100 bpm. Jared Landry will increase his metoprolol succinate to 150 mg once daily. In addition, it appears he has atrial undersensing at times; therefore, we will have him return to clinic for reprogramming - specifically to turn atrial auto-sense off.

## 2013-01-08 ENCOUNTER — Encounter: Payer: Self-pay | Admitting: Cardiology

## 2013-01-08 ENCOUNTER — Encounter: Payer: Self-pay | Admitting: Internal Medicine

## 2013-01-09 ENCOUNTER — Telehealth: Payer: Self-pay | Admitting: *Deleted

## 2013-01-09 DIAGNOSIS — I4891 Unspecified atrial fibrillation: Secondary | ICD-10-CM

## 2013-01-09 MED ORDER — METOPROLOL SUCCINATE ER 100 MG PO TB24: ORAL_TABLET | ORAL | Status: DC

## 2013-01-09 NOTE — Telephone Encounter (Signed)
I spoke with the patient and made him aware of Dr. Odessa Fleming recommendations per Nehemiah Settle. He is agreeable to increasing metoprolol succ to 150 mg once daily. I will send his RX in to [facility] [address] for him.

## 2013-01-09 NOTE — Telephone Encounter (Signed)
6/5-Jared Landry, Please call Mr. Rosenzweig when you can and explain that we received his remote transmission and Dr. Graciela Husbands has reviewed it. His heart rate is improved but still slightly elevated so he needs to increase his metoprolol succinate dose to 150 mg once daily (he is currently taking 100 mg daily). I tried calling him twice today with no answer or answering machine/voicemail. I appreciate your help! Thanks so much, Brooke  I left a message for the patient to call on his contact #. Sherri Rad, RN, BSN

## 2013-01-29 ENCOUNTER — Encounter: Payer: Self-pay | Admitting: Internal Medicine

## 2013-04-08 ENCOUNTER — Ambulatory Visit (INDEPENDENT_AMBULATORY_CARE_PROVIDER_SITE_OTHER): Payer: Medicare Other | Admitting: *Deleted

## 2013-04-08 DIAGNOSIS — I2589 Other forms of chronic ischemic heart disease: Secondary | ICD-10-CM

## 2013-04-08 DIAGNOSIS — Z95810 Presence of automatic (implantable) cardiac defibrillator: Secondary | ICD-10-CM

## 2013-04-08 DIAGNOSIS — I255 Ischemic cardiomyopathy: Secondary | ICD-10-CM

## 2013-04-08 DIAGNOSIS — I5022 Chronic systolic (congestive) heart failure: Secondary | ICD-10-CM

## 2013-04-09 LAB — REMOTE ICD DEVICE
BAMS-0001: 120 {beats}/min
BAMS-0003: 70 {beats}/min
BRDY-0002RV: 70 {beats}/min
DEVICE MODEL ICD: 604440
LV LEAD IMPEDENCE ICD: 330 Ohm
RV LEAD AMPLITUDE: 11.4 mv
RV LEAD IMPEDENCE ICD: 390 Ohm
TZON-0003SLOWVT: 335 ms
TZON-0004SLOWVT: 24
TZON-0005SLOWVT: 6

## 2013-04-11 ENCOUNTER — Ambulatory Visit (INDEPENDENT_AMBULATORY_CARE_PROVIDER_SITE_OTHER): Payer: Medicare Other | Admitting: Diagnostic Neuroimaging

## 2013-04-11 ENCOUNTER — Encounter: Payer: Self-pay | Admitting: Diagnostic Neuroimaging

## 2013-04-11 VITALS — BP 132/74 | HR 73 | Temp 98.0°F | Ht 68.0 in | Wt 189.0 lb

## 2013-04-11 DIAGNOSIS — R269 Unspecified abnormalities of gait and mobility: Secondary | ICD-10-CM | POA: Insufficient documentation

## 2013-04-11 DIAGNOSIS — E1142 Type 2 diabetes mellitus with diabetic polyneuropathy: Secondary | ICD-10-CM

## 2013-04-11 DIAGNOSIS — E1140 Type 2 diabetes mellitus with diabetic neuropathy, unspecified: Secondary | ICD-10-CM

## 2013-04-11 DIAGNOSIS — E1149 Type 2 diabetes mellitus with other diabetic neurological complication: Secondary | ICD-10-CM

## 2013-04-11 DIAGNOSIS — M6281 Muscle weakness (generalized): Secondary | ICD-10-CM | POA: Insufficient documentation

## 2013-04-11 NOTE — Patient Instructions (Signed)
I will check labs and EMG (nerve/muscle test).  Consider physical therapy (ACT, neurorehab, home health).

## 2013-04-11 NOTE — Progress Notes (Signed)
[facility]  PATIENT: Jared Landry DOB: 01/19/1941  REFERRING CLINICIAN: k hUSSAIN HISTORY FROM: patient REASON FOR VISIT: new consult   HISTORICAL  CHIEF COMPLAINT:  Chief Complaint  Patient presents with  . Neurologic Problem    confussion, memory loss ,tremors NP #7    HISTORY OF PRESENT ILLNESS:   72 year old male with history of atrial fibrillation, status post cardioversion, coronary disease status post in, diabetes, hypertension, gout, osteoporosis, cardiac defibrillator placement, here for evaluation of gait and balance difficulty.  Past 4-5 years patient has developed progressive balance problems. Initially he felt a sensation of being pulled backwards or being pushed forward. He tripped and fell 2 times early on in his symptoms. Patient has a difficult time when he stands up and also if he closes his eyes. He started using a cane to help him out for the past 2-3 years. He also feels weakness in his hips and upper legs. He denies any neck pain or back pain. No problems with his arms. He has difficulty with turning. Overall his slowed down quite a bit.  Of note patient noticed some leg weakness that started when he was 72 years old. This gradually progressed over the years but has significantly worsen the last 4-5 years.  Other symptoms the patient is those are mild memory problems, mild confusion, sense of falling down when he is about to fall asleep, generalized fatigue.  Patient also had several episodes of syncope previously, thought to be related to low blood pressure. Since reducing his blood pressure medication these episodes have stopped.  REVIEW OF SYSTEMS: Full 14 system review of systems performed and notable only for fatigue easy bruising feeling cold diarrhea constipation urination problems impotence confusion slurred speech balance.  ALLERGIES: No Known Allergies  HOME MEDICATIONS: Outpatient Prescriptions Prior to Visit  Medication  Sig Dispense Refill  . albuterol (PROVENTIL HFA;VENTOLIN HFA) 108 (90 BASE) MCG/ACT inhaler Inhale 2 puffs into the lungs every 6 (six) hours as needed. For shortness of breath.      Marland Kitchen aspirin EC 81 MG tablet Take 81 mg by mouth daily.      . Astaxanthin 4 MG CAPS Take 1 capsule by mouth daily.      Marland Kitchen atorvastatin (LIPITOR) 10 MG tablet Take 5 mg by mouth daily.       Marland Kitchen b complex vitamins tablet Take 1 tablet by mouth every other day. Alternate with Zinc      . cholecalciferol (VITAMIN D) 1000 UNITS tablet Take 2,000 Units by mouth daily.       . Coenzyme Q10 (CO Q-10) 100 MG CAPS Take 1 tablet by mouth every other day. Alternate with Magnesium.      . colchicine 0.6 MG tablet Take 0.6 mg by mouth every other day.      . Fluticasone-Salmeterol (ADVAIR) 100-50 MCG/DOSE AEPB Inhale 1 puff into the lungs every 12 (twelve) hours.        . furosemide (LASIX) 20 MG tablet Take 20 mg by mouth daily.      . Magnesium 300 MG CAPS Take 1 capsule by mouth every other day. Alternate with Co Q-10      . metoprolol succinate (TOPROL-XL) 100 MG 24 hr tablet Take 1 & 1/2 tablets by mouth once daily  45 tablet  6  . warfarin (COUMADIN) 7.5 MG tablet Take 3.75 mg by mouth daily.      . Zinc 100 MG TABS Take 1 tablet by mouth every other day.  Alternate with B Complex      . metFORMIN (GLUCOPHAGE) 500 MG tablet Take 500 mg by mouth 2 (two) times daily with a meal.        . amiodarone (PACERONE) 200 MG tablet Take 1 tablet (200 mg total) by mouth daily.  30 tablet  12   Facility-Administered Medications Prior to Visit  Medication Dose Route Frequency Provider Last Rate Last Dose  . 0.9 %  sodium chloride infusion  250 mL Intravenous PRN Donato Schultz, MD      . hydrocortisone cream 1 % 1 application  1 application Topical TID PRN Donato Schultz, MD      . sodium chloride 0.9 % injection 3 mL  3 mL Intravenous Q12H Donato Schultz, MD      . sodium chloride 0.9 % injection 3 mL  3 mL Intravenous PRN Donato Schultz, MD         PAST MEDICAL HISTORY: Past Medical History  Diagnosis Date  . Atrial fibrillation     Cardioversion 07/20/11  transiently <3h successful  . CAD (coronary artery disease)     s/p drug eluting stent  . Diabetes mellitus     type II  . Hypertension   . Gout   . Osteoarthritis   . COPD (chronic obstructive pulmonary disease)   . 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 Jude  . Ventricular tachycardia     terminated by the ICD  . Chronic systolic heart failure     PAST SURGICAL HISTORY: Past Surgical History  Procedure Laterality Date  . Appendectomy    . Tonsillectomy    . Vasectomy    . Surgical i &d after brown recluse inner thigh    . Cardiac defibrillator placement      St. Jude   . Cardioversion  07/20/2011    Procedure: CARDIOVERSION;  Surgeon: Donato Schultz, MD;  Location: [facility] OR;  Service: Cardiovascular;  Laterality: N/A;  . Cardioversion  01/18/2012    Procedure: CARDIOVERSION;  Surgeon: Donato Schultz, MD;  Location: [facility] OR;  Service: Cardiovascular;  Laterality: N/A;    FAMILY HISTORY: Family History  Problem Relation Age of Onset  . Heart failure Mother   . Diabetes type II Sister   . Appendicitis Father     rupture    SOCIAL HISTORY:  History   Social History  . Marital Status: Married    Spouse Name: N/A    Number of Children: N/A  . Years of Education: N/A   Occupational History  . Not on file.   Social History Main Topics  . Smoking status: Former Smoker -- 0.30 packs/day    Types: Cigarettes    Quit date: 10/29/2010  . Smokeless tobacco: Not on file  . Alcohol Use: 16.8 oz/week    28 Shots of liquor per week  . Drug Use: No  . Sexual Activity: Not on file   Other Topics Concern  . Not on file   Social History Narrative  . No narrative on file     PHYSICAL EXAM  Filed Vitals:   04/11/13 1032  BP: 132/74  Pulse: 73  Temp: 98 F (36.7 C)  TempSrc: Oral  Height: 5\' 8"  (1.727 m)  Weight: 189 lb  (85.73 kg)    Not recorded    Body mass index is 28.74 kg/(m^2).  GENERAL EXAM: Patient is in no distress  CARDIOVASCULAR: Regular rate and rhythm, no murmurs, no carotid bruits  NEUROLOGIC: MENTAL STATUS:  awake, alert, language fluent, comprehension intact, naming intact; MMSE 30/30. NO FRONTAL RELEASE SIGNS. CRANIAL NERVE: no papilledema on fundoscopic exam, pupils equal and reactive to light, visual fields full to confrontation, extraocular muscles intact, no nystagmus, facial sensation and strength symmetric, uvula midline, shoulder shrug symmetric, tongue midline. MOTOR: normal bulk and tone, full strength in the BUE. BLE (HIP FLEX 3, KE 4, KF 4, DF 5, PF 5).  SENSORY: DECR TO LT, VIB (5SEC AT TOES), IN FEET. COORDINATION: finger-nose-finger, fine finger movements normal REFLEXES: deep tendon reflexes present and symmetric GAIT/STATION: SLOW TO RISE. SLOW, SHORT STEPS. CAUTIOUS TURNING. TIMED UP AND GO (10FT) = 20 SECONDS. narrow based gait; able to walk on toes, heels; CANNOT TANDEM. romberg is BORDERLING   DIAGNOSTIC DATA (LABS, IMAGING, TESTING) - I reviewed patient records, labs, notes, testing and imaging myself where available.  Lab Results  Component Value Date   WBC 7.7 05/31/2011   HGB 11.4* 05/31/2011   HCT 33.3* 05/31/2011   MCV 101.2* 05/31/2011   PLT 291.0 05/31/2011      Component Value Date/Time   NA 137 05/31/2011 1324   K 4.7 05/31/2011 1324   CL 97 05/31/2011 1324   CO2 27 05/31/2011 1324   GLUCOSE 119* 05/31/2011 1324   BUN 28* 05/31/2011 1324   CREATININE 0.8 05/31/2011 1324   CALCIUM 9.5 05/31/2011 1324   GFRNONAA 70.39 10/29/2009 1237   GFRAA  Value: >60        The eGFR has been calculated using the MDRD equation. This calculation has not been validated in all clinical 10/19/2007 0315   Lab Results  Component Value Date   CHOL  Value: 137        ATP III CLASSIFICATION:  <200     mg/dL   Desirable  161-096  mg/dL   Borderline High  >=045     mg/dL   High 11/13/8117   HDL 62 10/17/2007   LDLCALC  Value: 68        Total Cholesterol/HDL:CHD Risk Coronary Heart Disease Risk Table                     Men   Women  1/2 Average Risk   3.4   3.3 10/17/2007   TRIG 33 10/17/2007   CHOLHDL 2.2 10/17/2007   No results found for this basename: HGBA1C   No results found for this basename: VITAMINB12   Lab Results  Component Value Date   TSH 0.95 05/31/2011    A1c - 5.7  TSH - 7.18 (h)  B12 - 318  12/31/12 CT HEAD - mild atrophy; no acute findings.   ASSESSMENT AND PLAN  72 y.o. year old male here with progressive date and balance difficulties over past 4-5 years. Exam notable for proximal muscle weakness in the lower extremities, decreased sensation in the feet. May represent myopathy, neuropathy or lumbar radiculopathy. I will check CK and aldolase levels to screen for muscle disease. Will check EMG nerve conduction study to further localize. I recommend another course of physical therapy with focus on balance and strength training.  No clear etiology of subjective memory complaints and confusion (MMSE 30/30). Could be medication side effect.    Orders Placed This Encounter  Procedures  . CK  . Aldolase  . NCV with EMG(electromyography)   Return for emg/NCS.    Suanne Marker, MD 04/11/2013, 11:31 AM Certified in Neurology, Neurophysiology and Neuroimaging  [facility] [address], [address] [place],  [place] [address] [phone]

## 2013-04-18 ENCOUNTER — Other Ambulatory Visit (INDEPENDENT_AMBULATORY_CARE_PROVIDER_SITE_OTHER): Payer: Self-pay

## 2013-04-18 DIAGNOSIS — Z0289 Encounter for other administrative examinations: Secondary | ICD-10-CM

## 2013-04-20 LAB — CK: Total CK: 42 U/L (ref 24–204)

## 2013-04-21 NOTE — Progress Notes (Signed)
ICD remote. All functions normal, no NSVT. Full details in [employer].  ROV w/ Dr. Graciela Husbands 07/11/13

## 2013-04-29 ENCOUNTER — Encounter: Payer: Self-pay | Admitting: *Deleted

## 2013-04-30 ENCOUNTER — Ambulatory Visit (INDEPENDENT_AMBULATORY_CARE_PROVIDER_SITE_OTHER): Payer: Medicare Other | Admitting: Diagnostic Neuroimaging

## 2013-04-30 ENCOUNTER — Encounter (INDEPENDENT_AMBULATORY_CARE_PROVIDER_SITE_OTHER): Payer: Self-pay

## 2013-04-30 DIAGNOSIS — R269 Unspecified abnormalities of gait and mobility: Secondary | ICD-10-CM

## 2013-04-30 DIAGNOSIS — M6281 Muscle weakness (generalized): Secondary | ICD-10-CM

## 2013-04-30 DIAGNOSIS — E1140 Type 2 diabetes mellitus with diabetic neuropathy, unspecified: Secondary | ICD-10-CM

## 2013-05-07 ENCOUNTER — Encounter: Payer: Self-pay | Admitting: Internal Medicine

## 2013-05-09 NOTE — Procedures (Signed)
   [facility]  NCS (NERVE CONDUCTION STUDY) WITH EMG (ELECTROMYOGRAPHY) REPORT   STUDY DATE: 04/30/13 PATIENT NAME: Jared Landry DOB: Aug 15, 1940 MRN: [id number]  ORDERING CLINICIAN: Joycelyn Schmid, MD   TECHNOLOGIST: Gearldine Shown ELECTROMYOGRAPHER: Glenford Bayley. Lyrical Sowle, MD  CLINICAL INFORMATION: 72 year old male with gait and balance difficulty.  FINDINGS: NERVE CONDUCTION STUDY: Right median and right ulnar motor responses have normal distal latencies, amplitudes, conduction velocities and F-wave latencies. Bilateral peroneal motor responses have normal distal latencies, decreased amplitudes, normal conduction velocities. Bilateral tibial motor responses could not be obtained.  Right median sensory response is normal. Right ulnar sensory response has normal amplitude and slow conduction velocity. Bilateral peroneal sensory responses could not be obtained. Study was technically limited duty significant lower extremity with edema.  NEEDLE ELECTROMYOGRAPHY: Needle examination of selected muscles of the right lower extremity (vastus medialis, tibialis anterior, gastric images) shows no abnormal spontaneous activity at rest and normal motor unit recruitment on exertion. Further needle exam testing was deferred as patient is on Coumadin.  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   INTERPRETING PHYSICIAN:  Suanne Marker, MD Certified in Neurology, Neurophysiology and Neuroimaging  [facility] [address], [address] [place], [place] [address] [phone]

## 2013-05-15 ENCOUNTER — Telehealth: Payer: Self-pay | Admitting: Cardiology

## 2013-05-15 NOTE — Telephone Encounter (Signed)
Pt wife states he has been ill all day. Cant void, sob and has diarrhea. Wife feels she can not get him into the car and will call the ambulance. I advised to call/ she agreed to plan.

## 2013-05-15 NOTE — Telephone Encounter (Signed)
New Problem  Pt is having a lot of trouble breathing,diarrhea urgency to urinate but nothing is coming out//pt states he is very weak thinking about calling the ambulance.

## 2013-05-18 NOTE — Telephone Encounter (Signed)
Agree with previous plan to seek attention at ER.

## 2013-05-29 ENCOUNTER — Telehealth: Payer: Self-pay | Admitting: Pharmacist

## 2013-05-29 NOTE — Telephone Encounter (Signed)
Patient previously had protimes managed by [facility], and he is overdue for protime.  Patient called today, and he stated he would try to come in over the next week, but he didn't know what day it would be.  He agrees to call up here prior to coming so we can make an appointment for him.  He states he understands importance of keeping this medication monitored.

## 2013-06-09 ENCOUNTER — Other Ambulatory Visit: Payer: Self-pay | Admitting: Cardiology

## 2013-06-09 ENCOUNTER — Telehealth: Payer: Self-pay | Admitting: Pharmacist

## 2013-06-09 NOTE — Telephone Encounter (Signed)
Patient's last INR was 3.8 on 04/01/13 and he has missed f/u appointments.  He was suppose to call and make an appointment recently, but never did so.  He needs refills on warfarin.  He tells me he can't come in until 06/23/13 for protime.  He is on warfarin 2.5 mg / 1.25 mg alternating every other day.  Uses 5 mg pills of warfarin.  I sent in a 1 month supply of warfarin with no refills.  I told him we wouldn't send in anymore refills until he kept his appointment in 2 weeks.  Patient agreeable to plan.

## 2013-06-23 ENCOUNTER — Ambulatory Visit (INDEPENDENT_AMBULATORY_CARE_PROVIDER_SITE_OTHER): Payer: Medicare Other | Admitting: *Deleted

## 2013-06-23 DIAGNOSIS — I4891 Unspecified atrial fibrillation: Secondary | ICD-10-CM

## 2013-06-23 DIAGNOSIS — Z7901 Long term (current) use of anticoagulants: Secondary | ICD-10-CM | POA: Insufficient documentation

## 2013-06-23 MED ORDER — WARFARIN SODIUM 5 MG PO TABS: ORAL_TABLET | ORAL | Status: DC

## 2013-07-04 ENCOUNTER — Inpatient Hospital Stay (HOSPITAL_COMMUNITY)
Admission: EM | Admit: 2013-07-04 | Discharge: 2013-07-08 | DRG: 690 | Disposition: A | Discharge status: Skilled Nursing Facility | Payer: Medicare Other | Attending: Internal Medicine | Admitting: Internal Medicine

## 2013-07-04 ENCOUNTER — Emergency Department (HOSPITAL_COMMUNITY): Payer: Medicare Other

## 2013-07-04 ENCOUNTER — Encounter (HOSPITAL_COMMUNITY): Payer: Self-pay | Admitting: Emergency Medicine

## 2013-07-04 DIAGNOSIS — Z87891 Personal history of nicotine dependence: Secondary | ICD-10-CM

## 2013-07-04 DIAGNOSIS — N189 Chronic kidney disease, unspecified: Secondary | ICD-10-CM | POA: Diagnosis present

## 2013-07-04 DIAGNOSIS — N179 Acute kidney failure, unspecified: Secondary | ICD-10-CM | POA: Diagnosis present

## 2013-07-04 DIAGNOSIS — N390 Urinary tract infection, site not specified: Principal | ICD-10-CM | POA: Diagnosis present

## 2013-07-04 DIAGNOSIS — I2510 Atherosclerotic heart disease of native coronary artery without angina pectoris: Secondary | ICD-10-CM | POA: Diagnosis present

## 2013-07-04 DIAGNOSIS — N138 Other obstructive and reflux uropathy: Secondary | ICD-10-CM | POA: Diagnosis present

## 2013-07-04 DIAGNOSIS — I5022 Chronic systolic (congestive) heart failure: Secondary | ICD-10-CM | POA: Diagnosis present

## 2013-07-04 DIAGNOSIS — D649 Anemia, unspecified: Secondary | ICD-10-CM | POA: Diagnosis present

## 2013-07-04 DIAGNOSIS — I447 Left bundle-branch block, unspecified: Secondary | ICD-10-CM | POA: Diagnosis present

## 2013-07-04 DIAGNOSIS — R310 Gross hematuria: Secondary | ICD-10-CM | POA: Diagnosis present

## 2013-07-04 DIAGNOSIS — I129 Hypertensive chronic kidney disease with stage 1 through stage 4 chronic kidney disease, or unspecified chronic kidney disease: Secondary | ICD-10-CM | POA: Diagnosis present

## 2013-07-04 DIAGNOSIS — I2589 Other forms of chronic ischemic heart disease: Secondary | ICD-10-CM | POA: Diagnosis present

## 2013-07-04 DIAGNOSIS — N401 Enlarged prostate with lower urinary tract symptoms: Secondary | ICD-10-CM | POA: Diagnosis present

## 2013-07-04 DIAGNOSIS — G9340 Encephalopathy, unspecified: Secondary | ICD-10-CM

## 2013-07-04 DIAGNOSIS — Z7982 Long term (current) use of aspirin: Secondary | ICD-10-CM

## 2013-07-04 DIAGNOSIS — N200 Calculus of kidney: Secondary | ICD-10-CM | POA: Diagnosis present

## 2013-07-04 DIAGNOSIS — G589 Mononeuropathy, unspecified: Secondary | ICD-10-CM | POA: Diagnosis present

## 2013-07-04 DIAGNOSIS — Q619 Cystic kidney disease, unspecified: Secondary | ICD-10-CM

## 2013-07-04 DIAGNOSIS — I509 Heart failure, unspecified: Secondary | ICD-10-CM | POA: Diagnosis present

## 2013-07-04 DIAGNOSIS — Z95810 Presence of automatic (implantable) cardiac defibrillator: Secondary | ICD-10-CM

## 2013-07-04 DIAGNOSIS — I471 Supraventricular tachycardia: Secondary | ICD-10-CM

## 2013-07-04 DIAGNOSIS — R319 Hematuria, unspecified: Secondary | ICD-10-CM | POA: Diagnosis present

## 2013-07-04 DIAGNOSIS — N289 Disorder of kidney and ureter, unspecified: Secondary | ICD-10-CM

## 2013-07-04 DIAGNOSIS — R350 Frequency of micturition: Secondary | ICD-10-CM | POA: Diagnosis present

## 2013-07-04 DIAGNOSIS — E039 Hypothyroidism, unspecified: Secondary | ICD-10-CM | POA: Diagnosis present

## 2013-07-04 DIAGNOSIS — R509 Fever, unspecified: Secondary | ICD-10-CM

## 2013-07-04 DIAGNOSIS — R5381 Other malaise: Secondary | ICD-10-CM | POA: Diagnosis not present

## 2013-07-04 DIAGNOSIS — F17200 Nicotine dependence, unspecified, uncomplicated: Secondary | ICD-10-CM | POA: Diagnosis present

## 2013-07-04 DIAGNOSIS — R351 Nocturia: Secondary | ICD-10-CM | POA: Diagnosis present

## 2013-07-04 DIAGNOSIS — E119 Type 2 diabetes mellitus without complications: Secondary | ICD-10-CM | POA: Diagnosis present

## 2013-07-04 DIAGNOSIS — Z79899 Other long term (current) drug therapy: Secondary | ICD-10-CM

## 2013-07-04 DIAGNOSIS — I255 Ischemic cardiomyopathy: Secondary | ICD-10-CM

## 2013-07-04 DIAGNOSIS — I472 Ventricular tachycardia: Secondary | ICD-10-CM

## 2013-07-04 DIAGNOSIS — J4489 Other specified chronic obstructive pulmonary disease: Secondary | ICD-10-CM | POA: Diagnosis present

## 2013-07-04 DIAGNOSIS — M109 Gout, unspecified: Secondary | ICD-10-CM | POA: Diagnosis present

## 2013-07-04 DIAGNOSIS — M1990 Unspecified osteoarthritis, unspecified site: Secondary | ICD-10-CM | POA: Diagnosis present

## 2013-07-04 DIAGNOSIS — I4891 Unspecified atrial fibrillation: Secondary | ICD-10-CM | POA: Diagnosis present

## 2013-07-04 DIAGNOSIS — J449 Chronic obstructive pulmonary disease, unspecified: Secondary | ICD-10-CM | POA: Diagnosis present

## 2013-07-04 DIAGNOSIS — Z7901 Long term (current) use of anticoagulants: Secondary | ICD-10-CM

## 2013-07-04 LAB — POCT I-STAT, CHEM 8
BUN: 32 mg/dL — ABNORMAL HIGH (ref 6–23)
Calcium, Ion: 1.16 mmol/L (ref 1.13–1.30)
Chloride: 102 mEq/L (ref 96–112)
Creatinine, Ser: 2.1 mg/dL — ABNORMAL HIGH (ref 0.50–1.35)
Glucose, Bld: 100 mg/dL — ABNORMAL HIGH (ref 70–99)
HCT: 36 % — ABNORMAL LOW (ref 39.0–52.0)
Potassium: 3.5 mEq/L (ref 3.5–5.1)
Sodium: 140 mEq/L (ref 135–145)

## 2013-07-04 MED ORDER — SODIUM CHLORIDE 0.9 % IV SOLN
INTRAVENOUS | Status: DC
  Administered 2013-07-04 – 2013-07-06 (×6): via INTRAVENOUS

## 2013-07-04 MED ORDER — ACETAMINOPHEN 325 MG PO TABS
650.0000 mg | ORAL_TABLET | Freq: Once | ORAL | Status: AC
  Administered 2013-07-05: 650 mg via ORAL
  Filled 2013-07-04: qty 2

## 2013-07-04 NOTE — ED Notes (Signed)
From home: wife stated," fever, confused." According to EMS: GCS 15.  Pt. Stated, "weird dreams."  Temp at home: 63 F. Took x 1 81 mg asa.  "foul smelling urine in house." finished an antibiotic for UTI x 1 mos. Ago. When standing, left leg hurting x few days. Pain similar to gout.

## 2013-07-05 ENCOUNTER — Encounter (HOSPITAL_COMMUNITY): Payer: Self-pay | Admitting: Internal Medicine

## 2013-07-05 ENCOUNTER — Observation Stay (HOSPITAL_COMMUNITY): Payer: Medicare Other

## 2013-07-05 ENCOUNTER — Emergency Department (HOSPITAL_COMMUNITY): Payer: Medicare Other

## 2013-07-05 DIAGNOSIS — I4891 Unspecified atrial fibrillation: Secondary | ICD-10-CM

## 2013-07-05 DIAGNOSIS — M109 Gout, unspecified: Secondary | ICD-10-CM | POA: Diagnosis present

## 2013-07-05 DIAGNOSIS — R509 Fever, unspecified: Secondary | ICD-10-CM

## 2013-07-05 DIAGNOSIS — R319 Hematuria, unspecified: Secondary | ICD-10-CM

## 2013-07-05 DIAGNOSIS — I2589 Other forms of chronic ischemic heart disease: Secondary | ICD-10-CM

## 2013-07-05 DIAGNOSIS — Z7901 Long term (current) use of anticoagulants: Secondary | ICD-10-CM

## 2013-07-05 LAB — COMPREHENSIVE METABOLIC PANEL
ALT: 24 U/L (ref 0–53)
AST: 44 U/L — ABNORMAL HIGH (ref 0–37)
Albumin: 3.1 g/dL — ABNORMAL LOW (ref 3.5–5.2)
Alkaline Phosphatase: 183 U/L — ABNORMAL HIGH (ref 39–117)
Creatinine, Ser: 1.74 mg/dL — ABNORMAL HIGH (ref 0.50–1.35)
GFR calc non Af Amer: 37 mL/min — ABNORMAL LOW (ref >90)
Glucose, Bld: 100 mg/dL — ABNORMAL HIGH (ref 70–99)
Potassium: 3.5 mEq/L (ref 3.5–5.1)
Sodium: 137 mEq/L (ref 135–145)
Total Protein: 6.5 g/dL (ref 6.0–8.3)

## 2013-07-05 LAB — URINE MICROSCOPIC-ADD ON

## 2013-07-05 LAB — URINALYSIS, ROUTINE W REFLEX MICROSCOPIC
Glucose, UA: NEGATIVE mg/dL
Ketones, ur: NEGATIVE mg/dL
pH: 5 (ref 5.0–8.0)

## 2013-07-05 LAB — GLUCOSE, CAPILLARY
Glucose-Capillary: 95 mg/dL (ref 70–99)
Glucose-Capillary: 103 mg/dL — ABNORMAL HIGH (ref 70–99)

## 2013-07-05 LAB — PROTIME-INR
INR: 1.69 — ABNORMAL HIGH (ref 0.00–1.49)
Prothrombin Time: 19.4 seconds — ABNORMAL HIGH (ref 11.6–15.2)

## 2013-07-05 LAB — CBC
Hemoglobin: 10.5 g/dL — ABNORMAL LOW (ref 13.0–17.0)
MCHC: 35.7 g/dL (ref 30.0–36.0)
MCV: 95.1 fL (ref 78.0–100.0)
Platelets: 203 10*3/uL (ref 150–400)
RDW: 16.5 % — ABNORMAL HIGH (ref 11.5–15.5)

## 2013-07-05 MED ORDER — ZINC SULFATE 220 (50 ZN) MG PO CAPS
220.0000 mg | ORAL_CAPSULE | Freq: Every day | ORAL | Status: DC
  Administered 2013-07-05 – 2013-07-08 (×4): 220 mg via ORAL
  Filled 2013-07-05 (×4): qty 1

## 2013-07-05 MED ORDER — WARFARIN - PHARMACIST DOSING INPATIENT
Freq: Every day | Status: DC
  Administered 2013-07-06: 18:00:00

## 2013-07-05 MED ORDER — MAGNESIUM 300 MG PO CAPS: 1.0000 | ORAL_CAPSULE | ORAL | Status: DC

## 2013-07-05 MED ORDER — DEXTROSE 5 % IV SOLN
1.0000 g | Freq: Once | INTRAVENOUS | Status: AC
  Administered 2013-07-05: 1 g via INTRAVENOUS
  Filled 2013-07-05: qty 10

## 2013-07-05 MED ORDER — DOCUSATE SODIUM 100 MG PO CAPS
100.0000 mg | ORAL_CAPSULE | Freq: Two times a day (BID) | ORAL | Status: DC
  Administered 2013-07-06: 100 mg via ORAL
  Filled 2013-07-05 (×7): qty 1

## 2013-07-05 MED ORDER — AMIODARONE HCL 200 MG PO TABS
200.0000 mg | ORAL_TABLET | Freq: Every day | ORAL | Status: DC
  Administered 2013-07-05 – 2013-07-08 (×4): 200 mg via ORAL
  Filled 2013-07-05 (×4): qty 1

## 2013-07-05 MED ORDER — B COMPLEX-C PO TABS
1.0000 | ORAL_TABLET | Freq: Every day | ORAL | Status: DC
  Administered 2013-07-05 – 2013-07-08 (×4): 1 via ORAL
  Filled 2013-07-05 (×4): qty 1

## 2013-07-05 MED ORDER — CO Q-10 100 MG PO CAPS: 1.0000 | ORAL_CAPSULE | Freq: Every day | ORAL | Status: DC

## 2013-07-05 MED ORDER — MAGNESIUM OXIDE 400 (241.3 MG) MG PO TABS
400.0000 mg | ORAL_TABLET | Freq: Every day | ORAL | Status: DC
  Administered 2013-07-05 – 2013-07-08 (×4): 400 mg via ORAL
  Filled 2013-07-05 (×4): qty 1

## 2013-07-05 MED ORDER — ONDANSETRON HCL 4 MG/2ML IJ SOLN: 4.0000 mg | Freq: Four times a day (QID) | INTRAMUSCULAR | Status: DC | PRN

## 2013-07-05 MED ORDER — ZINC 100 MG PO TABS: 1.0000 | ORAL_TABLET | ORAL | Status: DC

## 2013-07-05 MED ORDER — B COMPLEX PO TABS: 1.0000 | ORAL_TABLET | ORAL | Status: DC

## 2013-07-05 MED ORDER — PANTOPRAZOLE SODIUM 40 MG PO TBEC
40.0000 mg | DELAYED_RELEASE_TABLET | Freq: Every day | ORAL | Status: DC
  Administered 2013-07-05 – 2013-07-08 (×4): 40 mg via ORAL
  Filled 2013-07-05 (×4): qty 1

## 2013-07-05 MED ORDER — ALLOPURINOL 300 MG PO TABS
400.0000 mg | ORAL_TABLET | Freq: Every day | ORAL | Status: DC
  Administered 2013-07-05 – 2013-07-08 (×4): 400 mg via ORAL
  Filled 2013-07-05 (×5): qty 1

## 2013-07-05 MED ORDER — METOPROLOL SUCCINATE ER 100 MG PO TB24
100.0000 mg | ORAL_TABLET | Freq: Every day | ORAL | Status: DC
Start: 2013-07-05 — End: 2013-07-08
  Administered 2013-07-05 – 2013-07-08 (×4): 100 mg via ORAL
  Filled 2013-07-05 (×4): qty 1

## 2013-07-05 MED ORDER — COLCHICINE 0.6 MG PO TABS
0.6000 mg | ORAL_TABLET | Freq: Two times a day (BID) | ORAL | Status: DC
  Administered 2013-07-05 – 2013-07-08 (×7): 0.6 mg via ORAL
  Filled 2013-07-05 (×8): qty 1

## 2013-07-05 MED ORDER — LEVOTHYROXINE SODIUM 25 MCG PO TABS
25.0000 ug | ORAL_TABLET | Freq: Every day | ORAL | Status: DC
  Administered 2013-07-05 – 2013-07-06 (×2): 25 ug via ORAL
  Filled 2013-07-05 (×3): qty 1

## 2013-07-05 MED ORDER — HYDROMORPHONE HCL PF 1 MG/ML IJ SOLN
0.5000 mg | INTRAMUSCULAR | Status: DC | PRN
  Administered 2013-07-05 (×2): 0.5 mg via INTRAVENOUS
  Filled 2013-07-05: qty 1

## 2013-07-05 MED ORDER — WARFARIN SODIUM 5 MG PO TABS
5.0000 mg | ORAL_TABLET | Freq: Once | ORAL | Status: AC
  Administered 2013-07-05: 5 mg via ORAL
  Filled 2013-07-05: qty 1

## 2013-07-05 MED ORDER — PREDNISONE 20 MG PO TABS
20.0000 mg | ORAL_TABLET | Freq: Two times a day (BID) | ORAL | Status: DC
  Administered 2013-07-05 – 2013-07-06 (×4): 20 mg via ORAL
  Filled 2013-07-05 (×7): qty 1

## 2013-07-05 MED ORDER — ATORVASTATIN CALCIUM 10 MG PO TABS
5.0000 mg | ORAL_TABLET | Freq: Every day | ORAL | Status: DC
Start: 2013-07-05 — End: 2013-07-08
  Administered 2013-07-05: 5 mg via ORAL
  Administered 2013-07-06: 13:00:00 via ORAL
  Administered 2013-07-07 – 2013-07-08 (×2): 5 mg via ORAL
  Filled 2013-07-05 (×4): qty 0.5

## 2013-07-05 MED ORDER — ALLOPURINOL 100 MG PO TABS
100.0000 mg | ORAL_TABLET | Freq: Every day | ORAL | Status: DC
  Filled 2013-07-05: qty 1

## 2013-07-05 MED ORDER — ALBUTEROL SULFATE HFA 108 (90 BASE) MCG/ACT IN AERS
2.0000 | INHALATION_SPRAY | Freq: Four times a day (QID) | RESPIRATORY_TRACT | Status: DC | PRN
  Administered 2013-07-05: 2 via RESPIRATORY_TRACT
  Filled 2013-07-05: qty 6.7

## 2013-07-05 MED ORDER — ONDANSETRON HCL 4 MG PO TABS: 4.0000 mg | ORAL_TABLET | Freq: Four times a day (QID) | ORAL | Status: DC | PRN

## 2013-07-05 MED ORDER — INSULIN ASPART 100 UNIT/ML ~~LOC~~ SOLN: 0.0000 [IU] | Freq: Every day | SUBCUTANEOUS | Status: DC

## 2013-07-05 MED ORDER — ASTAXANTHIN 4 MG PO CAPS: 1.0000 | ORAL_CAPSULE | Freq: Every day | ORAL | Status: DC

## 2013-07-05 MED ORDER — INSULIN ASPART 100 UNIT/ML ~~LOC~~ SOLN
0.0000 [IU] | Freq: Three times a day (TID) | SUBCUTANEOUS | Status: DC
  Administered 2013-07-05: 3 [IU] via SUBCUTANEOUS
  Administered 2013-07-06 – 2013-07-07 (×5): 2 [IU] via SUBCUTANEOUS

## 2013-07-05 MED ORDER — MOMETASONE FURO-FORMOTEROL FUM 100-5 MCG/ACT IN AERO
2.0000 | INHALATION_SPRAY | Freq: Two times a day (BID) | RESPIRATORY_TRACT | Status: DC
  Administered 2013-07-05 – 2013-07-07 (×6): 2 via RESPIRATORY_TRACT
  Filled 2013-07-05: qty 8.8

## 2013-07-05 MED ORDER — VITAMIN D3 25 MCG (1000 UNIT) PO TABS
2000.0000 [IU] | ORAL_TABLET | ORAL | Status: DC
  Administered 2013-07-05 – 2013-07-07 (×2): 2000 [IU] via ORAL
  Filled 2013-07-05 (×2): qty 2

## 2013-07-05 MED ORDER — ASPIRIN EC 81 MG PO TBEC
81.0000 mg | DELAYED_RELEASE_TABLET | Freq: Every day | ORAL | Status: DC
  Administered 2013-07-05 – 2013-07-08 (×4): 81 mg via ORAL
  Filled 2013-07-05 (×4): qty 1

## 2013-07-05 NOTE — H&P (Signed)
[facility] History and Physical  KAYLIN SCHELLENBERG XBJ:[id number] DOB: Dec 14, 1940    PCP:   Georgann Housekeeper, MD   Chief Complaint:  Low grade fever, left MTP joint pain, and a little confusion.  HPI: Jared Landry is an 72 y.o. male with hx of afib, CAD s/p DES, DM2, gout on colchicine PRN, regular allopurinol, HTN, ischemic cardiomyopathy, s/p IVCD, presents with acute onset of left MTP joint pain for three days unable to ambulate.  He has low grade fever as well.  He didn't take his colchicine, but has been compliant with his allopurinol.  He also had a little confusion according to his wife, but this has cleared up quickly.  Evaluation in the ER included a UA which showed microscopic hematuria, but no significant WBC nor leukocytes esterase.  His INR was slightly low at 1.7, but Cr went up from 0.8 to 1.7.  His CXR was clear, and WBC was 13K.  He was given some IVF, and felt markedly better.  Blood cultures were obtained and he was given IV Rocephin for ?UTI.  Hospitalist was asked to admit him for further work up.   His wife also imparted me that she cannot take care of him at home if he cannot ambulate.  Rewiew of Systems:  Constitutional: Negative for malaise,  and chills. No significant weight loss or weight gain Eyes: Negative for eye pain, redness and discharge, diplopia, visual changes, or flashes of light. ENMT: Negative for ear pain, hoarseness, nasal congestion, sinus pressure and sore throat. No headaches; tinnitus, drooling, or problem swallowing. Cardiovascular: Negative for chest pain, palpitations, diaphoresis, dyspnea and peripheral edema. ; No orthopnea, PND Respiratory: Negative for cough, hemoptysis, wheezing and stridor. No pleuritic chestpain. Gastrointestinal: Negative for nausea, vomiting, diarrhea, constipation, abdominal pain, melena, blood in stool, hematemesis, jaundice and rectal bleeding.    Genitourinary: Negative for frequency, dysuria, incontinence,flank pain  and hematuria; Musculoskeletal: Negative for back pain and neck pain. Skin: . Negative for pruritus, rash, abrasions, bruising and skin lesion.; ulcerations Neuro: Negative for headache, lightheadedness and neck stiffness. Negative for weakness, altered level of consciousness , altered mental status, extremity weakness, burning feet, involuntary movement, seizure and syncope.  Psych: negative for anxiety, depression, insomnia, tearfulness, panic attacks, hallucinations, paranoia, suicidal or homicidal ideation.   Past Medical History  Diagnosis Date  . Atrial fibrillation     Cardioversion 07/20/11  transiently <3h successful  . CAD (coronary artery disease)     s/p drug eluting stent  . Diabetes mellitus     type II  . Hypertension   . Gout   . Osteoarthritis   . COPD (chronic obstructive pulmonary disease)   . 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 Jude  . Ventricular tachycardia     terminated by the ICD  . Chronic systolic heart failure     Past Surgical History  Procedure Laterality Date  . Appendectomy    . Tonsillectomy    . Vasectomy    . Surgical i &d after brown recluse inner thigh    . Cardiac defibrillator placement      St. Jude   . Cardioversion  07/20/2011    Procedure: CARDIOVERSION;  Surgeon: Donato Schultz, MD;  Location: [facility] OR;  Service: Cardiovascular;  Laterality: N/A;  . Cardioversion  01/18/2012    Procedure: CARDIOVERSION;  Surgeon: Donato Schultz, MD;  Location: [facility] OR;  Service: Cardiovascular;  Laterality: N/A;    Medications:  HOME MEDS: Prior to  Admission medications   Medication Sig Start Date End Date Taking? Authorizing Provider  albuterol (PROVENTIL HFA;VENTOLIN HFA) 108 (90 BASE) MCG/ACT inhaler Inhale 2 puffs into the lungs every 6 (six) hours as needed. For shortness of breath.   Yes Historical Provider, MD  allopurinol (ZYLOPRIM) 100 MG tablet Take 100 mg by mouth daily.   Yes Historical Provider, MD   allopurinol (ZYLOPRIM) 300 MG tablet Take 300 mg by mouth daily.   Yes Historical Provider, MD  aspirin EC 81 MG tablet Take 81 mg by mouth daily.   Yes Historical Provider, MD  Astaxanthin 4 MG CAPS Take 1 capsule by mouth daily.   Yes Historical Provider, MD  atorvastatin (LIPITOR) 10 MG tablet Take 5 mg by mouth daily.    Yes Historical Provider, MD  b complex vitamins tablet Take 1 tablet by mouth every other day. Alternate with Zinc   Yes Historical Provider, MD  cholecalciferol (VITAMIN D) 1000 UNITS tablet Take 2,000 Units by mouth every other day.    Yes Historical Provider, MD  Coenzyme Q10 (CO Q-10) 100 MG CAPS Take 1 tablet by mouth daily. Alternate with Magnesium   Yes Historical Provider, MD  colchicine 0.6 MG tablet Take 0.6 mg by mouth daily as needed (for gout flare ups).    Yes Historical Provider, MD  Fluticasone-Salmeterol (ADVAIR) 100-50 MCG/DOSE AEPB Inhale 1 puff into the lungs 2 (two) times daily as needed.    Yes Historical Provider, MD  furosemide (LASIX) 20 MG tablet Take 20 mg by mouth daily. May take an additional dose if needed for extreme edema   Yes Historical Provider, MD  levothyroxine (SYNTHROID) 25 MCG tablet Take 25 mcg by mouth daily before breakfast.   Yes Historical Provider, MD  Magnesium 300 MG CAPS Take 1 capsule by mouth every other day.    Yes Historical Provider, MD  metoprolol succinate (TOPROL-XL) 100 MG 24 hr tablet Take 100 mg by mouth daily. Take with or immediately following a meal.   Yes Historical Provider, MD  warfarin (COUMADIN) 7.5 MG tablet Take 1.875-3.75 mg by mouth daily. Takes a half tablet daily except Monday and Friday, then takes a quarter tablet   Yes Historical Provider, MD  amiodarone (PACERONE) 200 MG tablet Take 1 tablet (200 mg total) by mouth daily. 07/20/11 07/19/12  Donato Schultz, MD  Zinc 100 MG TABS Take 1 tablet by mouth every other day. Alternate with B Complex    Historical Provider, MD     Allergies:  Allergies   Allergen Reactions  . Gabapentin Other (See Comments)    May cause tremors and shaking    Social History:   reports that he quit smoking about 2 years ago. His smoking use included Cigarettes. He smoked 0.30 packs per day. He does not have any smokeless tobacco history on file. He reports that he drinks about 16.8 ounces of alcohol per week. He reports that he does not use illicit drugs.  Family History: Family History  Problem Relation Age of Onset  . Heart failure Mother   . Diabetes type II Sister   . Appendicitis Father     rupture     Physical Exam: Filed Vitals:   07/04/13 2330 07/04/13 2336 07/05/13 0215 07/05/13 0221  BP: 116/54   97/52  Pulse: 80  74   Temp:  100.5 F (38.1 C)  98.6 F (37 C)  TempSrc: Oral Rectal  Oral  Resp: 17  15   SpO2: 99%  93%  Blood pressure 97/52, pulse 74, temperature 98.6 F (37 C), temperature source Oral, resp. rate 15, SpO2 93.00%.  GEN:  Pleasant  patient lying in the stretcher in no acute distress; cooperative with exam. PSYCH:  alert and oriented x4; does not appear anxious or depressed; affect is appropriate. HEENT: Mucous membranes pink and anicteric; PERRLA; EOM intact; no cervical lymphadenopathy nor thyromegaly or carotid bruit; no JVD; There were no stridor. Neck is very supple. Breasts:: Not examined CHEST WALL: No tenderness, pacer on the left upper chest. CHEST: Normal respiration, clear to auscultation bilaterally.  HEART: Regular rate and rhythm.  There are no murmur, rub, or gallops.   BACK: No kyphosis or scoliosis; no CVA tenderness ABDOMEN: soft and non-tender; no masses, no organomegaly, normal abdominal bowel sounds; no pannus; no intertriginous candida. There is no rebound and no distention. Rectal Exam: Not done EXTREMITIES: No bone or joint deformity; age-appropriate arthropathy of the hands and knees; he has edema on both legs, with MTP redness and tenderness. no ulcerations.  There is no calf  tenderness. Genitalia: not examined PULSES: 2+ and symmetric SKIN: Normal hydration no rash or ulceration CNS: Cranial nerves 2-12 grossly intact no focal lateralizing neurologic deficit.  Speech is fluent; uvula elevated with phonation, facial symmetry and tongue midline. DTR are normal bilaterally, cerebella exam is intact, barbinski is negative and strengths are equaled bilaterally.  No sensory loss.   Labs on Admission:  Basic Metabolic Panel:  Recent Labs Lab 07/04/13 2334 07/04/13 2343  NA 137 140  K 3.5 3.5  CL 98 102  CO2 25  --   GLUCOSE 100* 100*  BUN 34* 32*  CREATININE 1.74* 2.10*  CALCIUM 9.0  --    Liver Function Tests:  Recent Labs Lab 07/04/13 2334  AST 44*  ALT 24  ALKPHOS 183*  BILITOT 2.0*  PROT 6.5  ALBUMIN 3.1*   No results found for this basename: LIPASE, AMYLASE,  in the last 168 hours No results found for this basename: AMMONIA,  in the last 168 hours CBC:  Recent Labs Lab 07/04/13 2334 07/04/13 2343  WBC 13.5*  --   HGB 10.5* 12.2*  HCT 29.4* 36.0*  MCV 95.1  --   PLT 203  --    Cardiac Enzymes: No results found for this basename: CKTOTAL, CKMB, CKMBINDEX, TROPONINI,  in the last 168 hours  CBG: No results found for this basename: GLUCAP,  in the last 168 hours   Radiological Exams on Admission: Dg Chest 1 View  07/05/2013   CLINICAL DATA:  Fever.  EXAM: CHEST - 1 VIEW  COMPARISON:  For 09/2009  FINDINGS: Stable appearance of cardiac pacemaker. Cardiac enlargement with suggestion of mild pulmonary vascular congestion. Small bilateral pleural effusions. No evidence of edema or consolidation. No pneumothorax.  IMPRESSION: Cardiac enlargement with pulmonary vascular congestion and small bilateral pleural effusions.   Electronically Signed   By: Burman Nieves M.D.   On: 07/05/2013 00:25   Ct Head Wo Contrast  07/04/2013   CLINICAL DATA:  Fever and confusion.  Weird dreams.  Recent UTI.  EXAM: CT HEAD WITHOUT CONTRAST  TECHNIQUE:  Contiguous axial images were obtained from the base of the skull through the vertex without intravenous contrast.  COMPARISON:  12/31/2012  FINDINGS: Diffuse cerebral atrophy. No ventricular dilatation. Patchy low-attenuation changes in the deep white matter consistent with small vessel ischemia. No significant change since previous study. No mass effect or midline shift. No abnormal extra-axial fluid collections. Gray-white matter junctions  are distinct. Basal cisterns are not effaced. No evidence of acute intracranial hemorrhage. No depressed skull fractures. Visualized paranasal sinuses and mastoid air cells are not opacified. Vascular calcifications.  IMPRESSION: No acute intracranial abnormalities. Chronic atrophy and small vessel ischemic changes.   Electronically Signed   By: Burman Nieves M.D.   On: 07/04/2013 23:57   Dg Foot Complete Left  07/05/2013   CLINICAL DATA:  Fever.  EXAM: LEFT FOOT - COMPLETE 3+ VIEW  COMPARISON:  None.  FINDINGS: Periarticular erosive changes in the distal left 1st and 5th metatarsal heads and in the left 1st proximal phalanx. Changes could represent degenerative cysts or erosive arthritis such as gout. Dorsal soft tissue swelling. Vascular calcifications. No evidence of acute fracture or subluxation.  IMPRESSION: Periarticular erosive changes in the left 1st and 5th metatarsal heads which could represent degenerative change versus erosive arthritis due to gout. Dorsal soft tissue swelling.   Electronically Signed   By: Burman Nieves M.D.   On: 07/05/2013 00:27   Assessment/Plan Present on Admission:  . Hematuria . DIABETES MELLITUS, TYPE II . Atrial fibrillation . SYSTOLIC HEART FAILURE, CHRONIC . TOBACCO USER . COPD . Biventricular implantable cardioverter-defibrillator in situ . Acute gouty arthritis . LBBB  PLAN:  I suspect his fever was from the acute gout he has on his left MTP joint.  He has more of a microscopic hematuria rather than evidence of a  UTI.  He was given IV Rocephin, and I will continue it pending urine culture.  I am concerned about further work up of the hematuria, given his age, and significant tobacco use in the past (bladder cancer).  Given his co-morbidities and age, I will defer to his PCP concerning any further work up on this, but I think its reasonable to get a CT of the abdomen without contrast to exclude any kidney tumor causing hematuria ( I have ordered this).  He also has increased Cr, and is getting a small amount of IVF.  I will reduce Zyloprim to 100mg  per day.  For his gout, will give low dose prednisone with PPI.  If hyperglycemia, will use SSI to cover them.  I will continue his anticoagulation for his afib and continue all his home medication otherwise.  Please note that [facility] and urine cultures have been ordered.  Lastly, will get OT/PT to help with his gait, but will defer social service consult to his PCP as his wife said he may need STR if he cannot walk.  He is otherwise stable, full code, and will be admitted to Dr Paulita Fujita service as per prior arrangments.  Thank you so much for having me participate in the care of your patient.  Other plans as per orders.  Code Status: FULL Unk Lightning, MD. [facility] Pager [phone] 7pm to 7am.  07/05/2013, 4:43 AM

## 2013-07-05 NOTE — Progress Notes (Signed)
PHARMACIST - PHYSICIAN ORDER COMMUNICATION  CONCERNING: P&T Medication Policy on Herbal Medications  DESCRIPTION:  This patient's orders for:  Astaxanthin and CoEnyzme Q have been noted.  This products are classified as "herbal" or natural products. Due to a lack of definitive safety studies or FDA approval, nonstandard manufacturing practices, plus the potential risk of unknown drug-drug interactions while on inpatient medications, the Pharmacy and Therapeutics Committee does not permit the use of "herbal" or natural products of this type within [facility].   ACTION TAKEN: The pharmacy department is unable to verify this order at this time and your patient has been informed of this safety policy. Please reevaluate patient's clinical condition at discharge and address if the herbal or natural product(s) should be resumed at that time.

## 2013-07-05 NOTE — ED Notes (Signed)
Dr. Dierdre Highman updated on UA

## 2013-07-05 NOTE — Progress Notes (Signed)
ANTICOAGULATION CONSULT NOTE - Initial Consult  Pharmacy Consult for Coumadin Indication: atrial fibrillation  Allergies  Allergen Reactions  . Gabapentin Other (See Comments)    May cause tremors and shaking    Patient Measurements: Weight: 193 lb 9 oz (87.8 kg)  Vital Signs: Temp: 98 F (36.7 C) (11/29 0602) Temp src: Oral (11/29 0602) BP: 113/59 mmHg (11/29 0602) Pulse Rate: 78 (11/29 0602)  Labs:  Recent Labs  07/04/13 2334 07/04/13 2343  HGB 10.5* 12.2*  HCT 29.4* 36.0*  PLT 203  --   LABPROT 19.4*  --   INR 1.69*  --   CREATININE 1.74* 2.10*    The CrCl is unknown because both a height and weight (above a minimum accepted value) are required for this calculation.   Medical History: Past Medical History  Diagnosis Date  . Atrial fibrillation     Cardioversion 07/20/11  transiently <3h successful  . CAD (coronary artery disease)     s/p drug eluting stent  . Diabetes mellitus     type II  . Hypertension   . Gout   . Osteoarthritis   . COPD (chronic obstructive pulmonary disease)   . 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 Jude  . Ventricular tachycardia     terminated by the ICD  . Chronic systolic heart failure     Medications:  Prescriptions prior to admission  Medication Sig Dispense Refill  . albuterol (PROVENTIL HFA;VENTOLIN HFA) 108 (90 BASE) MCG/ACT inhaler Inhale 2 puffs into the lungs every 6 (six) hours as needed. For shortness of breath.      . allopurinol (ZYLOPRIM) 100 MG tablet Take 100 mg by mouth daily.      Marland Kitchen allopurinol (ZYLOPRIM) 300 MG tablet Take 300 mg by mouth daily.      Marland Kitchen aspirin EC 81 MG tablet Take 81 mg by mouth daily.      . Astaxanthin 4 MG CAPS Take 1 capsule by mouth daily.      Marland Kitchen atorvastatin (LIPITOR) 10 MG tablet Take 5 mg by mouth daily.       Marland Kitchen b complex vitamins tablet Take 1 tablet by mouth every other day. Alternate with Zinc      . cholecalciferol (VITAMIN D) 1000 UNITS  tablet Take 2,000 Units by mouth every other day.       . Coenzyme Q10 (CO Q-10) 100 MG CAPS Take 1 tablet by mouth daily. Alternate with Magnesium      . colchicine 0.6 MG tablet Take 0.6 mg by mouth daily as needed (for gout flare ups).       . Fluticasone-Salmeterol (ADVAIR) 100-50 MCG/DOSE AEPB Inhale 1 puff into the lungs 2 (two) times daily as needed.       . furosemide (LASIX) 20 MG tablet Take 20 mg by mouth daily. May take an additional dose if needed for extreme edema      . levothyroxine (SYNTHROID) 25 MCG tablet Take 25 mcg by mouth daily before breakfast.      . Magnesium 300 MG CAPS Take 1 capsule by mouth every other day.       . metoprolol succinate (TOPROL-XL) 100 MG 24 hr tablet Take 100 mg by mouth daily. Take with or immediately following a meal.      . warfarin (COUMADIN) 7.5 MG tablet Take 1.875-3.75 mg by mouth daily. Takes a half tablet daily except Monday and Friday, then takes a quarter tablet      .  amiodarone (PACERONE) 200 MG tablet Take 1 tablet (200 mg total) by mouth daily.  30 tablet  12  . Zinc 100 MG TABS Take 1 tablet by mouth every other day. Alternate with B Complex        Assessment: 72 yo male admitted with AMS/fever, h/o Afib, to continue Coumadin   Goal of Therapy:  INR 2-3 Monitor platelets by anticoagulation protocol: Yes   Plan:  Coumadin 5 mg today Daily INR  Velna Hedgecock, Gary Fleet 07/05/2013,6:07 AM

## 2013-07-05 NOTE — ED Provider Notes (Signed)
CSN: [id number]     Arrival date & time 07/04/13  2322 History   First MD Initiated Contact with Patient 07/04/13 2326     Chief Complaint  Patient presents with  . Fever   (Consider location/radiation/quality/duration/timing/severity/associated sxs/prior Treatment) HPI History provided by patient, EMS and family bedside. Fever with altered mental status tonight. Patient complaining of weird drains with reported confusion at home. Has history of UTI about a month ago and recently has had some strong smelling urine. Patient has history of DM, COPD, CHF and heart disease. He takes Coumadin for atrial fibrillation and has an ICD.  He has chronic swelling in his lower extremities unchanged. He denies any cough or chest pain. He has baseline dyspnea that he feels is unchanged. No abdominal pain, nausea vomiting or diarrhea. He does complain of some left foot discomfort and left knee discomfort, history of gout. Pain is mostly in his left great toe. Symptoms moderate in severity. He should feel like his altered mental status and confusion are improving. Onset tonight at home a few hours prior to arrival.  Past Medical History  Diagnosis Date  . Atrial fibrillation     Cardioversion 07/20/11  transiently <3h successful  . CAD (coronary artery disease)     s/p drug eluting stent  . Diabetes mellitus     type II  . Hypertension   . Gout   . Osteoarthritis   . COPD (chronic obstructive pulmonary disease)   . 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 Jude  . Ventricular tachycardia     terminated by the ICD  . Chronic systolic heart failure    Past Surgical History  Procedure Laterality Date  . Appendectomy    . Tonsillectomy    . Vasectomy    . Surgical i &d after brown recluse inner thigh    . Cardiac defibrillator placement      St. Jude   . Cardioversion  07/20/2011    Procedure: CARDIOVERSION;  Surgeon: Donato Schultz, MD;  Location: [facility] OR;  Service:  Cardiovascular;  Laterality: N/A;  . Cardioversion  01/18/2012    Procedure: CARDIOVERSION;  Surgeon: Donato Schultz, MD;  Location: [facility] OR;  Service: Cardiovascular;  Laterality: N/A;   Family History  Problem Relation Age of Onset  . Heart failure Mother   . Diabetes type II Sister   . Appendicitis Father     rupture   History  Substance Use Topics  . Smoking status: Former Smoker -- 0.30 packs/day    Types: Cigarettes    Quit date: 10/29/2010  . Smokeless tobacco: Not on file  . Alcohol Use: 16.8 oz/week    28 Shots of liquor per week    Review of Systems  Constitutional: Positive for fever.  Eyes: Negative for visual disturbance.  Respiratory: Positive for shortness of breath.   Cardiovascular: Positive for leg swelling. Negative for chest pain.  Gastrointestinal: Negative for vomiting and abdominal pain.  Genitourinary: Positive for frequency. Negative for flank pain.  Musculoskeletal: Negative for back pain, neck pain and neck stiffness.  Skin: Negative for rash.  Neurological: Negative for headaches.  All other systems reviewed and are negative.    Allergies  Gabapentin  Home Medications   Current Outpatient Rx  Name  Route  Sig  Dispense  Refill  . albuterol (PROVENTIL HFA;VENTOLIN HFA) 108 (90 BASE) MCG/ACT inhaler   Inhalation   Inhale 2 puffs into the lungs every 6 (six) hours as needed. For  shortness of breath.         . allopurinol (ZYLOPRIM) 100 MG tablet   Oral   Take 100 mg by mouth daily.         Marland Kitchen allopurinol (ZYLOPRIM) 300 MG tablet   Oral   Take 300 mg by mouth daily.         Marland Kitchen aspirin EC 81 MG tablet   Oral   Take 81 mg by mouth daily.         . Astaxanthin 4 MG CAPS   Oral   Take 1 capsule by mouth daily.         Marland Kitchen atorvastatin (LIPITOR) 10 MG tablet   Oral   Take 5 mg by mouth daily.          Marland Kitchen b complex vitamins tablet   Oral   Take 1 tablet by mouth every other day. Alternate with Zinc         . cholecalciferol  (VITAMIN D) 1000 UNITS tablet   Oral   Take 2,000 Units by mouth every other day.          . Coenzyme Q10 (CO Q-10) 100 MG CAPS   Oral   Take 1 tablet by mouth daily. Alternate with Magnesium         . colchicine 0.6 MG tablet   Oral   Take 0.6 mg by mouth daily as needed (for gout flare ups).          . Fluticasone-Salmeterol (ADVAIR) 100-50 MCG/DOSE AEPB   Inhalation   Inhale 1 puff into the lungs 2 (two) times daily as needed.          . furosemide (LASIX) 20 MG tablet   Oral   Take 20 mg by mouth daily. May take an additional dose if needed for extreme edema         . levothyroxine (SYNTHROID) 25 MCG tablet   Oral   Take 25 mcg by mouth daily before breakfast.         . Magnesium 300 MG CAPS   Oral   Take 1 capsule by mouth every other day.          . metoprolol succinate (TOPROL-XL) 100 MG 24 hr tablet   Oral   Take 100 mg by mouth daily. Take with or immediately following a meal.         . warfarin (COUMADIN) 7.5 MG tablet   Oral   Take 1.875-3.75 mg by mouth daily. Takes a half tablet daily except Monday and Friday, then takes a quarter tablet         . EXPIRED: amiodarone (PACERONE) 200 MG tablet   Oral   Take 1 tablet (200 mg total) by mouth daily.   30 tablet   12   . Zinc 100 MG TABS   Oral   Take 1 tablet by mouth every other day. Alternate with B Complex          BP 113/58  Pulse 83  Temp(Src) 100.5 F (38.1 C) (Rectal)  Resp 18  SpO2 99% Physical Exam  Constitutional: He appears well-developed and well-nourished.  HENT:  Head: Normocephalic and atraumatic.  Eyes: EOM are normal. Pupils are equal, round, and reactive to light.  Neck: Neck supple.  Cardiovascular: Normal rate, regular rhythm and intact distal pulses.   Pulmonary/Chest: Effort normal and breath sounds normal. No respiratory distress.  Abdominal: Soft. Bowel sounds are normal. He exhibits no distension. There is no tenderness.  Musculoskeletal: Normal range  of motion.  Bilateral lower extremity edema with some increased warmth to touch overr left great toe. There are 2 small superficial diabetic ulcers to left toes. No calf tenderness. Equal pulses.  Neurological: Coordination normal.  Alert and oriented. Speech clear. No unilateral deficits.  Skin: Skin is warm and dry.    ED Course  Procedures (including critical care time) Labs Review Labs Reviewed  PROTIME-INR - Abnormal; Notable for the following:    Prothrombin Time 19.4 (*)    INR 1.69 (*)    All other components within normal limits  CBC - Abnormal; Notable for the following:    WBC 13.5 (*)    RBC 3.09 (*)    Hemoglobin 10.5 (*)    HCT 29.4 (*)    RDW 16.5 (*)    All other components within normal limits  COMPREHENSIVE METABOLIC PANEL - Abnormal; Notable for the following:    Glucose, Bld 100 (*)    BUN 34 (*)    Creatinine, Ser 1.74 (*)    Albumin 3.1 (*)    AST 44 (*)    Alkaline Phosphatase 183 (*)    Total Bilirubin 2.0 (*)    GFR calc non Af Amer 37 (*)    GFR calc Af Amer 43 (*)    All other components within normal limits  POCT I-STAT, CHEM 8 - Abnormal; Notable for the following:    BUN 32 (*)    Creatinine, Ser 2.10 (*)    Glucose, Bld 100 (*)    Hemoglobin 12.2 (*)    HCT 36.0 (*)    All other components within normal limits  URINALYSIS, ROUTINE W REFLEX MICROSCOPIC  CG4 I-STAT (LACTIC ACID)   Imaging Review Dg Chest 1 View  07/05/2013   CLINICAL DATA:  Fever.  EXAM: CHEST - 1 VIEW  COMPARISON:  For 09/2009  FINDINGS: Stable appearance of cardiac pacemaker. Cardiac enlargement with suggestion of mild pulmonary vascular congestion. Small bilateral pleural effusions. No evidence of edema or consolidation. No pneumothorax.  IMPRESSION: Cardiac enlargement with pulmonary vascular congestion and small bilateral pleural effusions.   Electronically Signed   By: Burman Nieves M.D.   On: 07/05/2013 00:25   Ct Head Wo Contrast  07/04/2013   CLINICAL DATA:   Fever and confusion.  Weird dreams.  Recent UTI.  EXAM: CT HEAD WITHOUT CONTRAST  TECHNIQUE: Contiguous axial images were obtained from the base of the skull through the vertex without intravenous contrast.  COMPARISON:  12/31/2012  FINDINGS: Diffuse cerebral atrophy. No ventricular dilatation. Patchy low-attenuation changes in the deep white matter consistent with small vessel ischemia. No significant change since previous study. No mass effect or midline shift. No abnormal extra-axial fluid collections. Gray-white matter junctions are distinct. Basal cisterns are not effaced. No evidence of acute intracranial hemorrhage. No depressed skull fractures. Visualized paranasal sinuses and mastoid air cells are not opacified. Vascular calcifications.  IMPRESSION: No acute intracranial abnormalities. Chronic atrophy and small vessel ischemic changes.   Electronically Signed   By: Burman Nieves M.D.   On: 07/04/2013 23:57   Dg Foot Complete Left  07/05/2013   CLINICAL DATA:  Fever.  EXAM: LEFT FOOT - COMPLETE 3+ VIEW  COMPARISON:  None.  FINDINGS: Periarticular erosive changes in the distal left 1st and 5th metatarsal heads and in the left 1st proximal phalanx. Changes could represent degenerative cysts or erosive arthritis such as gout. Dorsal soft tissue swelling. Vascular calcifications. No evidence of acute fracture or  subluxation.  IMPRESSION: Periarticular erosive changes in the left 1st and 5th metatarsal heads which could represent degenerative change versus erosive arthritis due to gout. Dorsal soft tissue swelling.   Electronically Signed   By: Burman Nieves M.D.   On: 07/05/2013 00:27    IVFs, tylenol IV ABX possible UTI  Dr Conley Rolls to admit  MDM  Dx: AMS, Fever, hematuria, ARI    Sunnie Nielsen, MD 07/05/13 2326

## 2013-07-05 NOTE — Progress Notes (Signed)
Utilization Review Completed.   Geralynn Capri, RN, BSN Nurse Case Manager  

## 2013-07-05 NOTE — Progress Notes (Signed)
Subjective: Left knee and ankle pain some better CT scan abd done- pending  Objective: Vital signs in last 24 hours: Temp:  [98 F (36.7 C)-100.5 F (38.1 C)] 98 F (36.7 C) (11/29 0602) Pulse Rate:  [74-80] 78 (11/29 0602) Resp:  [13-17] 16 (11/29 0602) BP: (97-116)/(52-59) 113/59 mmHg (11/29 0602) SpO2:  [93 %-100 %] 100 % (11/29 0602) Weight:  [87.8 kg (193 lb 9 oz)] 87.8 kg (193 lb 9 oz) (11/29 0602) Weight change:  Last BM Date: 07/02/13  Intake/Output from previous day: 11/28 0701 - 11/29 0700 In: 1960.4 [I.V.:1960.4] Out: 150 [Urine:150] Intake/Output this shift:    General appearance: alert Resp: clear to auscultation bilaterally Cardio: regular rate and rhythm Extremities: edema +2; legt knee and ankle sore to touch with swelling; left elbow swelling  nad pain  Lab Results:  Recent Labs  07/04/13 2334 07/04/13 2343  WBC 13.5*  --   HGB 10.5* 12.2*  HCT 29.4* 36.0*  PLT 203  --    BMET  Recent Labs  07/04/13 2334 07/04/13 2343  NA 137 140  K 3.5 3.5  CL 98 102  CO2 25  --   GLUCOSE 100* 100*  BUN 34* 32*  CREATININE 1.74* 2.10*  CALCIUM 9.0  --     Studies/Results: Ct Abdomen Pelvis Wo Contrast  07/05/2013   CLINICAL DATA:  Hematuria.  EXAM: CT ABDOMEN AND PELVIS WITHOUT CONTRAST  TECHNIQUE: Multidetector CT imaging of the abdomen and pelvis was performed following the standard protocol without intravenous contrast.  COMPARISON:  None.  FINDINGS: Liver normal. Spleen normal. Pancreas normal. Gallbladder is nondistended. No biliary distention.  Adrenals normal. Bilateral nephrolithiasis. 2.7 cm left renal cyst. No evidence of obstructing ureteral stone. No hydronephrosis or hydroureter. Bladder wall is thickened. This could be related to chronic bladder neck obstruction however primary bladder process including cystitis or bladder malignancy cannot be excluded. The prostate is enlarged and nodular. Prostatic malignancy cannot be excluded.  No  significant adenopathy noted. Shotty retroperitoneal lymph nodes are noted. Aorta normal caliber. Mesenteric and renal vascular calcification noted consistent with atherosclerotic vascular disease. Atherosclerotic vascular calcification present in the aorta and iliac vessels.  Appendectomy. Sigmoid colonic and left colonic diverticulosis. Stool present in colon. No evidence of bowel obstruction. No free air. Mild right inguinal hernia with minimal herniation of a tiny loop of bowel. No evidence of bowel strangulation or obstruction. Diffuse mild ascites.  Severe cardiomegaly. Coronary artery disease. Cardiac pacer. Bilateral pleural effusions. Mild atelectasis lung bases diffuse degenerative changes lumbar spine. Grade 1 spondylolisthesis L5-S1. Prominent Schmorl's node along the inferior endplate of L2. Diffuse and anasarca  IMPRESSION: 1. Bilateral nephrolithiasis. No evidence of obstructing urolithiasis or hydronephrosis. 2. Thickened bladder wall. Although this could be related to chronic bladder neck outlet obstruction, a primary bladder process such as cystitis or bladder cancer cannot be excluded. 3. Enlarged nodular prostate. Prostate malignancy cannot be excluded. 4. Moderate bilateral pleural effusions, mild ascites, and mild anasarca. 5. Severe cardiomegaly.  Coronary artery disease.  Cardiac pacer.   Electronically Signed   By: Maisie Fus  Register   On: 07/05/2013 09:46   Dg Chest 1 View  07/05/2013   CLINICAL DATA:  Fever.  EXAM: CHEST - 1 VIEW  COMPARISON:  For 09/2009  FINDINGS: Stable appearance of cardiac pacemaker. Cardiac enlargement with suggestion of mild pulmonary vascular congestion. Small bilateral pleural effusions. No evidence of edema or consolidation. No pneumothorax.  IMPRESSION: Cardiac enlargement with pulmonary vascular congestion and small bilateral pleural  effusions.   Electronically Signed   By: Burman Nieves M.D.   On: 07/05/2013 00:25   Ct Head Wo Contrast  07/04/2013    CLINICAL DATA:  Fever and confusion.  Weird dreams.  Recent UTI.  EXAM: CT HEAD WITHOUT CONTRAST  TECHNIQUE: Contiguous axial images were obtained from the base of the skull through the vertex without intravenous contrast.  COMPARISON:  12/31/2012  FINDINGS: Diffuse cerebral atrophy. No ventricular dilatation. Patchy low-attenuation changes in the deep white matter consistent with small vessel ischemia. No significant change since previous study. No mass effect or midline shift. No abnormal extra-axial fluid collections. Gray-white matter junctions are distinct. Basal cisterns are not effaced. No evidence of acute intracranial hemorrhage. No depressed skull fractures. Visualized paranasal sinuses and mastoid air cells are not opacified. Vascular calcifications.  IMPRESSION: No acute intracranial abnormalities. Chronic atrophy and small vessel ischemic changes.   Electronically Signed   By: Burman Nieves M.D.   On: 07/04/2013 23:57   Dg Foot Complete Left  07/05/2013   CLINICAL DATA:  Fever.  EXAM: LEFT FOOT - COMPLETE 3+ VIEW  COMPARISON:  None.  FINDINGS: Periarticular erosive changes in the distal left 1st and 5th metatarsal heads and in the left 1st proximal phalanx. Changes could represent degenerative cysts or erosive arthritis such as gout. Dorsal soft tissue swelling. Vascular calcifications. No evidence of acute fracture or subluxation.  IMPRESSION: Periarticular erosive changes in the left 1st and 5th metatarsal heads which could represent degenerative change versus erosive arthritis due to gout. Dorsal soft tissue swelling.   Electronically Signed   By: Burman Nieves M.D.   On: 07/05/2013 00:27    Medications: I have reviewed the patient's current medications.  Assessment/Plan: Fever/ UTI/ Hematuria Ct abd today; continue Rocephin IV- f/u on Culture urine Acute gout flare- left knee ; ankle and left elbow- prednisone; add colchichine; continu allopurinol 400 mg ( home dose) A/C renal  failure  ( baseline Cr 1.7) IVF gentle- lasix on hold Afib/ NSR/ anticoag- per pharmacy BP - ok COPD stable   LOS: 1 day   Sevanna Ballengee 07/05/2013, 10:04 AM

## 2013-07-06 LAB — GLUCOSE, CAPILLARY
Glucose-Capillary: 144 mg/dL — ABNORMAL HIGH (ref 70–99)
Glucose-Capillary: 130 mg/dL — ABNORMAL HIGH (ref 70–99)
Glucose-Capillary: 135 mg/dL — ABNORMAL HIGH (ref 70–99)

## 2013-07-06 LAB — CBC
HCT: 27 % — ABNORMAL LOW (ref 39.0–52.0)
Hemoglobin: 9.8 g/dL — ABNORMAL LOW (ref 13.0–17.0)
MCH: 34.1 pg — ABNORMAL HIGH (ref 26.0–34.0)
MCV: 94.1 fL (ref 78.0–100.0)
RBC: 2.87 MIL/uL — ABNORMAL LOW (ref 4.22–5.81)
RDW: 16.5 % — ABNORMAL HIGH (ref 11.5–15.5)
WBC: 7.8 10*3/uL (ref 4.0–10.5)

## 2013-07-06 LAB — URINE CULTURE: Colony Count: 35000

## 2013-07-06 LAB — PROTIME-INR: INR: 1.82 — ABNORMAL HIGH (ref 0.00–1.49)

## 2013-07-06 LAB — BASIC METABOLIC PANEL
CO2: 21 mEq/L (ref 19–32)
Chloride: 98 mEq/L (ref 96–112)
Creatinine, Ser: 1.53 mg/dL — ABNORMAL HIGH (ref 0.50–1.35)
Glucose, Bld: 132 mg/dL — ABNORMAL HIGH (ref 70–99)
Potassium: 3.6 mEq/L (ref 3.5–5.1)

## 2013-07-06 MED ORDER — WARFARIN SODIUM 5 MG PO TABS
5.0000 mg | ORAL_TABLET | Freq: Once | ORAL | Status: AC
  Administered 2013-07-06: 5 mg via ORAL
  Filled 2013-07-06: qty 1

## 2013-07-06 MED ORDER — FUROSEMIDE 40 MG PO TABS
40.0000 mg | ORAL_TABLET | Freq: Two times a day (BID) | ORAL | Status: DC
  Administered 2013-07-06 – 2013-07-08 (×5): 40 mg via ORAL
  Filled 2013-07-06 (×7): qty 1

## 2013-07-06 MED ORDER — LEVOTHYROXINE SODIUM 50 MCG PO TABS
50.0000 ug | ORAL_TABLET | Freq: Every day | ORAL | Status: DC
  Administered 2013-07-07 – 2013-07-08 (×2): 50 ug via ORAL
  Filled 2013-07-06 (×3): qty 1

## 2013-07-06 NOTE — Evaluation (Signed)
Physical Therapy Evaluation Patient Details Name: Jared Landry MRN: [id number] DOB: 10-04-1940 Today's Date: 07/06/2013 Time: 7829-5621 PT Time Calculation (min): 30 min  PT Assessment / Plan / Recommendation History of Present Illness  Jared Landry is an 72 y.o. male with hx of afib, CAD s/p DES, DM2, gout on colchicine PRN, regular allopurinol, HTN, ischemic cardiomyopathy, s/p IVCD, presents with acute onset of left MTP joint pain for three days unable to ambulate.  He has low grade fever as well.  He didn't take his colchicine, but has been compliant with his allopurinol.  He also had a little confusion according to his wife, but this has cleared up quickly.  Evaluation in the ER included a UA which showed microscopic hematuria, but no significant WBC nor leukocytes esterase.  His INR was slightly low at 1.7, but Cr went up from 0.8 to 1.7.  His CXR was clear, and WBC was 13K.  He was given some IVF, and felt markedly better.  Blood cultures were obtained and he was given IV Rocephin for ?UTI  Clinical Impression  Pt with gout of the left knee, ankle and elbow limiting ROM due to pain. Pt typically independent but endorses fatigue due to COPD at baseline as well. Pt will benefit from acute as well as HHPT to maximize independence, transfers, activity tolerance and safety. Recommend OOB daily with nursing staff.     PT Assessment  Patient needs continued PT services    Follow Up Recommendations  Home health PT    Does the patient have the potential to tolerate intense rehabilitation      Barriers to Discharge        Equipment Recommendations  Rolling walker with 5" wheels    Recommendations for Other Services     Frequency Min 3X/week    Precautions / Restrictions Precautions Precautions: Fall   Pertinent Vitals/Pain 5/10 pain left knee VSS     Mobility  Bed Mobility Bed Mobility: Rolling Right;Right Sidelying to Sit Rolling Right: 5: Supervision;With rail Right  Sidelying to Sit: 5: Supervision;With rails;HOB elevated Details for Bed Mobility Assistance: heavy reliance on rail which pt does not have at home as well as increased time Transfers Transfers: Sit to Stand;Stand to Sit Sit to Stand: 4: Min guard;From bed;From chair/3-in-1 Stand to Sit: 4: Min guard;To chair/3-in-1 Details for Transfer Assistance: cueing for scooting to the edge of the surface, anterior weight shift and hand placement Ambulation/Gait Ambulation/Gait Assistance: 5: Supervision Ambulation Distance (Feet): 90 Feet Assistive device: Rolling walker Ambulation/Gait Assistance Details: cueing for posture and position in RW Gait Pattern: Step-through pattern;Decreased stride length;Trunk flexed;Wide base of support Gait velocity: decreased Stairs: No    Exercises     PT Diagnosis: Difficulty walking;Acute pain  PT Problem List: Decreased strength;Decreased activity tolerance;Decreased range of motion;Decreased knowledge of use of DME;Decreased mobility PT Treatment Interventions: Gait training;Stair training;Functional mobility training;Therapeutic activities;Therapeutic exercise;Patient/family education;DME instruction     PT Goals(Current goals can be found in the care plan section) Acute Rehab PT Goals Patient Stated Goal: return home PT Goal Formulation: With patient Time For Goal Achievement: 07/13/13 Potential to Achieve Goals: Good  Visit Information  Last PT Received On: 07/06/13 Assistance Needed: +1 History of Present Illness: Jared Landry is an 72 y.o. male with hx of afib, CAD s/p DES, DM2, gout on colchicine PRN, regular allopurinol, HTN, ischemic cardiomyopathy, s/p IVCD, presents with acute onset of left MTP joint pain for three days unable to ambulate.  He has  low grade fever as well.  He didn't take his colchicine, but has been compliant with his allopurinol.  He also had a little confusion according to his wife, but this has cleared up quickly.   Evaluation in the ER included a UA which showed microscopic hematuria, but no significant WBC nor leukocytes esterase.  His INR was slightly low at 1.7, but Cr went up from 0.8 to 1.7.  His CXR was clear, and WBC was 13K.  He was given some IVF, and felt markedly better.  Blood cultures were obtained and he was given IV Rocephin for ?UTI       Prior Functioning  Home Living Family/patient expects to be discharged to:: Private residence Living Arrangements: Spouse/significant other Available Help at Discharge: Family;Available 24 hours/day Type of Home: House Home Access: Stairs to enter [employer] of Steps: 6 Home Layout: Two level;Able to live on main level with bedroom/bathroom;Other (Comment) (office upstairs) Alternate Level Stairs-Number of Steps: 13 Home Equipment: Cane - single point;Bedside commode;Shower seat - built in;Hand held shower head Prior Function Level of Independence: Independent with assistive device(s) Comments: pt uses cane for longer distances and rides in a cart at the stores due to fatigue Communication Communication: No difficulties    Cognition  Cognition Arousal/Alertness: Awake/alert Behavior During Therapy: WFL for tasks assessed/performed Overall Cognitive Status: Within Functional Limits for tasks assessed    Extremity/Trunk Assessment Upper Extremity Assessment Upper Extremity Assessment: Generalized weakness Lower Extremity Assessment Lower Extremity Assessment: Generalized weakness Cervical / Trunk Assessment Cervical / Trunk Assessment: Kyphotic   Balance    End of Session PT - End of Session Equipment Utilized During Treatment: Gait belt Activity Tolerance: Patient tolerated treatment well Patient left: in chair;with call bell/phone within reach Nurse Communication: Mobility status  GP     Delorse Lek 07/06/2013, 2:51 PM Delaney Meigs, PT [phone]

## 2013-07-06 NOTE — Progress Notes (Signed)
Subjective: Pt feel knee and foot pain better Still swelling in legs weakness  Objective: Vital signs in last 24 hours: Temp:  [97.6 F (36.4 C)-99.3 F (37.4 C)] 98.2 F (36.8 C) (11/30 0500) Pulse Rate:  [72-77] 74 (11/30 0500) Resp:  [17-18] 18 (11/30 0500) BP: (102-160)/(63-95) 116/63 mmHg (11/30 0500) SpO2:  [91 %-97 %] 97 % (11/30 0825) Weight change:  Last BM Date: 07/02/13  Intake/Output from previous day: 11/29 0701 - 11/30 0700 In: 3351.3 [P.O.:1400; I.V.:1951.3] Out: 215 [Urine:215] Intake/Output this shift:    General appearance: alert Resp: diminished breath sounds bibasilar Cardio: regular rate and rhythm GI: soft, non-tender; bowel sounds normal; no masses,  no organomegaly Extremities: +2 edema  Lab Results:  Recent Labs  07/04/13 2334 07/04/13 2343 07/06/13 0555  WBC 13.5*  --  7.8  HGB 10.5* 12.2* 9.8*  HCT 29.4* 36.0* 27.0*  PLT 203  --  198   BMET  Recent Labs  07/04/13 2334 07/04/13 2343 07/06/13 0555  NA 137 140 134*  K 3.5 3.5 3.6  CL 98 102 98  CO2 25  --  21  GLUCOSE 100* 100* 132*  BUN 34* 32* 38*  CREATININE 1.74* 2.10* 1.53*  CALCIUM 9.0  --  8.8    Studies/Results: Ct Abdomen Pelvis Wo Contrast  07/05/2013   CLINICAL DATA:  Hematuria.  EXAM: CT ABDOMEN AND PELVIS WITHOUT CONTRAST  TECHNIQUE: Multidetector CT imaging of the abdomen and pelvis was performed following the standard protocol without intravenous contrast.  COMPARISON:  None.  FINDINGS: Liver normal. Spleen normal. Pancreas normal. Gallbladder is nondistended. No biliary distention.  Adrenals normal. Bilateral nephrolithiasis. 2.7 cm left renal cyst. No evidence of obstructing ureteral stone. No hydronephrosis or hydroureter. Bladder wall is thickened. This could be related to chronic bladder neck obstruction however primary bladder process including cystitis or bladder malignancy cannot be excluded. The prostate is enlarged and nodular. Prostatic malignancy cannot  be excluded.  No significant adenopathy noted. Shotty retroperitoneal lymph nodes are noted. Aorta normal caliber. Mesenteric and renal vascular calcification noted consistent with atherosclerotic vascular disease. Atherosclerotic vascular calcification present in the aorta and iliac vessels.  Appendectomy. Sigmoid colonic and left colonic diverticulosis. Stool present in colon. No evidence of bowel obstruction. No free air. Mild right inguinal hernia with minimal herniation of a tiny loop of bowel. No evidence of bowel strangulation or obstruction. Diffuse mild ascites.  Severe cardiomegaly. Coronary artery disease. Cardiac pacer. Bilateral pleural effusions. Mild atelectasis lung bases diffuse degenerative changes lumbar spine. Grade 1 spondylolisthesis L5-S1. Prominent Schmorl's node along the inferior endplate of L2. Diffuse and anasarca  IMPRESSION: 1. Bilateral nephrolithiasis. No evidence of obstructing urolithiasis or hydronephrosis. 2. Thickened bladder wall. Although this could be related to chronic bladder neck outlet obstruction, a primary bladder process such as cystitis or bladder cancer cannot be excluded. 3. Enlarged nodular prostate. Prostate malignancy cannot be excluded. 4. Moderate bilateral pleural effusions, mild ascites, and mild anasarca. 5. Severe cardiomegaly.  Coronary artery disease.  Cardiac pacer.   Electronically Signed   By: Maisie Fus  Register   On: 07/05/2013 09:46   Dg Chest 1 View  07/05/2013   CLINICAL DATA:  Fever.  EXAM: CHEST - 1 VIEW  COMPARISON:  For 09/2009  FINDINGS: Stable appearance of cardiac pacemaker. Cardiac enlargement with suggestion of mild pulmonary vascular congestion. Small bilateral pleural effusions. No evidence of edema or consolidation. No pneumothorax.  IMPRESSION: Cardiac enlargement with pulmonary vascular congestion and small bilateral pleural effusions.  Electronically Signed   By: Burman Nieves M.D.   On: 07/05/2013 00:25   Ct Head Wo  Contrast  07/04/2013   CLINICAL DATA:  Fever and confusion.  Weird dreams.  Recent UTI.  EXAM: CT HEAD WITHOUT CONTRAST  TECHNIQUE: Contiguous axial images were obtained from the base of the skull through the vertex without intravenous contrast.  COMPARISON:  12/31/2012  FINDINGS: Diffuse cerebral atrophy. No ventricular dilatation. Patchy low-attenuation changes in the deep white matter consistent with small vessel ischemia. No significant change since previous study. No mass effect or midline shift. No abnormal extra-axial fluid collections. Gray-white matter junctions are distinct. Basal cisterns are not effaced. No evidence of acute intracranial hemorrhage. No depressed skull fractures. Visualized paranasal sinuses and mastoid air cells are not opacified. Vascular calcifications.  IMPRESSION: No acute intracranial abnormalities. Chronic atrophy and small vessel ischemic changes.   Electronically Signed   By: Burman Nieves M.D.   On: 07/04/2013 23:57   Dg Foot Complete Left  07/05/2013   CLINICAL DATA:  Fever.  EXAM: LEFT FOOT - COMPLETE 3+ VIEW  COMPARISON:  None.  FINDINGS: Periarticular erosive changes in the distal left 1st and 5th metatarsal heads and in the left 1st proximal phalanx. Changes could represent degenerative cysts or erosive arthritis such as gout. Dorsal soft tissue swelling. Vascular calcifications. No evidence of acute fracture or subluxation.  IMPRESSION: Periarticular erosive changes in the left 1st and 5th metatarsal heads which could represent degenerative change versus erosive arthritis due to gout. Dorsal soft tissue swelling.   Electronically Signed   By: Burman Nieves M.D.   On: 07/05/2013 00:27    Medications: I have reviewed the patient's current medications.  Assessment/Plan: Fever/ UTI/ Hematuria- CT scan abd/ pelvic- thickened bladder- urology consult eval   continue Rocephin IV- f/u on Culture urine- pending  Acute gout flare- left knee ; ankle and left  elbow- better; prednisone; add colchichine; continu allopurinol 400 mg ( home dose)  A/C renal failure ( baseline Cr 1.7)improved-  Dec IVF 50 cc Leg edema- with small bilat plural effusion and ascites- lasix 40 mg bid DM- ok Neuropathy- could not tol gabapentin. Afib/ NSR/ anticoag- per pharmacy  BP - ok  COPD stable Decondition/ weakness- PT consult Social work consult- may benefit from SNF- Rehab    LOS: 2 days   Jared Landry 07/06/2013, 9:20 AM

## 2013-07-06 NOTE — Consult Note (Signed)
Urology Consult  Requesting provider:  Dr. Houston Siren & Dr. Georgann Housekeeper.  CC: Gross hematuria. Renal cyst. Nephrolithiasis. BPH.  HPI: 72 year old male admitted for gout flair.   #1 Gross hematuria. Occurred once yesterday. Urine culture pending. UA: TNTC RBC, LE positive, nitrite negative. CT abd/pelvis w/o IV contrast obtained; I reviewed the images. Has a thickened bladder wall, but no discrete mass. Positive nephrolithiasis. He has a history smoking for >40 years; quit 7 years ago. Negative dysuria. Nothing makes it better or worse. No clots. Discussed that gross hematuria can be due to UTI and/or malignancy vs other causes. Recommended outpatient cystoscopy; discussed risks/benefits/SEs. Negative family h/o prostate/bladder/renal cancer. Screened by PCP yearly w/ PSA- he says it's normal.  #2 Nephrolithiasis. Bilateral. Size: RLP 5mm; LLP 8 mm & 7 mm. Non-obstructing. This is a new problem. Not associated with flank pain. He drinks well water. We discussed management options.  #3 Renal cyst. Left kidney. Quality: hypodense, measuring 9 HU w/o contrast. Size: 2.7 cm. Not associated w/ flank pain. Nothing makes it better or worse.  #4 BPH. Seen to be enlarged on CT. He says this is a chronic problem. Positive: nocturia and frequency at baseline. Nothing makes it better or worse. Rectal exam reveals prostate to be 45g; negative nodules. Negative family h/o prostate/bladder/renal cancer. Screened by PCP yearly w/ PSA- he says it's normal.   PMH: Past Medical History  Diagnosis Date  . Atrial fibrillation     Cardioversion 07/20/11  transiently <3h successful  . CAD (coronary artery disease)     s/p drug eluting stent  . Diabetes mellitus     type II  . Hypertension   . Gout   . Osteoarthritis   . COPD (chronic obstructive pulmonary disease)   . 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 Jude  . Ventricular tachycardia     terminated by the ICD  .  Chronic systolic heart failure     PSH: Past Surgical History  Procedure Laterality Date  . Appendectomy    . Tonsillectomy    . Vasectomy    . Surgical i &d after brown recluse inner thigh    . Cardiac defibrillator placement      St. Jude   . Cardioversion  07/20/2011    Procedure: CARDIOVERSION;  Surgeon: Donato Schultz, MD;  Location: [facility] OR;  Service: Cardiovascular;  Laterality: N/A;  . Cardioversion  01/18/2012    Procedure: CARDIOVERSION;  Surgeon: Donato Schultz, MD;  Location: [facility] OR;  Service: Cardiovascular;  Laterality: N/A;    Allergies: Allergies  Allergen Reactions  . Gabapentin Other (See Comments)    May cause tremors and shaking    Medications: Prescriptions prior to admission  Medication Sig Dispense Refill  . albuterol (PROVENTIL HFA;VENTOLIN HFA) 108 (90 BASE) MCG/ACT inhaler Inhale 2 puffs into the lungs every 6 (six) hours as needed. For shortness of breath.      . allopurinol (ZYLOPRIM) 100 MG tablet Take 100 mg by mouth daily.      Marland Kitchen allopurinol (ZYLOPRIM) 300 MG tablet Take 300 mg by mouth daily.      Marland Kitchen aspirin EC 81 MG tablet Take 81 mg by mouth daily.      . Astaxanthin 4 MG CAPS Take 1 capsule by mouth daily.      Marland Kitchen atorvastatin (LIPITOR) 10 MG tablet Take 5 mg by mouth daily.       Marland Kitchen b complex vitamins tablet Take 1 tablet  by mouth every other day. Alternate with Zinc      . cholecalciferol (VITAMIN D) 1000 UNITS tablet Take 2,000 Units by mouth every other day.       . Coenzyme Q10 (CO Q-10) 100 MG CAPS Take 1 tablet by mouth daily. Alternate with Magnesium      . colchicine 0.6 MG tablet Take 0.6 mg by mouth daily as needed (for gout flare ups).       . Fluticasone-Salmeterol (ADVAIR) 100-50 MCG/DOSE AEPB Inhale 1 puff into the lungs 2 (two) times daily as needed.       . furosemide (LASIX) 20 MG tablet Take 20 mg by mouth daily. May take an additional dose if needed for extreme edema      . levothyroxine (SYNTHROID) 25 MCG tablet Take 25 mcg by mouth  daily before breakfast.      . Magnesium 300 MG CAPS Take 1 capsule by mouth every other day.       . metoprolol succinate (TOPROL-XL) 100 MG 24 hr tablet Take 100 mg by mouth daily. Take with or immediately following a meal.      . warfarin (COUMADIN) 7.5 MG tablet Take 1.875-3.75 mg by mouth daily. Takes a half tablet daily except Monday and Friday, then takes a quarter tablet      . amiodarone (PACERONE) 200 MG tablet Take 1 tablet (200 mg total) by mouth daily.  30 tablet  12  . Zinc 100 MG TABS Take 1 tablet by mouth every other day. Alternate with B Complex         Social History: History   Social History  . Marital Status: Married    Spouse Name: N/A    Number of Children: N/A  . Years of Education: N/A   Occupational History  . Not on file.   Social History Main Topics  . Smoking status: Former Smoker -- 0.30 packs/day    Types: Cigarettes    Quit date: 10/29/2006  . Smokeless tobacco: Not on file  . Alcohol Use: 16.8 oz/week    28 Shots of liquor per week  . Drug Use: No  . Sexual Activity: Not on file   Other Topics Concern  . Not on file   Social History Narrative  . No narrative on file    Family History: Family History  Problem Relation Age of Onset  . Heart failure Mother   . Diabetes type II Sister   . Appendicitis Father     rupture    Review of Systems: Positive: SOB (baseline COPD), dizziness, joint pain. Negative: Nausea, emesis, flank pain, or dysuria.  A further 10 point review of systems was negative except what is listed in the HPI.  Physical Exam: Filed Vitals:   07/06/13 1322  BP: 111/62  Pulse: 73  Temp: 97.5 F (36.4 C)  Resp: 18    General: No acute distress.  Awake. Head:  Normocephalic.  Atraumatic. ENT:  EOMI.  Mucous membranes moist Neck:  Supple.  No lymphadenopathy. CV:  S1 present. S2 present. Regular rate. Pulmonary: Equal effort bilaterally.  Clear to auscultation bilaterally. Abdomen: Soft.  Non- tender to  palpation. Skin:  Normal turgor.  No visible rash. Extremity: No gross deformity of bilateral upper extremities.  No gross deformity of    bilateral lower extremities. Neurologic: Alert. Appropriate mood.  Penis:  Circumcised.  No lesions. Rectal:  45 g, no nodules/induration/fluctuance.  Studies:  Recent Labs     07/04/13  2334  07/04/13  2343  07/06/13  0555  HGB  10.5*  12.2*  9.8*  WBC  13.5*   --   7.8  PLT  203   --   198    Recent Labs     07/04/13  2334  07/04/13  2343  07/06/13  0555  NA  137  140  134*  K  3.5  3.5  3.6  CL  98  102  98  CO2  25   --   21  BUN  34*  32*  38*  CREATININE  1.74*  2.10*  1.53*  CALCIUM  9.0   --   8.8  GFRNONAA  37*   --   43*  GFRAA  43*   --   50*     Recent Labs     07/04/13  2334  07/06/13  0555  INR  1.69*  1.82*     No components found with this basename: ABG,     Assessment:  Gross hematuria. Nephrolithiasis. Renal cyst. BPH.  Plan:   #1 Gross hematuria.   -Follow up urine culture and treat accordingly. -Will schedule further outpatient work up with cystoscopy. I gave him my office contact info.  #2 Nephrolithiasis.   Follow up as outpatient. Will consider metabolic work up.  #3 Renal cyst.   Simple appearing. No further follow up indicated.  #4 BPH.  Continue PSA via PCP. No indication for medication at this time.  Pager: [phone]    CC: Dr. Houston Siren & Dr. Georgann Housekeeper.

## 2013-07-06 NOTE — Progress Notes (Signed)
ANTICOAGULATION CONSULT NOTE - Follow Up Consult  Pharmacy Consult for Coumadin Indication: afib  Allergies  Allergen Reactions  . Gabapentin Other (See Comments)    May cause tremors and shaking    Patient Measurements: Height: 5\' 9"  (175.3 cm) Weight: 193 lb 9 oz (87.8 kg) IBW/kg (Calculated) : 70.7 Heparin Dosing Weight:   Vital Signs: Temp: 98 F (36.7 C) (11/30 0921) Temp src: Oral (11/30 0500) BP: 110/63 mmHg (11/30 0921) Pulse Rate: 69 (11/30 0921)  Labs:  Recent Labs  07/04/13 2334 07/04/13 2343 07/06/13 0555  HGB 10.5* 12.2* 9.8*  HCT 29.4* 36.0* 27.0*  PLT 203  --  198  LABPROT 19.4*  --  20.5*  INR 1.69*  --  1.82*  CREATININE 1.74* 2.10* 1.53*    Estimated Creatinine Clearance: 47.1 ml/min (by C-G formula based on Cr of 1.53).   Assessment: AMS/fever, likely 2/2 gout. Fever/UTI/hematuria  Anticoagulation: Afib. Coumadin 3.75 mg daily except 1.875 mg MonFri at home for Afib with admit INR 1.69. (Perhaps we can get rid of the quarter tab dose at discharge, perhaps 4mg  and 2mg ). INR up to 1.82 today.  Infectious Disease: Tmax 99.3.LA 1.62. WBC 13.5 down to 7.8  Cardiovascular CAD s/p PCI Afib Htn CHf s/p ICD. VSS Meds: po Amiodarone, ASA 81mg , Lipitor 5mg , po Lasix, Magox, Toprol XL, -Leg edema- with small bilat plural effusion and ascites  Endocrinology DM on SSI, Synthroid, CBG 98-164  Gastrointestinal / Nutrition: Tbili elevated at 2. F/u CT abdomen. Acute gout (L knee, ankle, L elbow): allopurinol, Colchicine, prednisone. Other: Bcomplex +C, Vit D, Zinc, po PPI  Neurology  Nephrology: Scr 1.53 down. CT scan abd/ pelvic- thickened bladder- urology consult eval  Pulmonary COPD, dulera, prednisone (for gout)  Hematology / Oncology  PTA Medication Issues: none   Goal of Therapy:  INR 2-3 Monitor platelets by anticoagulation protocol: Yes   Plan:  Repeat Coumadin 5mg  po x 1 again tonight.  Jared Landry, PharmD, [facility] Clinical  Staff Pharmacist Pager [phone]  Misty Stanley Stillinger 07/06/2013,11:16 AM

## 2013-07-07 LAB — GLUCOSE, CAPILLARY
Glucose-Capillary: 103 mg/dL — ABNORMAL HIGH (ref 70–99)
Glucose-Capillary: 125 mg/dL — ABNORMAL HIGH (ref 70–99)
Glucose-Capillary: 134 mg/dL — ABNORMAL HIGH (ref 70–99)

## 2013-07-07 LAB — URINE CULTURE: Colony Count: 50000

## 2013-07-07 LAB — BASIC METABOLIC PANEL
Calcium: 9 mg/dL (ref 8.4–10.5)
GFR calc Af Amer: 42 mL/min — ABNORMAL LOW (ref >90)
GFR calc non Af Amer: 36 mL/min — ABNORMAL LOW (ref >90)
Potassium: 3.9 mEq/L (ref 3.5–5.1)
Sodium: 135 mEq/L (ref 135–145)

## 2013-07-07 LAB — PROTIME-INR
INR: 1.86 — ABNORMAL HIGH (ref 0.00–1.49)
Prothrombin Time: 20.9 seconds — ABNORMAL HIGH (ref 11.6–15.2)

## 2013-07-07 MED ORDER — AMIODARONE HCL 200 MG PO TABS: 200.0000 mg | ORAL_TABLET | Freq: Every day | ORAL | Status: AC

## 2013-07-07 MED ORDER — PREDNISONE 20 MG PO TABS
30.0000 mg | ORAL_TABLET | Freq: Every day | ORAL | Status: DC
  Administered 2013-07-07 – 2013-07-08 (×2): 30 mg via ORAL
  Filled 2013-07-07 (×4): qty 1

## 2013-07-07 MED ORDER — COLCHICINE 0.6 MG PO TABS: 0.6000 mg | ORAL_TABLET | Freq: Two times a day (BID) | ORAL | Status: DC

## 2013-07-07 MED ORDER — WARFARIN SODIUM 6 MG PO TABS
6.0000 mg | ORAL_TABLET | Freq: Once | ORAL | Status: AC
  Administered 2013-07-07: 6 mg via ORAL
  Filled 2013-07-07: qty 1

## 2013-07-07 MED ORDER — LINAGLIPTIN 5 MG PO TABS
5.0000 mg | ORAL_TABLET | Freq: Every day | ORAL | Status: DC
  Administered 2013-07-07 – 2013-07-08 (×2): 5 mg via ORAL
  Filled 2013-07-07 (×3): qty 1

## 2013-07-07 MED ORDER — WARFARIN SODIUM 4 MG PO TABS: ORAL_TABLET | ORAL | Status: AC

## 2013-07-07 MED ORDER — FUROSEMIDE 40 MG PO TABS: 40.0000 mg | ORAL_TABLET | Freq: Two times a day (BID) | ORAL | Status: DC

## 2013-07-07 MED ORDER — SITAGLIPTIN PHOSPHATE 50 MG PO TABS: 50.0000 mg | ORAL_TABLET | Freq: Every day | ORAL | Status: DC

## 2013-07-07 MED ORDER — LEVOTHYROXINE SODIUM 50 MCG PO TABS: 50.0000 ug | ORAL_TABLET | Freq: Every day | ORAL | Status: AC

## 2013-07-07 MED ORDER — PREDNISONE 10 MG PO TABS: ORAL_TABLET | ORAL | Status: DC

## 2013-07-07 NOTE — Clinical Social Work Psychosocial (Signed)
Clinical Social Work Department BRIEF PSYCHOSOCIAL ASSESSMENT 07/07/2013  Patient:  Jared Landry, Jared Landry     Account Number:  [id number]     Admit date:  07/04/2013  Clinical Social Worker:  Delmer Islam  Date/Time:  07/07/2013 12:37 PM  Referred by:  Physician  Date Referred:  07/06/2013 Referred for  SNF Placement   Other Referral:   Interview type:  Family Other interview type:   CSW talked with patient's wife by phone regarding discharge plans.    PSYCHOSOCIAL DATA Living Status:  WIFE Admitted from facility:   Level of care:   Primary support name:  Moo Gravley Primary support relationship to patient:  SPOUSE Degree of support available:   Wife concerned and involved with patient's care. Phone numbers" ([phone]. Email: [email or web address]    CURRENT CONCERNS Current Concerns  Post-Acute Placement   Other Concerns:    SOCIAL WORK ASSESSMENT / PLAN CSW visited room and patient was asleep so call made to wife regarding discharge plans. Mrs. Cordial reported that patient requires more care than she can provide at this time and rehab will be helpful. Wife works at home and provided CSW with her email address so that she could receive SNF list. Wife also aware that patient should be ready for d/c on Tuesday. CSW informed Mrs. Maiello that she will be advised of facility responses.   Assessment/plan status:  Psychosocial Support/Ongoing Assessment of Needs Other assessment/ plan:   Information/referral to community resources:   Wife emailed SNF list for [facility]    PATIENT'S/FAMILY'S RESPONSE TO PLAN OF CARE: Mrs. Eckrich was open and receptive to talking with CSW about discharge plans and feels [facility] rehab will be beneficial for patient as she feels patient requires more care than she can provide at this time.

## 2013-07-07 NOTE — Progress Notes (Signed)
ANTICOAGULATION CONSULT NOTE - Follow Up Consult  Pharmacy Consult for Coumadin Indication: afib  Allergies  Allergen Reactions  . Gabapentin Other (See Comments)    May cause tremors and shaking    Patient Measurements: Height: 5\' 9"  (175.3 cm) Weight: 193 lb 9 oz (87.8 kg) IBW/kg (Calculated) : 70.7 Heparin Dosing Weight:   Vital Signs: Temp: 97.7 F (36.5 C) (12/01 0923) Temp src: Oral (12/01 0923) BP: 134/77 mmHg (12/01 0923) Pulse Rate: 73 (12/01 0923)  Labs:  Recent Labs  07/04/13 2334 07/04/13 2343 07/06/13 0555 07/07/13 0750  HGB 10.5* 12.2* 9.8*  --   HCT 29.4* 36.0* 27.0*  --   PLT 203  --  198  --   LABPROT 19.4*  --  20.5* 20.9*  INR 1.69*  --  1.82* 1.86*  CREATININE 1.74* 2.10* 1.53* 1.77*    Estimated Creatinine Clearance: 40.7 ml/min (by C-G formula based on Cr of 1.77).   Assessment: AMS/fever, likely 2/2 gout. Fever/UTI/hematuria  Anticoagulation: Afib. Coumadin 3.75 mg daily except 1.875 mg MonFri at home for Afib with admit INR 1.69. (Perhaps we can get rid of the quarter tab dose at discharge, perhaps 4mg  and 2mg ). INR up to 1.86 today. No CBC today. No signs of bleeding noted.   Goal of Therapy:  INR 2-3 Monitor platelets by anticoagulation protocol: Yes   Plan:  1) Coumadin 6 mg po x 1 again tonight. 2) F/U INR in AM 3) Monitor CBC and s/s of bleeding   Vinnie Level, PharmD.  Clinical Pharmacist Pager [phone]

## 2013-07-07 NOTE — Clinical Social Work Placement (Addendum)
Clinical Social Work Department CLINICAL SOCIAL WORK PLACEMENT NOTE 07/07/2013  Patient:  Jared Landry, Jared Landry  Account Number:  [id number] Admit date:  07/04/2013  Clinical Social Worker:  Genelle Bal, LCSW  Date/time:  07/07/2013 12:48 PM  Clinical Social Work is seeking post-discharge placement for this patient at the following level of care:   SKILLED NURSING   (*CSW will update this form in Epic as items are completed)   07/07/2013  Patient/family provided with [facility] Department of Clinical Social Work's list of facilities offering this level of care within the geographic area requested by the patient (or if unable, by the patient's family).  07/07/2013  Patient/family informed of their freedom to choose among providers that offer the needed level of care, that participate in Medicare, Medicaid or managed care program needed by the patient, have an available bed and are willing to accept the patient.    Patient/family informed of [facility]' ownership interest in [facility], as well as of the fact that they are under no obligation to receive care at this facility.  PASARR submitted to EDS on 07/07/2013 PASARR number received from [employer] on 07/07/2013 - [phone] [id number]   FL2 transmitted to all facilities in geographic area requested by pt/family on  07/07/2013 FL2 transmitted to all facilities within larger geographic area on   Patient informed that his/her managed care company has contracts with or will negotiate with  certain facilities, including the following:     Patient/family informed of bed offers received: 07/07/13  Patient chooses bed at [facility] Physician recommends and patient chooses bed at    Patient to be transferred to [facility] on 07/08/13 Patient to be transported by ambulance    The following physician request were entered in Epic:   Additional Comments:

## 2013-07-07 NOTE — Progress Notes (Signed)
Subjective: Pt urine better- clear Still leg weakness and pain   Objective: Vital signs in last 24 hours: Temp:  [97.3 F (36.3 C)-98 F (36.7 C)] 97.4 F (36.3 C) (12/01 0500) Pulse Rate:  [69-74] 74 (12/01 0500) Resp:  [17-18] 18 (12/01 0500) BP: (110-122)/(62-85) 116/73 mmHg (12/01 0500) SpO2:  [93 %-100 %] 98 % (12/01 0500) Weight change:  Last BM Date: 07/02/13 (refuses colace-attempted to educate)  Intake/Output from previous day: 11/30 0701 - 12/01 0700 In: 2383.3 [P.O.:1040; I.V.:1343.3] Out: 375 [Urine:375] Intake/Output this shift:    General appearance: alert Resp: clear to auscultation bilaterally Cardio: regular rate and rhythm Extremities: edema +2  Lab Results:  Recent Labs  07/04/13 2334 07/04/13 2343 07/06/13 0555  WBC 13.5*  --  7.8  HGB 10.5* 12.2* 9.8*  HCT 29.4* 36.0* 27.0*  PLT 203  --  198   BMET  Recent Labs  07/04/13 2334 07/04/13 2343 07/06/13 0555  NA 137 140 134*  K 3.5 3.5 3.6  CL 98 102 98  CO2 25  --  21  GLUCOSE 100* 100* 132*  BUN 34* 32* 38*  CREATININE 1.74* 2.10* 1.53*  CALCIUM 9.0  --  8.8    Studies/Results: Ct Abdomen Pelvis Wo Contrast  07/05/2013   CLINICAL DATA:  Hematuria.  EXAM: CT ABDOMEN AND PELVIS WITHOUT CONTRAST  TECHNIQUE: Multidetector CT imaging of the abdomen and pelvis was performed following the standard protocol without intravenous contrast.  COMPARISON:  None.  FINDINGS: Liver normal. Spleen normal. Pancreas normal. Gallbladder is nondistended. No biliary distention.  Adrenals normal. Bilateral nephrolithiasis. 2.7 cm left renal cyst. No evidence of obstructing ureteral stone. No hydronephrosis or hydroureter. Bladder wall is thickened. This could be related to chronic bladder neck obstruction however primary bladder process including cystitis or bladder malignancy cannot be excluded. The prostate is enlarged and nodular. Prostatic malignancy cannot be excluded.  No significant adenopathy noted.  Shotty retroperitoneal lymph nodes are noted. Aorta normal caliber. Mesenteric and renal vascular calcification noted consistent with atherosclerotic vascular disease. Atherosclerotic vascular calcification present in the aorta and iliac vessels.  Appendectomy. Sigmoid colonic and left colonic diverticulosis. Stool present in colon. No evidence of bowel obstruction. No free air. Mild right inguinal hernia with minimal herniation of a tiny loop of bowel. No evidence of bowel strangulation or obstruction. Diffuse mild ascites.  Severe cardiomegaly. Coronary artery disease. Cardiac pacer. Bilateral pleural effusions. Mild atelectasis lung bases diffuse degenerative changes lumbar spine. Grade 1 spondylolisthesis L5-S1. Prominent Schmorl's node along the inferior endplate of L2. Diffuse and anasarca  IMPRESSION: 1. Bilateral nephrolithiasis. No evidence of obstructing urolithiasis or hydronephrosis. 2. Thickened bladder wall. Although this could be related to chronic bladder neck outlet obstruction, a primary bladder process such as cystitis or bladder cancer cannot be excluded. 3. Enlarged nodular prostate. Prostate malignancy cannot be excluded. 4. Moderate bilateral pleural effusions, mild ascites, and mild anasarca. 5. Severe cardiomegaly.  Coronary artery disease.  Cardiac pacer.   Electronically Signed   By: Maisie Fus  Register   On: 07/05/2013 09:46    Medications: I have reviewed the patient's current medications.  Assessment/Plan: Fever/ UTI/ Hematuria- CT scan abd/ pelvic- thickened bladder- urology consult noted- out pt cystoscopy- urine clear now continue Rocephin IV- for one more day; Culture urine- negative-  Acute gout flare- left knee ; ankle and left elbow- better; prednisone taper;  colchichine; continu allopurinol 400 mg ( home dose)  A/C renal failure ( baseline Cr 1.7)improved- IVF- [place]ock Leg edema- with  small bilat plural effusion and ascites- lasix 40 mg bid  DM- ok - restart januvia 50-  mg Neuropathy- could not tol gabapentin.  Afib/ NSR/ anticoag- per pharmacy ( coumadin 4 mg daily; with 2 mg on mon; Friday) - f/u INR  BP - ok  COPD stable  Decondition/ weakness- PT consult - pt still weak; have stair in home; wife cannot handle-  Need short SNF- rehab Social work consult- may benefit from SNF- Rehab possible  Tomorrow.   LOS: 3 days   Jared Landry 07/07/2013, 7:46 AM

## 2013-07-07 NOTE — Progress Notes (Signed)
Physical Therapy Treatment Patient Details Name: ROBIN PAFFORD MRN: [id number] DOB: [birth year] Today's Date: 07/07/2013 Time: 8295-6213 PT Time Calculation (min): 28 min  PT Assessment / Plan / Recommendation  History of Present Illness JARRET TORRE is an 72 y.o. male with hx of afib, CAD s/p DES, DM2, gout on colchicine PRN, regular allopurinol, HTN, ischemic cardiomyopathy, s/p IVCD, presents with acute onset of left MTP joint pain for three days unable to ambulate.  He has low grade fever as well.  He didn't take his colchicine, but has been compliant with his allopurinol.  He also had a little confusion according to his wife, but this has cleared up quickly.  Evaluation in the ER included a UA which showed microscopic hematuria, but no significant WBC nor leukocytes esterase.  His INR was slightly low at 1.7, but Cr went up from 0.8 to 1.7.  His CXR was clear, and WBC was 13K.  He was given some IVF, and felt markedly better.  Blood cultures were obtained and he was given IV Rocephin for ?UTI   PT Comments   Patient demonstrates increased assist needed for stair negotiation and wife unable to provide physical assist.  Will benefit from STSNF rehab at d/c to allow pt to return to independent prior to d/c  Follow Up Recommendations  SNF     Does the patient have the potential to tolerate intense rehabilitation   N/A  Barriers to Discharge  None      Equipment Recommendations  Rolling walker with 5" wheels    Recommendations for Other Services  None  Frequency Min 3X/week   Progress towards PT Goals Progress towards PT goals: Progressing toward goals  Plan Discharge plan needs to be updated    Precautions / Restrictions Precautions Precautions: Fall   Pertinent Vitals/Pain Min-mod c/o joint pain with ambulation and stairs    Mobility  Bed Mobility Bed Mobility: Sit to Supine;Scooting to HOB Sit to Supine: 4: Min guard;HOB flat Scooting to HOB: 5: Supervision Details for Bed  Mobility Assistance: increased effort to perform mobility tasks (lifting legs into bed uses momentum, to scoot to head of bed reliance on UE assist Transfers Sit to Stand: From chair/3-in-1;4: Min assist;5: Supervision;With armrests Stand to Sit: 5: Supervision;To chair/3-in-1 Details for Transfer Assistance: uncontrolled descent into chair; assist initially from low recliner, then pt stood with supervision from higher seat of wheelchair with momentum Ambulation/Gait Ambulation/Gait Assistance: 4: Min guard Ambulation Distance (Feet): 60 Feet Assistive device: Rolling walker Ambulation/Gait Assistance Details: relies on UE support and demonstrates increased time for all mobility Gait Pattern: Trunk flexed;Decreased hip/knee flexion - right;Decreased hip/knee flexion - left Stairs: Yes Stairs Assistance: 3: Mod assist Stairs Assistance Details (indicate cue type and reason): cues for up with right and down with left first due to left knee pain; demonstrates decreasd control with descending stairs; needs lifting help to ascend steps Stair Management Technique: Forwards;Sideways;Step to pattern;One rail Left Number of Stairs: 3      PT Goals (current goals can now be found in the care plan section)    Visit Information  Last PT Received On: 07/07/13 Assistance Needed: +1 History of Present Illness: AVEON COLQUHOUN is an 72 y.o. male with hx of afib, CAD s/p DES, DM2, gout on colchicine PRN, regular allopurinol, HTN, ischemic cardiomyopathy, s/p IVCD, presents with acute onset of left MTP joint pain for three days unable to ambulate.  He has low grade fever as well.  He didn't take his  colchicine, but has been compliant with his allopurinol.  He also had a little confusion according to his wife, but this has cleared up quickly.  Evaluation in the ER included a UA which showed microscopic hematuria, but no significant WBC nor leukocytes esterase.  His INR was slightly low at 1.7, but Cr went up  from 0.8 to 1.7.  His CXR was clear, and WBC was 13K.  He was given some IVF, and felt markedly better.  Blood cultures were obtained and he was given IV Rocephin for ?UTI    Subjective Data   Good to try isn't it?   Cognition  Cognition Arousal/Alertness: Awake/alert Behavior During Therapy: WFL for tasks assessed/performed Overall Cognitive Status: Within Functional Limits for tasks assessed    Balance  Balance Balance Assessed: Yes Static Standing Balance Static Standing - Balance Support: No upper extremity supported;During functional activity Static Standing - Level of Assistance: 5: Stand by assistance Static Standing - Comment/# of Minutes: washing hands at sink, toileting in bathroom  End of Session PT - End of Session Equipment Utilized During Treatment: Gait belt Activity Tolerance: Patient tolerated treatment well Patient left: in bed;with call bell/phone within reach   GP     [facility] 07/07/2013, 2:26 PM [place], [place] [phone] 07/07/2013

## 2013-07-08 LAB — GLUCOSE, CAPILLARY
Glucose-Capillary: 105 mg/dL — ABNORMAL HIGH (ref 70–99)
Glucose-Capillary: 104 mg/dL — ABNORMAL HIGH (ref 70–99)
Glucose-Capillary: 136 mg/dL — ABNORMAL HIGH (ref 70–99)

## 2013-07-08 LAB — PROTIME-INR: INR: 2.23 — ABNORMAL HIGH (ref 0.00–1.49)

## 2013-07-08 MED ORDER — COLCHICINE 0.6 MG PO TABS: 0.6000 mg | ORAL_TABLET | Freq: Every day | ORAL | Status: AC

## 2013-07-08 MED ORDER — WARFARIN SODIUM 5 MG PO TABS
5.0000 mg | ORAL_TABLET | Freq: Once | ORAL | Status: AC
  Administered 2013-07-08: 5 mg via ORAL
  Filled 2013-07-08: qty 1

## 2013-07-08 NOTE — Progress Notes (Signed)
ANTICOAGULATION CONSULT NOTE - Follow Up Consult  Pharmacy Consult for Coumadin Indication: afib  Allergies  Allergen Reactions  . Gabapentin Other (See Comments)    May cause tremors and shaking    Patient Measurements: Height: 5\' 9"  (175.3 cm) Weight: 193 lb 9 oz (87.8 kg) IBW/kg (Calculated) : 70.7 Heparin Dosing Weight:   Vital Signs: Temp: 97.6 F (36.4 C) (12/02 0500) Temp src: Oral (12/02 0500) BP: 125/70 mmHg (12/02 0903) Pulse Rate: 74 (12/02 0903)  Labs:  Recent Labs  07/06/13 0555 07/07/13 0750 07/08/13 0609  HGB 9.8*  --   --   HCT 27.0*  --   --   PLT 198  --   --   LABPROT 20.5* 20.9* 24.0*  INR 1.82* 1.86* 2.23*  CREATININE 1.53* 1.77*  --     Estimated Creatinine Clearance: 40.7 ml/min (by C-G formula based on Cr of 1.77).   Assessment: AMS/fever, likely 2/2 gout. Fever/UTI/hematuria  Anticoagulation: Afib. Coumadin 3.75 mg daily except 1.875 mg MonFri at home for Afib with admit INR 1.69. (Perhaps we can get rid of the quarter tab dose at discharge, perhaps 4mg  and 2 mg). INR is therapeutic today at 2.23 . No CBC today. Per urology, there is some hematuria which is likely due to UTI or malignancy or other causes.    Goal of Therapy:  INR 2-3 Monitor platelets by anticoagulation protocol: Yes   Plan:  1) Coumadin 5 mg po x 1 again tonight. 2) F/U INR in AM 3) Monitor CBC and s/s of bleeding   Vinnie Level, PharmD.  Clinical Pharmacist Pager [phone]

## 2013-07-08 NOTE — Discharge Summary (Signed)
Physician Discharge Summary  Patient ID: Jared Landry MRN: [id number] DOB/AGE: [birth year] 72 y.o.  Admit date: 07/04/2013 Discharge date: 07/08/2013  Admission Diagnoses:  Discharge Diagnoses:  Principal Problem:   Hematuria-resolved; cT scan- mild thickening of the bladder wall; urology consulted- outpatient cystoscopy. Dr Linus Orn Acute gout flareup Chronic leg edema Acute on chronic renal failure Deconditioning/weakness Hypothyroidism- Synthroid dose adjusted Active Problems:   DIABETES MELLITUS, TYPE II   History ofTOBACCO USER   LBBB   SYSTOLIC HEART FAILURE, CHRONIC   COPD Anemia   Atrial fibrillation   Biventricular implantable cardioverter-defibrillator in situ   Long term (current) use of anticoagulants   Acute gouty arthritis   Discharged Condition: good  Hospital Course: 72 year old male admitted with  fever hematuria,left knee and ankle pain Problem #1: history of gout- with acute  Gout flareup; uric acid level was normal; continue on allopurinol Started on prednisone taper; colchicine- improvement in the pain swelling; x-rays negative. Taper prednisone; continue colchicine once daily Problem #2: fever; leukocytosis; urinalysis positive- started on Rocephin IV; cultures came back negative. Antibody discontinued; WBC count normal Problem #3: hematuria: urology consulted; consider outpatient cystocopy; CT of the abdomen pelvic- showed mild thickened bladder wall. History of BPH. Hematuria cleared Problem #4: chronic leg edema: Lasix was once a day; increase to b.i.d. With improvement in leg edema, consider TED hose stocking. problem #5: hypothyroidism; TSH was 10.4- Synthroid dose increased to 50 mcg; repeat thyroid function in one month; patient on amiodarone Problem #6: chronic biventricular heart failure with AICD; clinically stable Problem #7: atrial fibrillation rate control with beta blocker normal sinus rhythm; continue amiodarone; anticoagulation Coumadin  INR therapeutic. Problem #8 acute on chronic renal failure creatinine baseline 1.5-1.7;monitor blood chemistries. Currently euvolemic. Problem #9: deconditioning with  Weakness; physical therapy recommended short-term rehabilitation. Problem #10: COPD continue albuterol MDI and Advair. P.r.n. Problem #11: diabetes blood sugars stable continue  Januvia 50 mg daily.   Consults: None  Significant Diagnostic Studies: labs: creatinine 1.7, INR 2.2, potassium 3.9, hemoglobin 9.8, WBC 7.8, TSH 10.3, uric acid 5.6, urine analysis positive, microbiology: urine culture: negative and radiology: CXR: cardiomegaly, X-Ray: x-rray of the foot negative and CT scan: small bilateral pleural effusionnss, small ascites, bladder wall thickened  Treatments: antibiotics: ceftriaxone, anticoagulation: warfarin and steroids: prednisone  Discharge Exam: Blood pressure 106/65, pulse 78, temperature 97.6 F (36.4 C), temperature source Oral, resp. rate 18, height 5\' 9"  (1.753 m), weight 87.8 kg (193 lb 9 oz), SpO2 96.00%. General appearance: alert Resp: clear to auscultation bilaterally Cardio: regular rate and rhythm Extremities: edema 2+  Disposition: short-term rehabilitation  Discharge Orders   Future Appointments Provider Department Dept Phone   07/11/2013 9:50 AM [facility] [place] Office [phone]   07/11/2013 10:15 AM Duke Salvia, MD [facility] [place] Office [phone]   09/16/2013 10:00 AM Donato Schultz, MD [facility] [phone]   Future Orders Complete By Expires   Diet - low sodium heart healthy  As directed    Discharge instructions  As directed    Comments:     Check PT/ INR on 07/10/13- adjust  Coumadin Need BMET check in 1 week. Ted hose stocking to both legs   Increase activity slowly  As directed        Medication List         albuterol 108 (90 BASE) MCG/ACT inhaler  Commonly known as:  PROVENTIL HFA;VENTOLIN HFA  Inhale 2  puffs into the lungs every 6 (  six) hours as needed. For shortness of breath.     allopurinol 300 MG tablet  Commonly known as:  ZYLOPRIM  Take 300 mg by mouth daily.     allopurinol 100 MG tablet  Commonly known as:  ZYLOPRIM  Take 100 mg by mouth daily.     amiodarone 200 MG tablet  Commonly known as:  PACERONE  Take 1 tablet (200 mg total) by mouth daily.     aspirin EC 81 MG tablet  Take 81 mg by mouth daily.     Astaxanthin 4 MG Caps  Take 1 capsule by mouth daily.     atorvastatin 10 MG tablet  Commonly known as:  LIPITOR  Take 5 mg by mouth daily.     b complex vitamins tablet  Take 1 tablet by mouth every other day. Alternate with Zinc     cholecalciferol 1000 UNITS tablet  Commonly known as:  VITAMIN D  Take 2,000 Units by mouth every other day.     Co Q-10 100 MG Caps  Take 1 tablet by mouth daily. Alternate with Magnesium     colchicine 0.6 MG tablet  Take 1 tablet (0.6 mg total) by mouth daily.     Fluticasone-Salmeterol 100-50 MCG/DOSE Aepb  Commonly known as:  ADVAIR  Inhale 1 puff into the lungs 2 (two) times daily as needed.     furosemide 40 MG tablet  Commonly known as:  LASIX  Take 1 tablet (40 mg total) by mouth 2 (two) times daily.     levothyroxine 50 MCG tablet  Commonly known as:  SYNTHROID, LEVOTHROID  Take 1 tablet (50 mcg total) by mouth daily before breakfast.     Magnesium 300 MG Caps  Take 1 capsule by mouth every other day.     metoprolol succinate 100 MG 24 hr tablet  Commonly known as:  TOPROL-XL  Take 100 mg by mouth daily. Take with or immediately following a meal.     predniSONE 10 MG tablet  Commonly known as:  DELTASONE  2 tablet for 2 days, then 1 tablet for 2 days, the stop     sitaGLIPtin 50 MG tablet  Commonly known as:  JANUVIA  Take 1 tablet (50 mg total) by mouth daily.     warfarin 4 MG tablet  Commonly known as:  COUMADIN  1 tablet ( 4 mg) daily; except 1/2 tablet ( 2 mg) on mondays and fridays     Zinc  100 MG Tabs  Take 1 tablet by mouth every other day. Alternate with B Complex           Follow-up Information   Call Milford Cage, MD. (Call when you get out of the hospital for an appointment in 2-3 weeks.)    Specialty:  Urology   Contact information:   [address] [place] [address] [place] [address] [phone]     discharge planning time total: 45 minutes  Signed: Sorayah Schrodt 07/08/2013, 8:11 AM

## 2013-07-08 NOTE — Progress Notes (Signed)
Pt being discharged to [facility]. Ambulance called by CSW. PIV removed. Belongings returned to patient.   Jared Landry, [place]

## 2013-07-11 ENCOUNTER — Encounter: Payer: Medicare Other | Admitting: Internal Medicine

## 2013-07-15 ENCOUNTER — Ambulatory Visit (INDEPENDENT_AMBULATORY_CARE_PROVIDER_SITE_OTHER): Payer: Medicare Other | Admitting: Cardiology

## 2013-07-15 ENCOUNTER — Encounter: Payer: Self-pay | Admitting: Cardiology

## 2013-07-15 VITALS — BP 110/70 | HR 72 | Wt 196.0 lb

## 2013-07-15 DIAGNOSIS — I2589 Other forms of chronic ischemic heart disease: Secondary | ICD-10-CM

## 2013-07-15 DIAGNOSIS — I498 Other specified cardiac arrhythmias: Secondary | ICD-10-CM

## 2013-07-15 DIAGNOSIS — I5022 Chronic systolic (congestive) heart failure: Secondary | ICD-10-CM

## 2013-07-15 DIAGNOSIS — I255 Ischemic cardiomyopathy: Secondary | ICD-10-CM

## 2013-07-15 DIAGNOSIS — I471 Supraventricular tachycardia: Secondary | ICD-10-CM

## 2013-07-15 DIAGNOSIS — Z95810 Presence of automatic (implantable) cardiac defibrillator: Secondary | ICD-10-CM

## 2013-07-15 DIAGNOSIS — I4891 Unspecified atrial fibrillation: Secondary | ICD-10-CM

## 2013-07-15 DIAGNOSIS — I472 Ventricular tachycardia: Secondary | ICD-10-CM

## 2013-07-15 DIAGNOSIS — I2510 Atherosclerotic heart disease of native coronary artery without angina pectoris: Secondary | ICD-10-CM

## 2013-07-15 NOTE — Patient Instructions (Addendum)
Your Physician recommends for you to have a Chest XRAY 07/16/14 at [facility]   Your physician recommends that you schedule a follow-up appointment 07/18/14 at 11:30am with Dominga Ferry  Your physician recommends that you continue on your current medications as directed. Please refer to the Current Medication list given to you today.

## 2013-07-16 ENCOUNTER — Ambulatory Visit (INDEPENDENT_AMBULATORY_CARE_PROVIDER_SITE_OTHER)
Admission: RE | Admit: 2013-07-16 | Discharge: 2013-07-16 | Disposition: A | Discharge status: Home / Self Care | Payer: Medicare Other | Source: Ambulatory Visit | Attending: Cardiology | Admitting: Cardiology

## 2013-07-16 DIAGNOSIS — Z95810 Presence of automatic (implantable) cardiac defibrillator: Secondary | ICD-10-CM

## 2013-07-16 DIAGNOSIS — I5022 Chronic systolic (congestive) heart failure: Secondary | ICD-10-CM

## 2013-07-17 LAB — MDC_IDC_ENUM_SESS_TYPE_INCLINIC
Brady Statistic RA Percent Paced: 15 %
Brady Statistic RV Percent Paced: 97 %
HighPow Impedance: 51.75 Ohm
Implantable Pulse Generator Serial Number: 604440
Lead Channel Impedance Value: 337.5 Ohm
Lead Channel Impedance Value: 437.5 Ohm
Lead Channel Pacing Threshold Amplitude: 1 V
Lead Channel Pacing Threshold Amplitude: 1 V
Lead Channel Pacing Threshold Amplitude: 1.25 V
Lead Channel Pacing Threshold Amplitude: 4.25 V
Lead Channel Pacing Threshold Amplitude: 4.25 V
Lead Channel Pacing Threshold Pulse Width: 0.5 ms
Lead Channel Pacing Threshold Pulse Width: 0.5 ms
Lead Channel Pacing Threshold Pulse Width: 0.5 ms
Lead Channel Pacing Threshold Pulse Width: 1.2 ms
Lead Channel Pacing Threshold Pulse Width: 1.2 ms
Lead Channel Pacing Threshold Pulse Width: 1.2 ms
Lead Channel Pacing Threshold Pulse Width: 1.5 ms
Lead Channel Sensing Intrinsic Amplitude: 0.3 mV
Lead Channel Sensing Intrinsic Amplitude: 11.4 mV
Lead Channel Setting Pacing Amplitude: 2.5 V
Lead Channel Setting Pacing Amplitude: 2.5 V
Lead Channel Setting Pacing Pulse Width: 1.2 ms
Lead Channel Setting Sensing Sensitivity: 0.5 mV
Zone Setting Detection Interval: 335 ms

## 2013-07-17 NOTE — Progress Notes (Signed)
ELECTROPHYSIOLOGY OFFICE NOTE  Patient ID: [name] MRN: [id number], DOB/AGE: Sep 27, 1940   Date of Visit: 07/17/2013  Primary Physician: Georgann Housekeeper, MD Primary Cardiologist / EP: Anne Fu, MD / Graciela Husbands, MD Reason for Visit: EP/device follow-up  History of Present Illness  Jared Landry is a very pleasant 72 year old man with an ischemic CM and chronic systolic HF s/p CRT-D, paroxysmal VT, PAF, CAD and COPD who presents today for routine EP follow-up.    He is s/p DCCV in Dec 2012 for Afib/flutter in the context of amiodarone having been loaded. He reverted within hours. It wasn't clear at that time how symptomatic his atrial fibrillation was; however, when he saw Dr. Graciela Husbands in Jan 2013 he remained quite symptomatic from a HF standpoint. He was not able to climb stairs to his office without having significant DOE. He was also having increased LE edema. He was sent for cardiopulmonary stress testing where it was noted that he lost resynchronization at heart rates above 80. We then scheduled him for stress testing/GXT at our office which was done April 2013 with reprogramming of his device.  He presents today and reports chronic DOE but denies worsening from baseline. He denies chest pain or shortness of breath at rest. He denies palpitations, dizziness, near syncope or syncope. He denies increased LE swelling. He denies orthopnea, PND or recent weight gain. He denies ICD shocks. He continues to have difficulty with balance and gait instability for which he sees Dr. Marjory Lies at [facility]. Mr. Schlitt states that he is compliant and tolerating medications without difficulty.  Past Medical History Past Medical History  Diagnosis Date  . Atrial fibrillation     Cardioversion 07/20/11  transiently <3h successful  . CAD (coronary artery disease)     s/p drug eluting stent  . Diabetes mellitus     type II  . Hypertension   . Gout   . Osteoarthritis   . COPD (chronic obstructive  pulmonary disease)   . 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 Jude  . Ventricular tachycardia     terminated by the ICD  . Chronic systolic heart failure     Past Surgical History Past Surgical History  Procedure Laterality Date  . Appendectomy    . Tonsillectomy    . Vasectomy    . Surgical i &d after brown recluse inner thigh    . Cardiac defibrillator placement      St. Jude   . Cardioversion  07/20/2011    Procedure: CARDIOVERSION;  Surgeon: Donato Schultz, MD;  Location: [facility] OR;  Service: Cardiovascular;  Laterality: N/A;  . Cardioversion  01/18/2012    Procedure: CARDIOVERSION;  Surgeon: Donato Schultz, MD;  Location: [facility] OR;  Service: Cardiovascular;  Laterality: N/A;    Allergies/Intolerances Allergies  Allergen Reactions  . Gabapentin Other (See Comments)    May cause tremors and shaking    Current Home Medications Current Outpatient Prescriptions  Medication Sig Dispense Refill  . albuterol (PROVENTIL HFA;VENTOLIN HFA) 108 (90 BASE) MCG/ACT inhaler Inhale 2 puffs into the lungs every 6 (six) hours as needed. For shortness of breath.      . allopurinol (ZYLOPRIM) 100 MG tablet Take 100 mg by mouth daily.      Marland Kitchen allopurinol (ZYLOPRIM) 300 MG tablet Take 300 mg by mouth daily.      Marland Kitchen amiodarone (PACERONE) 200 MG tablet Take 1 tablet (200 mg total) by mouth daily.  30 tablet  12  .  aspirin EC 81 MG tablet Take 81 mg by mouth daily.      . Astaxanthin 4 MG CAPS Take 1 capsule by mouth daily.      Marland Kitchen atorvastatin (LIPITOR) 10 MG tablet Take 5 mg by mouth daily.       Marland Kitchen b complex vitamins tablet Take 1 tablet by mouth every other day. Alternate with Zinc      . cholecalciferol (VITAMIN D) 1000 UNITS tablet Take 2,000 Units by mouth every other day.       . Coenzyme Q10 (CO Q-10) 100 MG CAPS Take 1 tablet by mouth daily. Alternate with Magnesium      . colchicine 0.6 MG tablet Take 1 tablet (0.6 mg total) by mouth daily.  30 tablet  3  .  Fluticasone-Salmeterol (ADVAIR) 100-50 MCG/DOSE AEPB Inhale 1 puff into the lungs 2 (two) times daily as needed.       . furosemide (LASIX) 40 MG tablet Take 1 tablet (40 mg total) by mouth 2 (two) times daily.  30 tablet  3  . levothyroxine (SYNTHROID, LEVOTHROID) 50 MCG tablet Take 1 tablet (50 mcg total) by mouth daily before breakfast.  30 tablet  3  . Magnesium 300 MG CAPS Take 1 capsule by mouth every other day.       . metoprolol succinate (TOPROL-XL) 100 MG 24 hr tablet Take 100 mg by mouth daily. Take with or immediately following a meal.      . predniSONE (DELTASONE) 10 MG tablet 2 tablet for 2 days, then 1 tablet for 2 days, the stop  6 tablet  0  . sitaGLIPtin (JANUVIA) 50 MG tablet Take 1 tablet (50 mg total) by mouth daily.  30 tablet  3  . warfarin (COUMADIN) 4 MG tablet 1 tablet ( 4 mg) daily; except 1/2 tablet ( 2 mg) on mondays and fridays  30 tablet  3  . Zinc 100 MG TABS Take 1 tablet by mouth every other day. Alternate with B Complex       Social History Social History  . Marital Status: Married   Social History Main Topics  . Smoking status: Former Smoker -- 0.30 packs/day    Types: Cigarettes    Quit date: 10/29/2006  . Smokeless tobacco: Not on file  . Alcohol Use: 16.8 oz/week    28 Shots of liquor per week  . Drug Use: No   Review of Systems General: No chills, fever, night sweats or weight changes Cardiovascular: No chest pain, dyspnea on exertion, edema, orthopnea, palpitations, paroxysmal nocturnal dyspnea Dermatological: No rash, lesions or masses Respiratory: No cough, dyspnea Urologic: No hematuria, dysuria Abdominal: No nausea, vomiting, diarrhea, bright red blood per rectum, melena, or hematemesis Neurologic: No visual changes, weakness, changes in mental status All other systems reviewed and are otherwise negative except as noted above.  Physical Exam Vitals: Blood pressure 110/70, pulse 72, weight 196 lb (88.905 kg).  General: Well developed,  chronically ill appearing 71 y.o. male in no acute distress. HEENT: Normocephalic, atraumatic. EOMs intact. Sclera nonicteric. Oropharynx clear.  Neck: Supple. No JVD. Lungs: Respirations regular and unlabored, CTA bilaterally. No wheezes, rales or rhonchi. Heart: S1, S2 present. No murmurs, rub, S3 or S4. Abdomen: Soft, non-tender, non-distended. BS present x 4 quadrants. No hepatosplenomegaly.  Extremities: No clubbing, cyanosis or edema. DP/PT/Radials 2+ and equal bilaterally. Psych: Normal affect. Neuro: Alert and oriented X 3. Moves all extremities spontaneously.   Diagnostics Device interrogation today - Normal BiV ICD function. Thresholds  and sensing consistent with previous device measurements. Lead impedance trends stable over time. Mode switched 84% of time. Known h/o AFib and AT. No ventricular arrhythmia episodes recorded. Patient bi-ventricularly pacing 97% of the time. LV threshold elevated using LV ring to RV coil. LV threshold better using LV tip. Per SK, will reprogram LV output to LV tip. Sent for CXR. No other changes made this session. Device programmed with appropriate safety margins. Heart failure diagnostics reviewed and trends are stable for patient. Estimated longevity after LV output adjusted now 12 months.    Assessment and Plan 1. Ischemic CM s/p CRT-D implant  - LV threshold elevated using LV ring to RV coil (4.25V @ 1.5 msec) - LV threshold better using LV tip (1.25V @ 1.2 msec) - Per SK, will reprogram LV output to LV tip - Ordered CXR - No other programming changes made this session - Return to clinic for follow-up with me or Dr. Graciela Husbands in 2 weeks 2. Chronic systolic HF - stable - continue medical therapy 3. PAF and PAT - mode switched 87% of the time  - continue amiodarone and BB as previously directed by Dr. Graciela Husbands  - continue warfarin for stroke prevention 4. Paroxysmal VT  - stable; no evidence of ventricular arrhythmias on interrogation today  -  continue amiodarone and BB  5. CAD  - stable without anginal symptoms  - continue medical therapy  - keep scheduled follow-up with Dr. Anne Fu  Signed, Avey Mcmanamon, PA-C 07/17/2013, 1:33 PM

## 2013-07-18 ENCOUNTER — Encounter (HOSPITAL_COMMUNITY): Payer: Self-pay | Admitting: Emergency Medicine

## 2013-07-18 ENCOUNTER — Emergency Department (HOSPITAL_COMMUNITY): Payer: Medicare Other

## 2013-07-18 ENCOUNTER — Inpatient Hospital Stay (HOSPITAL_COMMUNITY)
Admission: EM | Admit: 2013-07-18 | Discharge: 2013-07-24 | DRG: 871 | Disposition: A | Discharge status: Skilled Nursing Facility | Payer: Medicare Other | Attending: Internal Medicine | Admitting: Internal Medicine

## 2013-07-18 ENCOUNTER — Encounter: Payer: Self-pay | Admitting: Internal Medicine

## 2013-07-18 ENCOUNTER — Ambulatory Visit: Payer: Medicare Other | Admitting: Cardiology

## 2013-07-18 DIAGNOSIS — I5022 Chronic systolic (congestive) heart failure: Secondary | ICD-10-CM

## 2013-07-18 DIAGNOSIS — I2589 Other forms of chronic ischemic heart disease: Secondary | ICD-10-CM | POA: Diagnosis present

## 2013-07-18 DIAGNOSIS — N189 Chronic kidney disease, unspecified: Secondary | ICD-10-CM

## 2013-07-18 DIAGNOSIS — J4489 Other specified chronic obstructive pulmonary disease: Secondary | ICD-10-CM | POA: Diagnosis present

## 2013-07-18 DIAGNOSIS — M109 Gout, unspecified: Secondary | ICD-10-CM | POA: Diagnosis present

## 2013-07-18 DIAGNOSIS — I255 Ischemic cardiomyopathy: Secondary | ICD-10-CM

## 2013-07-18 DIAGNOSIS — I129 Hypertensive chronic kidney disease with stage 1 through stage 4 chronic kidney disease, or unspecified chronic kidney disease: Secondary | ICD-10-CM | POA: Diagnosis present

## 2013-07-18 DIAGNOSIS — I4891 Unspecified atrial fibrillation: Secondary | ICD-10-CM | POA: Diagnosis present

## 2013-07-18 DIAGNOSIS — Z7982 Long term (current) use of aspirin: Secondary | ICD-10-CM

## 2013-07-18 DIAGNOSIS — I2510 Atherosclerotic heart disease of native coronary artery without angina pectoris: Secondary | ICD-10-CM | POA: Diagnosis present

## 2013-07-18 DIAGNOSIS — E1149 Type 2 diabetes mellitus with other diabetic neurological complication: Secondary | ICD-10-CM | POA: Diagnosis present

## 2013-07-18 DIAGNOSIS — M1990 Unspecified osteoarthritis, unspecified site: Secondary | ICD-10-CM | POA: Diagnosis present

## 2013-07-18 DIAGNOSIS — B952 Enterococcus as the cause of diseases classified elsewhere: Secondary | ICD-10-CM

## 2013-07-18 DIAGNOSIS — I5023 Acute on chronic systolic (congestive) heart failure: Secondary | ICD-10-CM | POA: Diagnosis present

## 2013-07-18 DIAGNOSIS — N179 Acute kidney failure, unspecified: Secondary | ICD-10-CM | POA: Diagnosis present

## 2013-07-18 DIAGNOSIS — Z95810 Presence of automatic (implantable) cardiac defibrillator: Secondary | ICD-10-CM | POA: Diagnosis present

## 2013-07-18 DIAGNOSIS — N390 Urinary tract infection, site not specified: Secondary | ICD-10-CM | POA: Diagnosis present

## 2013-07-18 DIAGNOSIS — Z833 Family history of diabetes mellitus: Secondary | ICD-10-CM | POA: Diagnosis present

## 2013-07-18 DIAGNOSIS — R509 Fever, unspecified: Secondary | ICD-10-CM

## 2013-07-18 DIAGNOSIS — A409 Streptococcal sepsis, unspecified: Principal | ICD-10-CM | POA: Diagnosis present

## 2013-07-18 DIAGNOSIS — Z87891 Personal history of nicotine dependence: Secondary | ICD-10-CM | POA: Diagnosis present

## 2013-07-18 DIAGNOSIS — R5381 Other malaise: Secondary | ICD-10-CM | POA: Diagnosis present

## 2013-07-18 DIAGNOSIS — E039 Hypothyroidism, unspecified: Secondary | ICD-10-CM | POA: Diagnosis present

## 2013-07-18 DIAGNOSIS — J449 Chronic obstructive pulmonary disease, unspecified: Secondary | ICD-10-CM | POA: Diagnosis present

## 2013-07-18 DIAGNOSIS — G9341 Metabolic encephalopathy: Secondary | ICD-10-CM | POA: Diagnosis present

## 2013-07-18 DIAGNOSIS — A419 Sepsis, unspecified organism: Secondary | ICD-10-CM | POA: Diagnosis present

## 2013-07-18 DIAGNOSIS — Z8249 Family history of ischemic heart disease and other diseases of the circulatory system: Secondary | ICD-10-CM | POA: Diagnosis present

## 2013-07-18 DIAGNOSIS — E861 Hypovolemia: Secondary | ICD-10-CM | POA: Diagnosis present

## 2013-07-18 DIAGNOSIS — Z79899 Other long term (current) drug therapy: Secondary | ICD-10-CM

## 2013-07-18 DIAGNOSIS — E860 Dehydration: Secondary | ICD-10-CM | POA: Diagnosis present

## 2013-07-18 DIAGNOSIS — E1142 Type 2 diabetes mellitus with diabetic polyneuropathy: Secondary | ICD-10-CM | POA: Diagnosis present

## 2013-07-18 DIAGNOSIS — N5089 Other specified disorders of the male genital organs: Secondary | ICD-10-CM | POA: Diagnosis not present

## 2013-07-18 DIAGNOSIS — N183 Chronic kidney disease, stage 3 unspecified: Secondary | ICD-10-CM | POA: Diagnosis present

## 2013-07-18 DIAGNOSIS — R4182 Altered mental status, unspecified: Secondary | ICD-10-CM | POA: Diagnosis present

## 2013-07-18 DIAGNOSIS — R269 Unspecified abnormalities of gait and mobility: Secondary | ICD-10-CM | POA: Diagnosis present

## 2013-07-18 DIAGNOSIS — I509 Heart failure, unspecified: Secondary | ICD-10-CM | POA: Diagnosis present

## 2013-07-18 DIAGNOSIS — Z7901 Long term (current) use of anticoagulants: Secondary | ICD-10-CM | POA: Diagnosis present

## 2013-07-18 DIAGNOSIS — D72829 Elevated white blood cell count, unspecified: Secondary | ICD-10-CM

## 2013-07-18 HISTORY — DX: Heart failure, unspecified: I50.9

## 2013-07-18 HISTORY — DX: Presence of automatic (implantable) cardiac defibrillator: Z95.810

## 2013-07-18 HISTORY — DX: Myoneural disorder, unspecified: G70.9

## 2013-07-18 HISTORY — DX: Shortness of breath: R06.02

## 2013-07-18 LAB — CBC WITH DIFFERENTIAL/PLATELET
Basophils Relative: 0 % (ref 0–1)
Eosinophils Absolute: 0 10*3/uL (ref 0.0–0.7)
Eosinophils Relative: 0 % (ref 0–5)
Hemoglobin: 9.5 g/dL — ABNORMAL LOW (ref 13.0–17.0)
Lymphs Abs: 0.8 10*3/uL (ref 0.7–4.0)
MCH: 32.6 pg (ref 26.0–34.0)
MCHC: 35.4 g/dL (ref 30.0–36.0)
Monocytes Absolute: 1.7 10*3/uL — ABNORMAL HIGH (ref 0.1–1.0)
Monocytes Relative: 9 % (ref 3–12)
Neutrophils Relative %: 87 % — ABNORMAL HIGH (ref 43–77)

## 2013-07-18 LAB — COMPREHENSIVE METABOLIC PANEL
ALT: 79 U/L — ABNORMAL HIGH (ref 0–53)
AST: 105 U/L — ABNORMAL HIGH (ref 0–37)
Alkaline Phosphatase: 157 U/L — ABNORMAL HIGH (ref 39–117)
CO2: 26 mEq/L (ref 19–32)
Chloride: 96 mEq/L (ref 96–112)
Creatinine, Ser: 2.16 mg/dL — ABNORMAL HIGH (ref 0.50–1.35)
GFR calc non Af Amer: 29 mL/min — ABNORMAL LOW (ref >90)
Potassium: 4 mEq/L (ref 3.5–5.1)
Sodium: 134 mEq/L — ABNORMAL LOW (ref 135–145)
Total Bilirubin: 1.4 mg/dL — ABNORMAL HIGH (ref 0.3–1.2)

## 2013-07-18 LAB — CG4 I-STAT (LACTIC ACID): Lactic Acid, Venous: 1.55 mmol/L (ref 0.5–2.2)

## 2013-07-18 MED ORDER — SODIUM CHLORIDE 0.9 % IV BOLUS (SEPSIS)
500.0000 mL | Freq: Once | INTRAVENOUS | Status: AC
Start: 2013-07-18 — End: 2013-07-19
  Administered 2013-07-18: 500 mL via INTRAVENOUS

## 2013-07-18 MED ORDER — DEXTROSE 5 % IV SOLN
1.0000 g | Freq: Once | INTRAVENOUS | Status: AC
  Administered 2013-07-18: 1 g via INTRAVENOUS
  Filled 2013-07-18: qty 10

## 2013-07-18 MED ORDER — SODIUM CHLORIDE 0.9 % IV BOLUS (SEPSIS)
500.0000 mL | Freq: Once | INTRAVENOUS | Status: AC
  Administered 2013-07-18: 500 mL via INTRAVENOUS

## 2013-07-18 MED ORDER — ACETAMINOPHEN 325 MG PO TABS: 650.0000 mg | ORAL_TABLET | Freq: Four times a day (QID) | ORAL | Status: DC | PRN

## 2013-07-18 NOTE — ED Notes (Signed)
Per EMS, patient coming from [facility] rehab. Patient was d/c from here with urosepsis and the staff reports that the patient has felt worse over the last 2 weeks and today patient had a temperature spike of 101.5 today. Patients BP per EMS 85/50 and HR is 78 paced. EMS gave of fluid with no change in BP. Patient has hx of CHF.

## 2013-07-18 NOTE — ED Notes (Signed)
MD notified of Patient BP 82/48

## 2013-07-19 ENCOUNTER — Inpatient Hospital Stay (HOSPITAL_COMMUNITY): Payer: Medicare Other

## 2013-07-19 ENCOUNTER — Encounter (HOSPITAL_COMMUNITY): Payer: Self-pay | Admitting: Internal Medicine

## 2013-07-19 DIAGNOSIS — I5022 Chronic systolic (congestive) heart failure: Secondary | ICD-10-CM

## 2013-07-19 DIAGNOSIS — I4891 Unspecified atrial fibrillation: Secondary | ICD-10-CM

## 2013-07-19 DIAGNOSIS — A419 Sepsis, unspecified organism: Secondary | ICD-10-CM

## 2013-07-19 DIAGNOSIS — Z95810 Presence of automatic (implantable) cardiac defibrillator: Secondary | ICD-10-CM

## 2013-07-19 DIAGNOSIS — N289 Disorder of kidney and ureter, unspecified: Secondary | ICD-10-CM

## 2013-07-19 DIAGNOSIS — I059 Rheumatic mitral valve disease, unspecified: Secondary | ICD-10-CM

## 2013-07-19 DIAGNOSIS — R4182 Altered mental status, unspecified: Secondary | ICD-10-CM | POA: Diagnosis present

## 2013-07-19 DIAGNOSIS — Z7901 Long term (current) use of anticoagulants: Secondary | ICD-10-CM

## 2013-07-19 DIAGNOSIS — I5023 Acute on chronic systolic (congestive) heart failure: Secondary | ICD-10-CM

## 2013-07-19 DIAGNOSIS — N189 Chronic kidney disease, unspecified: Secondary | ICD-10-CM

## 2013-07-19 LAB — BLOOD GAS, ARTERIAL
Acid-Base Excess: 1.6 mmol/L (ref 0.0–2.0)
Bicarbonate: 24.8 mEq/L — ABNORMAL HIGH (ref 20.0–24.0)
Drawn by: 330991
O2 Content: 2 L/min
O2 Saturation: 94.5 %
Patient temperature: 100.9
TCO2: 25.8 mmol/L (ref 0–100)
pH, Arterial: 7.47 — ABNORMAL HIGH (ref 7.350–7.450)

## 2013-07-19 LAB — BASIC METABOLIC PANEL
BUN: 50 mg/dL — ABNORMAL HIGH (ref 6–23)
CO2: 26 mEq/L (ref 19–32)
CO2: 25 mEq/L (ref 19–32)
Calcium: 8.9 mg/dL (ref 8.4–10.5)
Calcium: 8.7 mg/dL (ref 8.4–10.5)
Chloride: 101 mEq/L (ref 96–112)
Creatinine, Ser: 2.12 mg/dL — ABNORMAL HIGH (ref 0.50–1.35)
Creatinine, Ser: 2.08 mg/dL — ABNORMAL HIGH (ref 0.50–1.35)
GFR calc Af Amer: 35 mL/min — ABNORMAL LOW (ref >90)
GFR calc non Af Amer: 30 mL/min — ABNORMAL LOW (ref >90)
Glucose, Bld: 100 mg/dL — ABNORMAL HIGH (ref 70–99)
Glucose, Bld: 92 mg/dL (ref 70–99)
Potassium: 4.1 mEq/L (ref 3.5–5.1)
Sodium: 137 mEq/L (ref 135–145)
Sodium: 135 mEq/L (ref 135–145)

## 2013-07-19 LAB — URINALYSIS, ROUTINE W REFLEX MICROSCOPIC
Glucose, UA: NEGATIVE mg/dL
Protein, ur: 100 mg/dL — AB
Urobilinogen, UA: 0.2 mg/dL (ref 0.0–1.0)

## 2013-07-19 LAB — HEPATIC FUNCTION PANEL
AST: 110 U/L — ABNORMAL HIGH (ref 0–37)
Albumin: 3 g/dL — ABNORMAL LOW (ref 3.5–5.2)
Alkaline Phosphatase: 173 U/L — ABNORMAL HIGH (ref 39–117)
Bilirubin, Direct: 0.7 mg/dL — ABNORMAL HIGH (ref 0.0–0.3)
Total Bilirubin: 1.7 mg/dL — ABNORMAL HIGH (ref 0.3–1.2)

## 2013-07-19 LAB — PRO B NATRIURETIC PEPTIDE: Pro B Natriuretic peptide (BNP): 20313 pg/mL — ABNORMAL HIGH (ref 0–125)

## 2013-07-19 LAB — URINE MICROSCOPIC-ADD ON

## 2013-07-19 LAB — CREATININE, URINE, RANDOM: Creatinine, Urine: 92.93 mg/dL

## 2013-07-19 LAB — CK TOTAL AND CKMB (NOT AT ARMC)
Relative Index: 0.4 (ref 0.0–2.5)
Total CK: 717 U/L — ABNORMAL HIGH (ref 7–232)

## 2013-07-19 LAB — CBC
Hemoglobin: 10.6 g/dL — ABNORMAL LOW (ref 13.0–17.0)
MCH: 32.6 pg (ref 26.0–34.0)
MCV: 92 fL (ref 78.0–100.0)
RBC: 3.25 MIL/uL — ABNORMAL LOW (ref 4.22–5.81)
WBC: 17.7 10*3/uL — ABNORMAL HIGH (ref 4.0–10.5)

## 2013-07-19 LAB — MRSA PCR SCREENING: MRSA by PCR: NEGATIVE

## 2013-07-19 LAB — TSH: TSH: 3.311 u[IU]/mL (ref 0.350–4.500)

## 2013-07-19 LAB — GLUCOSE, CAPILLARY: Glucose-Capillary: 95 mg/dL (ref 70–99)

## 2013-07-19 LAB — MAGNESIUM: Magnesium: 2.2 mg/dL (ref 1.5–2.5)

## 2013-07-19 LAB — SODIUM, URINE, RANDOM: Sodium, Ur: <10 mEq/L

## 2013-07-19 MED ORDER — ALBUTEROL SULFATE (5 MG/ML) 0.5% IN NEBU
2.5000 mg | INHALATION_SOLUTION | Freq: Four times a day (QID) | RESPIRATORY_TRACT | Status: DC
  Administered 2013-07-19 – 2013-07-21 (×7): 2.5 mg via RESPIRATORY_TRACT
  Filled 2013-07-19 (×8): qty 0.5

## 2013-07-19 MED ORDER — IPRATROPIUM BROMIDE 0.02 % IN SOLN
0.5000 mg | Freq: Four times a day (QID) | RESPIRATORY_TRACT | Status: DC
  Administered 2013-07-19 – 2013-07-21 (×7): 0.5 mg via RESPIRATORY_TRACT
  Filled 2013-07-19 (×8): qty 2.5

## 2013-07-19 MED ORDER — ALBUTEROL SULFATE (5 MG/ML) 0.5% IN NEBU
2.5000 mg | INHALATION_SOLUTION | RESPIRATORY_TRACT | Status: DC | PRN
  Administered 2013-07-22 – 2013-07-23 (×3): 2.5 mg via RESPIRATORY_TRACT
  Filled 2013-07-19 (×3): qty 0.5

## 2013-07-19 MED ORDER — SODIUM CHLORIDE 0.9 % IV BOLUS (SEPSIS)
500.0000 mL | Freq: Once | INTRAVENOUS | Status: AC
  Administered 2013-07-19: 500 mL via INTRAVENOUS

## 2013-07-19 MED ORDER — ALLOPURINOL 300 MG PO TABS
300.0000 mg | ORAL_TABLET | Freq: Every day | ORAL | Status: DC
Start: 2013-07-19 — End: 2013-07-24
  Administered 2013-07-19 – 2013-07-24 (×6): 300 mg via ORAL
  Filled 2013-07-19 (×6): qty 1

## 2013-07-19 MED ORDER — MOMETASONE FURO-FORMOTEROL FUM 100-5 MCG/ACT IN AERO
2.0000 | INHALATION_SPRAY | Freq: Two times a day (BID) | RESPIRATORY_TRACT | Status: DC
  Administered 2013-07-19: 2 via RESPIRATORY_TRACT
  Filled 2013-07-19: qty 8.8

## 2013-07-19 MED ORDER — AMIODARONE HCL 200 MG PO TABS
200.0000 mg | ORAL_TABLET | Freq: Every day | ORAL | Status: DC
  Administered 2013-07-19 – 2013-07-24 (×6): 200 mg via ORAL
  Filled 2013-07-19 (×6): qty 1

## 2013-07-19 MED ORDER — ASPIRIN EC 81 MG PO TBEC
81.0000 mg | DELAYED_RELEASE_TABLET | Freq: Every day | ORAL | Status: DC
  Administered 2013-07-19 – 2013-07-24 (×6): 81 mg via ORAL
  Filled 2013-07-19 (×6): qty 1

## 2013-07-19 MED ORDER — WARFARIN SODIUM 5 MG PO TABS
5.0000 mg | ORAL_TABLET | Freq: Once | ORAL | Status: AC
  Administered 2013-07-19: 5 mg via ORAL
  Filled 2013-07-19: qty 1

## 2013-07-19 MED ORDER — SODIUM CHLORIDE 0.9 % IV SOLN
2000.0000 mg | Freq: Once | INTRAVENOUS | Status: AC
  Administered 2013-07-19: 2000 mg via INTRAVENOUS
  Filled 2013-07-19: qty 2000

## 2013-07-19 MED ORDER — WARFARIN SODIUM 6 MG PO TABS
6.0000 mg | ORAL_TABLET | Freq: Once | ORAL | Status: DC
  Filled 2013-07-19: qty 1

## 2013-07-19 MED ORDER — SODIUM CHLORIDE 0.9 % IV BOLUS (SEPSIS)
1000.0000 mL | Freq: Once | INTRAVENOUS | Status: AC
  Administered 2013-07-19: 1000 mL via INTRAVENOUS

## 2013-07-19 MED ORDER — SODIUM CHLORIDE 0.9 % IV SOLN
1250.0000 mg | INTRAVENOUS | Status: DC
  Administered 2013-07-20 – 2013-07-21 (×2): 1250 mg via INTRAVENOUS
  Filled 2013-07-19 (×2): qty 1250

## 2013-07-19 MED ORDER — WARFARIN - PHARMACIST DOSING INPATIENT
Freq: Every day | Status: DC
  Administered 2013-07-20: 18:00:00

## 2013-07-19 MED ORDER — LEVOTHYROXINE SODIUM 50 MCG PO TABS
50.0000 ug | ORAL_TABLET | Freq: Every day | ORAL | Status: DC
  Administered 2013-07-19 – 2013-07-24 (×6): 50 ug via ORAL
  Filled 2013-07-19 (×7): qty 1

## 2013-07-19 MED ORDER — ATORVASTATIN CALCIUM 10 MG PO TABS
5.0000 mg | ORAL_TABLET | Freq: Every day | ORAL | Status: DC
  Administered 2013-07-19 – 2013-07-24 (×6): 5 mg via ORAL
  Filled 2013-07-19 (×6): qty 0.5

## 2013-07-19 MED ORDER — DEXTROSE 5 % IV SOLN
2.0000 g | INTRAVENOUS | Status: DC
  Administered 2013-07-20 – 2013-07-21 (×2): 2 g via INTRAVENOUS
  Filled 2013-07-19 (×3): qty 2

## 2013-07-19 MED ORDER — POLYETHYLENE GLYCOL 3350 17 G PO PACK
17.0000 g | PACK | Freq: Every day | ORAL | Status: DC
  Administered 2013-07-19 – 2013-07-22 (×4): 17 g via ORAL
  Filled 2013-07-19 (×5): qty 1

## 2013-07-19 MED ORDER — PIPERACILLIN-TAZOBACTAM 3.375 G IVPB 30 MIN
3.3750 g | Freq: Once | INTRAVENOUS | Status: AC
  Administered 2013-07-19: 3.375 g via INTRAVENOUS
  Filled 2013-07-19: qty 50

## 2013-07-19 MED ORDER — SODIUM CHLORIDE 0.9 % IV SOLN: 250.0000 mL | INTRAVENOUS | Status: DC | PRN

## 2013-07-19 MED ORDER — COLCHICINE 0.6 MG PO TABS
0.6000 mg | ORAL_TABLET | Freq: Every day | ORAL | Status: DC
  Filled 2013-07-19: qty 1

## 2013-07-19 NOTE — H&P (Signed)
PULMONARY  / CRITICAL CARE MEDICINE HISTORY AND PHYSICAL EXAMINATION  Name: Jared Landry MRN: [id number] DOB: June 14, 1941    ADMISSION DATE:  07/18/2013  CHIEF COMPLAINT:  Altered mental status  BRIEF PATIENT DESCRIPTION: 72 year old man with history of chronic systolic heart failure and recent admission for possible UTI, presenting from SNF with concerns for decreased urine output and altered mental status.   LINES / TUBES: 1. Right upper extremity PIV 2. Foley  CULTURES: 1. Blood culture pending 2. Urine culture pending  ANTIBIOTICS: 1. S/p Vanc, ceftriaxone, zosyn 2. Continuing vanc, cefepime  HISTORY OF PRESENT ILLNESS:  Jared Landry is a 72 year old man with a history of difficult-to-control Afib, systolic CHF (EF ranging 30-45%), and CAD, who presents with altered mental status from a SNF, where he has been since his recent admission at the end of November here for a similar presentation.  During his last admission, there was concern for a UTI, but his urine culture suggested mixed flora and was not processed.  He did have hematuria and a CT scan with enlarged prostate and a thickened bladder wall; the plan was for outpatient followup to address this.  Since D/C, he has done relatively well, but his wife noticed this AM that he was not acting normal.  She visited him and said he was very sleepy, not very attentive, felt feverish, and had very low urine output.  She was frightened and asked for him to be brought back to the ED.  Here, his blood pressures were low, with systolics in the 80s and diastolics in the 40s, and he was given antibiotics and gentle fluids. He has had some mild abdominal pain per his wife, but he denies this.  He has not had a cough, shortness of breath, chest pain, rash, head ache, neck pain, or diarrhea.  His urine has been dark, but not grossly bloody.  His legs continue to be swollen, though currently at his baseline.     PAST MEDICAL HISTORY :  Past Medical  History  Diagnosis Date  . Atrial fibrillation     Cardioversion 07/20/11  transiently <3h successful  . CAD (coronary artery disease)     s/p drug eluting stent  . Diabetes mellitus     type II  . Hypertension   . Gout   . Osteoarthritis   . COPD (chronic obstructive pulmonary disease)   . 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 Jude  . Ventricular tachycardia     terminated by the ICD  . Chronic systolic heart failure     Past Surgical History  Procedure Laterality Date  . Appendectomy    . Tonsillectomy    . Vasectomy    . Surgical i &d after brown recluse inner thigh    . Cardiac defibrillator placement      St. Jude   . Cardioversion  07/20/2011    Procedure: CARDIOVERSION;  Surgeon: Jared Schultz, MD;  Location: [facility] OR;  Service: Cardiovascular;  Laterality: N/A;  . Cardioversion  01/18/2012    Procedure: CARDIOVERSION;  Surgeon: Jared Schultz, MD;  Location: [facility] OR;  Service: Cardiovascular;  Laterality: N/A;    Prior to Admission medications   Medication Sig Start Date End Date Taking? Authorizing Provider  albuterol (PROVENTIL HFA;VENTOLIN HFA) 108 (90 BASE) MCG/ACT inhaler Inhale 2 puffs into the lungs every 6 (six) hours as needed. For shortness of breath.   Yes Historical Provider, MD  allopurinol (ZYLOPRIM) 300  MG tablet Take 300 mg by mouth daily.   Yes Historical Provider, MD  amiodarone (PACERONE) 200 MG tablet Take 1 tablet (200 mg total) by mouth daily. 07/07/13 07/07/14 Yes Georgann Housekeeper, MD  aspirin EC 81 MG tablet Take 81 mg by mouth daily.   Yes Historical Provider, MD  Astaxanthin 4 MG CAPS Take 1 capsule by mouth daily.   Yes Historical Provider, MD  atorvastatin (LIPITOR) 10 MG tablet Take 5 mg by mouth daily.    Yes Historical Provider, MD  b complex vitamins tablet Take 1 tablet by mouth every other day. Alternate with Zinc   Yes Historical Provider, MD  cholecalciferol (VITAMIN D) 1000 UNITS tablet Take 2,000 Units by mouth  every other day.    Yes Historical Provider, MD  Coenzyme Q10 (CO Q-10) 100 MG CAPS Take 1 tablet by mouth daily. Alternate with Magnesium   Yes Historical Provider, MD  colchicine 0.6 MG tablet Take 1 tablet (0.6 mg total) by mouth daily. 07/08/13  Yes Georgann Housekeeper, MD  Fluticasone-Salmeterol (ADVAIR) 100-50 MCG/DOSE AEPB Inhale 1 puff into the lungs 2 (two) times daily as needed.    Yes Historical Provider, MD  furosemide (LASIX) 40 MG tablet Take 1 tablet (40 mg total) by mouth 2 (two) times daily. 07/07/13  Yes Georgann Housekeeper, MD  levothyroxine (SYNTHROID, LEVOTHROID) 50 MCG tablet Take 1 tablet (50 mcg total) by mouth daily before breakfast. 07/07/13  Yes Georgann Housekeeper, MD  Magnesium 300 MG CAPS Take 1 capsule by mouth every other day.    Yes Historical Provider, MD  metoprolol succinate (TOPROL-XL) 100 MG 24 hr tablet Take 100 mg by mouth daily. Take with or immediately following a meal.   Yes Historical Provider, MD  warfarin (COUMADIN) 4 MG tablet 1 tablet ( 4 mg) daily; except 1/2 tablet ( 2 mg) on mondays and fridays 07/07/13  Yes Georgann Housekeeper, MD  Zinc 100 MG TABS Take 1 tablet by mouth every other day. Alternate with B Complex   Yes Historical Provider, MD    Allergies  Allergen Reactions  . Gabapentin Other (See Comments)    May cause tremors and shaking    FAMILY HISTORY:  Family History  Problem Relation Age of Onset  . Heart failure Mother   . Diabetes type II Sister   . Appendicitis Father     rupture    SOCIAL HISTORY:  reports that he quit smoking about 6 years ago. His smoking use included Cigarettes. He smoked 0.30 packs per day. He does not have any smokeless tobacco history on file. He reports that he drinks about 16.8 ounces of alcohol per week. He reports that he does not use illicit drugs.  REVIEW OF SYSTEMS:  A full 14 point review of systems was performed, with pertinent positives and negatives as above in HPI  PHYSICAL EXAM  VITAL SIGNS: Temp:  [97.4 F  (36.3 C)-98.5 F (36.9 C)] 97.4 F (36.3 C) (12/13 0457) Pulse Rate:  [70-73] 73 (12/13 0530) Resp:  [11-24] 23 (12/13 0530) BP: (81-103)/(45-74) 102/59 mmHg (12/13 0530) SpO2:  [94 %-100 %] 100 % (12/13 0530) Weight:  [204 lb 12.9 oz (92.9 kg)] 204 lb 12.9 oz (92.9 kg) (12/13 0500)   INTAKE / OUTPUT: Intake/Output     12/12 0701 - 12/13 0700   I.V. (mL/kg) 1600 (17.2)   Total Intake(mL/kg) 1600 (17.2)   Urine (mL/kg/hr) 100   Total Output 100   Net +1500  PHYSICAL EXAMINATION: General:  Elderly man in no acute distress; appears stated age Neuro:  Follows commands but requires waking for each conversation and falls asleep easily during conversation.  No focal cranial nerve deficits.  Moves all extremities. Oriented to person, place, time, and situation.  HEENT:  Dry mucus membranes. PERRL. EOMi. Atraumatic, normocephalic Neck:  Trachea midline.  JVD not elevated. Lymph: no cervical, supraclavicular, submandibular lymphadenopathy.  Cardiovascular:  RRR, no MRG, S1, S2 normal. 1+ peripheral pulses.  Lungs:  Clear bilaterally; normal work of breathing. No wheezes or crackles Abdomen:  Soft, nontender, nondistended. No CVA tenderness bilaterally Musculoskeletal:  No evidence of active synovitis Skin:  No rashes, skin breakdown. Warm, dry, and well perfused.   LABS:  CBC Recent Labs     07/18/13  2218  WBC  20.0*  HGB  9.5*  HCT  26.8*  PLT  192    Coag's No results found for this basename: APTT, INR,  in the last 72 hours  BMET Recent Labs     07/18/13  2242  NA  134*  K  4.0  CL  96  CO2  26  BUN  49*  CREATININE  2.16*  GLUCOSE  111*    Electrolytes Recent Labs     07/18/13  2242  CALCIUM  8.6    Sepsis Markers No results found for this basename: LACTICACIDVEN, PROCALCITON, O2SATVEN,  in the last 72 hours  ABG No results found for this basename: PHART, PCO2ART, PO2ART,  in the last 72 hours  Liver Enzymes Recent Labs     07/18/13   2242  AST  105*  ALT  79*  ALKPHOS  157*  BILITOT  1.4*  ALBUMIN  2.8*    Cardiac Enzymes No results found for this basename: TROPONINI, PROBNP,  in the last 72 hours  Glucose Recent Labs     07/19/13  0452  GLUCAP  95    Imaging Dg Chest Port 1 View  07/18/2013   CLINICAL DATA:  Fever.  EXAM: PORTABLE CHEST - 1 VIEW  COMPARISON:  07/16/2013.  FINDINGS: Cardiomegaly. AICD. Mild vascular congestion without overt infiltrate or failure. Overall slight improved aeration. Clearing of bilateral pleural effusions. Calcified tortuous aorta. Negative osseous structures.  IMPRESSION: Cardiomegaly.  Slight improvement aeration.   Electronically Signed   By: Davonna Belling M.D.   On: 07/18/2013 22:55    EKG: AV paced, unchanged from prior CXR: Reviewed personally. Stable cardiomegaly. No effusions or infiltrates.   ASSESSMENT / PLAN: Active Problems:   Altered mental status   PULMONARY A/P: 1.  COPD perhistory  Change home advair to formulary [facility])  PRN albuterol nebs  CARDIOVASCULAR A/P:  1. Chronic Systolic CHF - weight is stable from last discharge, but lungs seem drier and patient clinically appears dry on exam, with no JVD and with dry mucus membranes  Holding beta blocker currently due to hypotension - WILL NEED TO BE RESTARTED WHEN APPROPRIATE  Does not qualify for ACE or ARB at this time due to AKI  BiV pacer recently interrogated and functioning well  Holding lasix while pt appears volume-down; WILL NEED TO BE RESTARTED WHEN APPROPRIATE  Check pro-BNP (prior baselines for comparison) 2.  Paroxysmal afib, s/p failed ablation  Continue home amiodarone.  However, if TSH still elevated, may need to consider amiodarone effect  As above, restart beta blocker when able  Continue aspirin  Holding coumadin until INR results come back, with moderate suspicion for supratherapeutic levels - WILL  NEED TO RESTART WHEN APPROPRIATE 3.   Hypotension of unclear etiology -  septic versus hypovolemic versus cardiogenic (less likely). First lactate is reassuring that he is not in a state of hypoperfusion.  Responded well to 2.5L of fluid thus far, with systolic BP coming up to 100s  Monitor BP without further boluses for now  Monitor lactate (pending) - if rising, consider arterial line and central line for CVP and central venous sat  RENAL A/P:  1. Acute Kidney Injury - Most likely due to hypovolemia in the setting of decreased PO intake and possible infection.  However, also concerned about post-obstructive process   Recheck creatinine after fluids  Check urine lytes (urine urea, Na, creatinine)  Check renal ultrasound  Hold colchicine   Caution with zosyn + vanc combo (change zosyn to cefepime)  GASTROINTESTINAL A/P:   Bowel regimen - miralax daily  INFECTIOUS A/P: 1. Possible sepsis - leukocytosis, low grade fevers per report, hypotension. Source unclear, however.  CXR without obvious pneumonia and no symptoms. U/A not impressive (LE and N neg) and has not grown a pathogen after multiple samples in recent past.  No wounds on exam. No sinus symptoms. Altered mental status is improving with fluids and patient has no meningismus or headache.   Blood, urine cultures pending  Given multiple recent hospitalizations, will cover broadly with Vancomycin and Cefepime  ENDOCRINE A/P: 1. Diabetes Type II  Sliding scale insulin 2.   Elevated TSH (10 last admission) - Recheck TSH.  If rising, could possibly explain some of altered mental status and edema.  Consider endocrine guidance on synthroid dosing and possible amiodarone effect  NEUROLOGIC A/P: 1. Altered Mental Status (decreased alertness)- Differential includes uremia, drug side effect (pt taking supplements), dehydration, urinary retention, infection, CNS infection, CVA.    Improving with hydration - will continue to monitor  No indication for LP or head CT at this time  Hold home  supplements, avoid benzos  BEST PRACTICE / DISPOSITION Level of Care:  ICU Consultants: none Code Status:  Full Diet:  NPO while decreased in alertness; advance as tolerated DVT Px:  Hold until INR results GI Px:  Not indicated Skin Integrity:  Monitor Social / Family:  Wife primary decision maker   TODAY'S SUMMARY: 72 y/o man admitted with decreased alertness and hypotension, with AKI on labs. Improving with supportive care, possibly due to gentle fluid replacement for dehydration in the setting of infection (unclear etiology) and decreased PO intake.   I have personally obtained a history, examined the patient, evaluated laboratory and imaging results, formulated the assessment and plan and placed orders.  CRITICAL CARE: The patient is critically ill with multiple organ systems failure and requires high complexity decision making for assessment and support, frequent evaluation and titration of therapies, application of advanced monitoring technologies and extensive interpretation of multiple databases. Critical Care Time devoted to patient care services described in this note is 90 minutes.   Pershing Cox, MD Pulmonary and Critical Care Medicine [facility] Pager: [phone]  07/19/2013, 5:39 AM

## 2013-07-19 NOTE — ED Provider Notes (Signed)
I saw and evaluated the patient, reviewed the resident's note and I agree with the findings and plan.  EKG Interpretation    Date/Time:  Friday July 18 2013 21:59:28 EST Ventricular Rate:  70 PR Interval:  45 QRS Duration: 181 QT Interval:  528 QTC Calculation: 570 R Axis:   -129 Text Interpretation:  Atrial-ventricular dual-paced rhythm No further analysis attempted due to paced rhythm No significant change was found Confirmed by [facility]  MD, TREY ([id number]) on 07/18/2013 10:55:58 PM          CRITICAL CARE Performed by: Warnell Forester   Total critical care time: 30  Critical care time was exclusive of separately billable procedures and treating other patients.  Critical care was necessary to treat or prevent imminent or life-threatening deterioration.  Critical care was time spent personally by me on the following activities: development of treatment plan with patient and/or surrogate as well as nursing, discussions with consultants, evaluation of patient's response to treatment, examination of patient, obtaining history from patient or surrogate, ordering and performing treatments and interventions, ordering and review of laboratory studies, ordering and review of radiographic studies, pulse oximetry and re-evaluation of patient's condition.   72 yo male w hx of CHF presenting with fevers, altered mental status, and hypotension.  He was given abx and IV fluid resuscitation.  On exam, alert, slightly confused, normal respiratory effort, abdomen soft and nontender.  With hx of CHF and peripheral edema, fluid resuscitation was approached with care.  His mental status improved, but blood pressures did not significantly improve in ED.  Critical care was consulted for further management.   Clinical Impression: 1. Septic shock   2. Leukocytosis   3. Urinary tract infection   4. Fever   5. Altered mental status        Candyce Churn, MD 07/19/13 [id number]

## 2013-07-19 NOTE — Progress Notes (Signed)
ANTIBIOTIC CONSULT NOTE - INITIAL  Pharmacy Consult for Vancomycin Indication: rule out sepsis  Allergies  Allergen Reactions  . Gabapentin Other (See Comments)    May cause tremors and shaking    Patient Measurements: Height: 5\' 8"  (172.7 cm) Weight: 204 lb 12.9 oz (92.9 kg) IBW/kg (Calculated) : 68.4  Vital Signs: Temp: 97.4 F (36.3 C) (12/13 0457) Temp src: Oral (12/13 0457) BP: 102/59 mmHg (12/13 0530) Pulse Rate: 73 (12/13 0530) Intake/Output from previous day: 12/12 0701 - 12/13 0700 In: 1600 [I.V.:1600] Out: 100 [Urine:100] Intake/Output from this shift: Total I/O In: 1600 [I.V.:1600] Out: 100 [Urine:100]  Labs:  Recent Labs  07/18/13 2218 07/18/13 2242  WBC 20.0*  --   HGB 9.5*  --   PLT 192  --   CREATININE  --  2.16*   Estimated Creatinine Clearance: 33.7 ml/min (by C-G formula based on Cr of 2.16).   Microbiology: Recent Results (from the past 720 hour(s))  URINE CULTURE     Status: None   Collection Time    07/05/13  1:58 AM      Result Value Range Status   Specimen Description URINE, RANDOM   Final   Special Requests NONE   Final   Culture  Setup Time     Final   Value: 07/05/2013 14:04     Performed at [facility] Count     Final   Value: 35,000 COLONIES/ML     Performed at [facility]   Culture     Final   Value: Multiple bacterial morphotypes present, none predominant. Suggest appropriate recollection if clinically indicated.     Performed at [facility]   Report Status 07/06/2013 FINAL   Final  URINE CULTURE     Status: None   Collection Time    07/05/13  5:59 AM      Result Value Range Status   Specimen Description URINE, CLEAN CATCH   Final   Special Requests NONE   Final   Culture  Setup Time     Final   Value: 07/05/2013 17:58     Performed at [facility] Count     Final   Value: 50,000 COLONIES/ML     Performed at [facility]   Culture     Final   Value:  Multiple bacterial morphotypes present, none predominant. Suggest appropriate recollection if clinically indicated.     Performed at [facility]   Report Status 07/07/2013 FINAL   Final    Medical History: Past Medical History  Diagnosis Date  . Atrial fibrillation     Cardioversion 07/20/11  transiently <3h successful  . CAD (coronary artery disease)     s/p drug eluting stent  . Diabetes mellitus     type II  . Hypertension   . Gout   . Osteoarthritis   . COPD (chronic obstructive pulmonary disease)   . 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 Jude  . Ventricular tachycardia     terminated by the ICD  . Chronic systolic heart failure    Assessment: 72 y/o M here with possible UTI, r/o sepsis. To start broad spectrum antibiotics. WBC 20, afebrile, Scr elevated with CrCl ~ 30, abnormal UA.   Vancomycin 2000 mg in the ED at 0152 Also got Zosyn/CTX in the ED>>to start Cefepime  Goal of Therapy:  Vancomycin trough level 15-20 mcg/ml  Plan:  -Vancomycin  1250 mg IV q24h 12/14 at 0600 -Cefepime renally adjusted to 2g IV q24h -Trend WBC, temp, renal function, cultures   Thank you for allowing me to take part in this patient's care,  Abran Duke, PharmD Clinical Pharmacist Phone: [phone] Pager: [phone] 07/19/2013 5:55 AM

## 2013-07-19 NOTE — Progress Notes (Signed)
PULMONARY  / CRITICAL CARE MEDICINE  Name: Jared Landry MRN: [id number] DOB: 03-Aug-1941    ADMISSION DATE:  07/18/2013  REFERRING MD :  EDP PRIMARY SERVICE: PCCM  CHIEF COMPLAINT:  AMS, hypotension   BRIEF PATIENT DESCRIPTION: 72 year old man with history of chronic systolic heart failure (EF30-45%), AFib and recent admission for possible UTI, presenting from SNF with concerns for decreased urine output and altered mental status.  Hypotensive in ER.  Admitted to PCCM with sepsis  SIGNIFICANT EVENTS / STUDIES:  12/13 renal ultrasound>>>b/l non obstructing medullary calculi 12/13 Echo>>>mild LVH, EF 30 to 35%, diffuse hypokinesis, mild MR, severe LA dilation, mod RA dilation  LINES / TUBES: PIV  CULTURES: BC x 2 12/13>>> Urine 12/13>>>   ANTIBIOTICS: Vancomycin 12/13>>> Cefepime 12/13>>>  SUBJECTIVE:  C/o chills, malaise.     VITAL SIGNS: Temp:  [97.4 F (36.3 C)-98.5 F (36.9 C)] 97.6 F (36.4 C) (12/13 0745) Pulse Rate:  [70-79] 73 (12/13 0730) Resp:  [11-24] 16 (12/13 0730) BP: (81-111)/(45-78) 111/70 mmHg (12/13 0730) SpO2:  [94 %-100 %] 100 % (12/13 0910) Weight:  [204 lb 12.9 oz (92.9 kg)] 204 lb 12.9 oz (92.9 kg) (12/13 0500) HEMODYNAMICS:   VENTILATOR SETTINGS:   INTAKE / OUTPUT: Intake/Output     12/12 0701 - 12/13 0700 12/13 0701 - 12/14 0700   I.V. (mL/kg) 1600 (17.2)    Total Intake(mL/kg) 1600 (17.2)    Urine (mL/kg/hr) 160    Total Output 160     Net +1440            PHYSICAL EXAMINATION: General:  Chronically ill appearing male, very uncomfortable appearing  Neuro:  Awake, alert, appropriate, MAE HEENT:  Mm moist, no JVD Cardiovascular:  s1s2 rrr, paced  Lungs:  resps even non labored, clear  Abdomen:  Soft, non tender, +bs, +CVA tenderness  Musculoskeletal:  Warm and dry, 1-2+ symetric BLE edema, warm, erythematous, skin mottled, PVD  LABS:  CBC  Recent Labs Lab 07/18/13 2218 07/19/13 0810  WBC 20.0* 17.7*  HGB 9.5* 10.6*   HCT 26.8* 29.9*  PLT 192 192   Coag's  Recent Labs Lab 07/19/13 0810  INR 1.57*   BMET  Recent Labs Lab 07/18/13 2242 07/19/13 0810  NA 134* 137  K 4.0 4.3  CL 96 101  CO2 26 26  BUN 49* 50*  CREATININE 2.16* 2.12*  GLUCOSE 111* 100*   Electrolytes  Recent Labs Lab 07/18/13 2242 07/19/13 0810  CALCIUM 8.6 8.9  MG  --  2.2  PHOS  --  4.7*   Sepsis Markers  Recent Labs Lab 07/18/13 2244 07/19/13 0805  LATICACIDVEN 1.55 1.3   ABG No results found for this basename: PHART, PCO2ART, PO2ART,  in the last 168 hours  Liver Enzymes  Recent Labs Lab 07/18/13 2242 07/19/13 0810  AST 105* 110*  ALT 79* 82*  ALKPHOS 157* 173*  BILITOT 1.4* 1.7*  ALBUMIN 2.8* 3.0*   Cardiac Enzymes No results found for this basename: TROPONINI, PROBNP,  in the last 168 hours Glucose  Recent Labs Lab 07/19/13 0452  GLUCAP 95    Lab Results  Component Value Date   TSH 10.308* 07/05/2013    Imaging Dg Chest Port 1 View  07/18/2013   CLINICAL DATA:  Fever.  EXAM: PORTABLE CHEST - 1 VIEW  COMPARISON:  07/16/2013.  FINDINGS: Cardiomegaly. AICD. Mild vascular congestion without overt infiltrate or failure. Overall slight improved aeration. Clearing of bilateral pleural effusions. Calcified tortuous aorta. Negative  osseous structures.  IMPRESSION: Cardiomegaly.  Slight improvement aeration.   Electronically Signed   By: Davonna Belling M.D.   On: 07/18/2013 22:55    ASSESSMENT / PLAN:  PULMONARY Hx COPD  P:   Albuterol neb while in hospital  CARDIOVASCULAR Chronic systolic CHF  PAF - s/p failed ablation  SIRS/Hypotension - much improved with volume. Lactate reassuring. Now hypertensive.  P:  Continue hold home B blocker, lasix Continue home amiodarone  Resume coumadin - inr subtherapeutic  Continue asa  F/u bnp   KVO fluids  Check TSH -- previously 10.308 on 07/05/13 BLE venous dopplers   RENAL Acute renal insuffiencey - in setting SIRS/hypotension with non  obstructing kidney stones and hx BPH P:   F/u chem  Continue foley - UOP ok   GASTROINTESTINAL No acute issue  P:   PRN miralax   HEMATOLOGIC No acute issue  P:  F/u cbc   INFECTIOUS SIRS/Sepsis -- source unclear.  Suspect urinary source but u/a with neg leuks and neg nitrites.  Many bacteria but has not grown a pathogen on multiple recent cultures.  CXR clear. No other s/s or c/o.   P:   Continue broad spectrum abx  Pan culture pending  Assess renal ultrasound  ?other potential source   ENDOCRINE Hypothyroid - last TSH >10 P:   Recheck TSH  Continue synthroid   NEUROLOGIC AMS - resolved.  P:   Supportive care     [facility], NP 07/19/2013  9:46 AM Pager: [phone] or [phone]   Reviewed above, examined pt, and agree with assessment/plan.  Concern for sepsis, but ? Source >> likely urine.  Continue current Abx pending culture results.  Has hx of systolic heart failure and Echo now with EF 30% (previously documented to be 50% from Echo in 2012).  Will ask cardiology to assess.  CC time 35 minutes.  Coralyn Helling, MD [facility] Pulmonary/Critical Care 07/19/2013, 2:24 PM Pager:  [phone] After 3pm call: [phone]

## 2013-07-19 NOTE — ED Provider Notes (Signed)
CSN: [id number]     Arrival date & time 07/18/13  2154 History   First MD Initiated Contact with Patient 07/18/13 2157     Chief Complaint  Patient presents with  . Code Sepsis   (Consider location/radiation/quality/duration/timing/severity/associated sxs/prior Treatment) HPI Jared Landry is a 72 y.o. male who presents to the emergency department for concern of possible UTI.  Patient recently had UTI and was discharged to a rehab facility.  Since discharge to the facility, he has been doing well, but today he was a little more sleepy and slightly confused.  Wife noted very foul smelling urine and was concerned as this was how he presented when he had his last UTI.  Vital signs taken and he was found to have temperature to 102 and blood pressure of 80/50.  EMS called.  Patient given 200cc enroute of fluid.    Past Medical History  Diagnosis Date  . Atrial fibrillation     Cardioversion 07/20/11  transiently <3h successful  . CAD (coronary artery disease)     s/p drug eluting stent  . Diabetes mellitus     type II  . Hypertension   . Gout   . Osteoarthritis   . COPD (chronic obstructive pulmonary disease)   . 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 Jude  . Ventricular tachycardia     terminated by the ICD  . Chronic systolic heart failure    Past Surgical History  Procedure Laterality Date  . Appendectomy    . Tonsillectomy    . Vasectomy    . Surgical i &d after brown recluse inner thigh    . Cardiac defibrillator placement      St. Jude   . Cardioversion  07/20/2011    Procedure: CARDIOVERSION;  Surgeon: Donato Schultz, MD;  Location: [facility] OR;  Service: Cardiovascular;  Laterality: N/A;  . Cardioversion  01/18/2012    Procedure: CARDIOVERSION;  Surgeon: Donato Schultz, MD;  Location: [facility] OR;  Service: Cardiovascular;  Laterality: N/A;   Family History  Problem Relation Age of Onset  . Heart failure Mother   . Diabetes type II Sister   .  Appendicitis Father     rupture   History  Substance Use Topics  . Smoking status: Former Smoker -- 0.30 packs/day    Types: Cigarettes    Quit date: 10/29/2006  . Smokeless tobacco: Not on file  . Alcohol Use: 16.8 oz/week    28 Shots of liquor per week    Review of Systems  Constitutional: Negative for fever and chills.  HENT: Negative for congestion and sore throat.   Respiratory: Negative for cough.   Gastrointestinal: Negative for nausea, vomiting, abdominal pain, diarrhea and constipation.  Endocrine: Negative for polyuria.  Genitourinary: Negative for dysuria and hematuria.  Musculoskeletal: Negative for neck pain.  Skin: Negative for rash.  Neurological: Negative for headaches.  Psychiatric/Behavioral: Negative.   All other systems reviewed and are negative.    Allergies  Gabapentin  Home Medications   Current Outpatient Rx  Name  Route  Sig  Dispense  Refill  . albuterol (PROVENTIL HFA;VENTOLIN HFA) 108 (90 BASE) MCG/ACT inhaler   Inhalation   Inhale 2 puffs into the lungs every 6 (six) hours as needed. For shortness of breath.         . allopurinol (ZYLOPRIM) 100 MG tablet   Oral   Take 100 mg by mouth daily.         Marland Kitchen allopurinol (  ZYLOPRIM) 300 MG tablet   Oral   Take 300 mg by mouth daily.         Marland Kitchen amiodarone (PACERONE) 200 MG tablet   Oral   Take 1 tablet (200 mg total) by mouth daily.   30 tablet   12   . aspirin EC 81 MG tablet   Oral   Take 81 mg by mouth daily.         . Astaxanthin 4 MG CAPS   Oral   Take 1 capsule by mouth daily.         Marland Kitchen atorvastatin (LIPITOR) 10 MG tablet   Oral   Take 5 mg by mouth daily.          Marland Kitchen b complex vitamins tablet   Oral   Take 1 tablet by mouth every other day. Alternate with Zinc         . cholecalciferol (VITAMIN D) 1000 UNITS tablet   Oral   Take 2,000 Units by mouth every other day.          . Coenzyme Q10 (CO Q-10) 100 MG CAPS   Oral   Take 1 tablet by mouth daily.  Alternate with Magnesium         . colchicine 0.6 MG tablet   Oral   Take 1 tablet (0.6 mg total) by mouth daily.   30 tablet   3   . Fluticasone-Salmeterol (ADVAIR) 100-50 MCG/DOSE AEPB   Inhalation   Inhale 1 puff into the lungs 2 (two) times daily as needed.          . furosemide (LASIX) 40 MG tablet   Oral   Take 1 tablet (40 mg total) by mouth 2 (two) times daily.   30 tablet   3   . levothyroxine (SYNTHROID, LEVOTHROID) 50 MCG tablet   Oral   Take 1 tablet (50 mcg total) by mouth daily before breakfast.   30 tablet   3   . Magnesium 300 MG CAPS   Oral   Take 1 capsule by mouth every other day.          . metoprolol succinate (TOPROL-XL) 100 MG 24 hr tablet   Oral   Take 100 mg by mouth daily. Take with or immediately following a meal.         . warfarin (COUMADIN) 4 MG tablet      1 tablet ( 4 mg) daily; except 1/2 tablet ( 2 mg) on mondays and fridays   30 tablet   3   . Zinc 100 MG TABS   Oral   Take 1 tablet by mouth every other day. Alternate with B Complex          BP 81/46  Pulse 72  Temp(Src) 98.4 F (36.9 C) (Oral)  Resp 13  SpO2 99% Physical Exam  Nursing note and vitals reviewed. Constitutional: He is oriented to person, place, and time. He appears well-developed and well-nourished. No distress.  HENT:  Head: Normocephalic and atraumatic.  Right Ear: External ear normal.  Left Ear: External ear normal.  Mouth/Throat: Oropharynx is clear and moist. No oropharyngeal exudate.  Eyes: Conjunctivae are normal. Pupils are equal, round, and reactive to light. Right eye exhibits no discharge.  Neck: Normal range of motion. Neck supple. No tracheal deviation present.  Cardiovascular: Normal rate, regular rhythm and intact distal pulses.   Pulmonary/Chest: Effort normal. No respiratory distress. He has no wheezes. He has no rales.  Abdominal: Soft. He  exhibits no distension. There is no tenderness. There is no rebound and no guarding.   Musculoskeletal: Normal range of motion. He exhibits edema (2+ symmetric).  Neurological: He is alert and oriented to person, place, and time. GCS eye subscore is 4. GCS verbal subscore is 5. GCS motor subscore is 6.  Skin: Skin is warm and dry. No rash noted. He is not diaphoretic.  Psychiatric: He has a normal mood and affect.    ED Course  Procedures (including critical care time) Labs Review Labs Reviewed  CBC WITH DIFFERENTIAL - Abnormal; Notable for the following:    WBC 20.0 (*)    RBC 2.91 (*)    Hemoglobin 9.5 (*)    HCT 26.8 (*)    RDW 16.0 (*)    Neutrophils Relative % 87 (*)    Neutro Abs 17.4 (*)    Lymphocytes Relative 4 (*)    Monocytes Absolute 1.7 (*)    All other components within normal limits  URINALYSIS, ROUTINE W REFLEX MICROSCOPIC - Abnormal; Notable for the following:    APPearance CLOUDY (*)    Hgb urine dipstick LARGE (*)    Bilirubin Urine SMALL (*)    Protein, ur 100 (*)    All other components within normal limits  COMPREHENSIVE METABOLIC PANEL - Abnormal; Notable for the following:    Sodium 134 (*)    Glucose, Bld 111 (*)    BUN 49 (*)    Creatinine, Ser 2.16 (*)    Total Protein 5.8 (*)    Albumin 2.8 (*)    AST 105 (*)    ALT 79 (*)    Alkaline Phosphatase 157 (*)    Total Bilirubin 1.4 (*)    GFR calc non Af Amer 29 (*)    GFR calc Af Amer 33 (*)    All other components within normal limits  URINE MICROSCOPIC-ADD ON - Abnormal; Notable for the following:    Bacteria, UA MANY (*)    All other components within normal limits  CULTURE, BLOOD (ROUTINE X 2)  CULTURE, BLOOD (ROUTINE X 2)  URINE CULTURE  CG4 I-STAT (LACTIC ACID)   Imaging Review Dg Chest Port 1 View  07/18/2013   CLINICAL DATA:  Fever.  EXAM: PORTABLE CHEST - 1 VIEW  COMPARISON:  07/16/2013.  FINDINGS: Cardiomegaly. AICD. Mild vascular congestion without overt infiltrate or failure. Overall slight improved aeration. Clearing of bilateral pleural effusions. Calcified  tortuous aorta. Negative osseous structures.  IMPRESSION: Cardiomegaly.  Slight improvement aeration.   Electronically Signed   By: Davonna Belling M.D.   On: 07/18/2013 22:55    EKG Interpretation    Date/Time:  Friday July 18 2013 21:59:28 EST Ventricular Rate:  70 PR Interval:  45 QRS Duration: 181 QT Interval:  528 QTC Calculation: 570 R Axis:   -129 Text Interpretation:  Atrial-ventricular dual-paced rhythm No further analysis attempted due to paced rhythm No significant change was found Confirmed by [facility]  MD, TREY ([id number]) on 07/18/2013 10:55:58 PM            MDM   1. Septic shock   2. Leukocytosis   3. Urinary tract infection   4. Fever   5. Altered mental status    Jared Landry is a 71 y.o. male who presents to the emergency department for concern of fever and AMS.  On arrival, patient febrile with BP of 80s/40s.  Code sepsis initiated upon arrival.  Rocephin initially administered for presumed urosepsis and then later  broadened to vanc/zosyn.  Abdomen benign.  CXR without pneumonia.  1500cc administered without resolution in BP.  Critical care consulted for admission.  Patient admitted.    Arloa Koh, MD 07/19/13 [phone]

## 2013-07-19 NOTE — ED Provider Notes (Signed)
I saw and evaluated the patient, reviewed the resident's note and I agree with the findings and plan.    Candyce Churn, MD 07/19/13 [phone]

## 2013-07-19 NOTE — Consult Note (Signed)
CARDIOLOGY CONSULT NOTE   Patient ID: Jared Landry MRN: [id number] DOB/AGE: 12-23-1940 72 y.o.  Admit Date: 07/18/2013 Referring Physician: CCM Primary Physician: Georgann Housekeeper, MD Consulting Cardiologist: Asiyah Pineau Primary Cardiologist Klein/Skains Reason for Consultation: A/C Systolic CHF/Atrial fib  Clinical Summary Jared Landry is a 72 y.o.male admitted from SNF with altered  mental status, and decreased urine output, with known history of chronic systolic CHF (EF of 30-35% per echo this admission), A-Fib on chronic coumadin therapy, CAD with DES to LAD in 10/2007, ICD Dual chamber pacemaker ([employer] placed 09/2009). He is now diagnosed with sepsis with urine and blood cultures pending and followed by CCM team. Pro- BNP is found to be elevated at 20,313. Creatinine 2.08. We are asked for assistance with CHF management.    He had recent admission for UTI in November 2014 with treatment for gout. He was treated with IV antibiotics, steroids, and colchicine.    He is being treated with vancomycin for sepsis. He is on lasix 40 mg po BID at SNF. Diuretics have not been started during this admission. He remains on amiodarone for rate control.  He continues mild confusion, but is oriented to place. He remains slightly short of breath. Denies chest pain.    Allergies  Allergen Reactions  . Gabapentin Other (See Comments)    May cause tremors and shaking    Medications Scheduled Medications: . ipratropium  0.5 mg Nebulization Q6H   And  . albuterol  2.5 mg Nebulization Q6H  . allopurinol  300 mg Oral Daily  . amiodarone  200 mg Oral Daily  . aspirin EC  81 mg Oral Daily  . atorvastatin  5 mg Oral Daily  . ceFEPime (MAXIPIME) IV  2 g Intravenous Q24H  . levothyroxine  50 mcg Oral QAC breakfast  . polyethylene glycol  17 g Oral Daily  . [START ON 07/20/2013] vancomycin  1,250 mg Intravenous Q24H  . warfarin  5 mg Oral ONCE-1800  . Warfarin - Pharmacist Dosing Inpatient   Does  not apply q1800        PRN Medications:  sodium chloride, acetaminophen, albuterol   Past Medical History  Diagnosis Date  . Atrial fibrillation     Cardioversion 07/20/11  transiently <3h successful  . CAD (coronary artery disease)     s/p drug eluting stent  . Diabetes mellitus     type II  . Hypertension   . Gout   . Osteoarthritis   . COPD (chronic obstructive pulmonary disease)   . 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  . Ventricular tachycardia     terminated by the ICD  . Chronic systolic heart failure     Past Surgical History  Procedure Laterality Date  . Appendectomy    . Tonsillectomy    . Vasectomy    . Surgical i &d after brown recluse inner thigh    . Cardiac defibrillator placement      [employer]   . Cardioversion  07/20/2011    Procedure: CARDIOVERSION;  Surgeon: Donato Schultz, MD;  Location: [facility] OR;  Service: Cardiovascular;  Laterality: N/A;  . Cardioversion  01/18/2012    Procedure: CARDIOVERSION;  Surgeon: Donato Schultz, MD;  Location: [facility] OR;  Service: Cardiovascular;  Laterality: N/A;    Family History  Problem Relation Age of Onset  . Heart failure Mother   . Diabetes type II Sister   . Appendicitis Father     rupture  Social History Jared Landry reports that he quit smoking about 6 years ago. His smoking use included Cigarettes. He smoked 0.30 packs per day. He does not have any smokeless tobacco history on file. Jared Landry reports that he drinks about 16.8 ounces of alcohol per week.  Review of Systems Otherwise reviewed and negative except as outlined.  Physical Examination Blood pressure 98/54, pulse 73, temperature 99.1 F (37.3 C), temperature source Oral, resp. rate 17, height 5\' 8"  (1.727 m), weight 204 lb 12.9 oz (92.9 kg), SpO2 98.00%.  Intake/Output Summary (Last 24 hours) at 07/19/13 1547 Last data filed at 07/19/13 1300  Gross per 24 hour  Intake   1740 ml  Output    405 ml  Net   1335 ml     Telemetry: Ventricular pacing.  HEENT: Conjunctiva and lids normal, oropharynx clear with moist mucosa. Neck: Supple, no elevated JVP or carotid bruits, no thyromegaly. Lungs: Diminished in the bases, Cardiac: Regular rate and rhythm, no S3 or significant systolic murmur, no pericardial rub. Abdomen: Soft, nontender, no hepatomegaly, bowel sounds present, no guarding or rebound. Extremities: 2+ pitting edema, distal pulses diminished.  Skin: Hot and dry. Musculoskeletal: No kyphosis. Neuropsychiatric: Alert and oriented x2, affect grossly appropriate.  Prior Cardiac Testing/Procedures  Lab Results  Basic Metabolic Panel:  Recent Labs Lab 07/18/13 2242 07/19/13 0810 07/19/13 1135  NA 134* 137 135  K 4.0 4.3 4.1  CL 96 101 98  CO2 26 26 25   GLUCOSE 111* 100* 92  BUN 49* 50* 50*  CREATININE 2.16* 2.12* 2.08*  CALCIUM 8.6 8.9 8.7  MG  --  2.2  --   PHOS  --  4.7*  --     Liver Function Tests:  Recent Labs Lab 07/18/13 2242 07/19/13 0810  AST 105* 110*  ALT 79* 82*  ALKPHOS 157* 173*  BILITOT 1.4* 1.7*  PROT 5.8* 6.6  ALBUMIN 2.8* 3.0*    CBC:  Recent Labs Lab 07/18/13 2218 07/19/13 0810  WBC 20.0* 17.7*  NEUTROABS 17.4*  --   HGB 9.5* 10.6*  HCT 26.8* 29.9*  MCV 92.1 92.0  PLT 192 192    Cardiac Enzymes:  Recent Labs Lab 07/19/13 0810  CKTOTAL 717*  CKMB 2.8  TROPONINI <0.30   Radiology: US Renal  07/19/2013   CLINICAL DATA:  Evaluate hydronephrosis  EXAM: RENAL/URINARY TRACT ULTRASOUND COMPLETE  COMPARISON:  None.  FINDINGS: Right Kidney:  Length: 10.7 cm. 4.8 mm nonobstructing medullary calculus lower pole. Otherwise appropriate corticomedullary differentiation without evidence of hydronephrosis, solid nor cystic masses.  Left Kidney:  Length: 10.7 cm. 6.9 mm calculus identified within the lower pole medullary portion. Otherwise no evidence of hydronephrosis. There is appropriate corticomedullary differentiation. A 2nd 6.9 mm calculus is  appreciated within the lower pole left kidney nonobstructing.  Bladder:  Appears normal for degree of bladder distention.  IMPRESSION: Bilateral nonobstructing medullary calculi. There is no evidence of obstructive uropathy further sonographic abnormalities.   Electronically Signed   By: Salome Holmes M.D.   On: 07/19/2013 11:29   Dg Chest Port 1 View  07/18/2013   CLINICAL DATA:  Fever.  EXAM: PORTABLE CHEST - 1 VIEW  COMPARISON:  07/16/2013.  FINDINGS: Cardiomegaly. AICD. Mild vascular congestion without overt infiltrate or failure. Overall slight improved aeration. Clearing of bilateral pleural effusions. Calcified tortuous aorta. Negative osseous structures.  IMPRESSION: Cardiomegaly.  Slight improvement aeration.   Electronically Signed   By: Davonna Belling M.D.   On: 07/18/2013 22:55  ECG: LV pacing  Impression and Recommendations  1. Acute on Chronic Systolic CHF: Pro-BNP significantly elevated at 20,313. He is not currently on IV lasix. Breath sounds are diminished with LEE.  Creatinine is elevated at 2.08. Echo completed on 07/19/2013 demonstrated reduced EF of 30%-35%. There was no evidence of vegetation. Consider TEE is this is indicated. If BC are positive will consider. Await results. Cautious use of IV fluids. No plans to start IV diuretics at this time. Will place TED Hose.  2.Atrial fib: Rate is controlled on amiodarone 200 mg daily. He continues on coumadin per pharmacy with INR 1.57.  There is no evidence of significant anemia.   3. Urosepsis: On vancomycin, rocephin, and zosyn.He remains febrile with T-max 100.4. He is hypotensive.  4. Diabetes: Continue treatment regimen. Consider PTH consultation.   Signed: Bettey Mare. Lyman Bishop NP [facility] 07/19/2013, 3:47 PM Co-Sign MD  I have seen, examined the patient, and reviewed the above assessment and plan.  Changes to above are made where necessary.  The patient is admitted with presumed urosepsis.  Blood cultures  remain negative thus far.  EF is depressed though on review, EF has been intermittently depressed since 2009 (EF 30% at that time).  He does not appear significantly volume overloaded presently.  His spouse reports that his BLE edema is actually improved.  I think that this is primarily due to venous insufficiency.  We will order support hose.  Diuresis is limited by hypotension and renal failure.  I would recommend that we keep Is and Os about even and avoid IVF as able.  We will need to interrogated his ICD on Monday.  Continue coumadin for h/o afib unless actively bleeding.  Cardiology will follow  Co Sign: Hillis Range, MD 07/19/2013 5:37 PM

## 2013-07-19 NOTE — ED Notes (Signed)
MD Thuet notified of MAP less than 65.

## 2013-07-19 NOTE — Progress Notes (Signed)
ANTICOAGULATION CONSULT NOTE - Initial Consult  Pharmacy Consult for Coumadin Indication: atrial fibrillation  Allergies  Allergen Reactions  . Gabapentin Other (See Comments)    May cause tremors and shaking    Patient Measurements: Height: 5\' 8"  (172.7 cm) Weight: 204 lb 12.9 oz (92.9 kg) IBW/kg (Calculated) : 68.4 Heparin Dosing Weight: n/a  Vital Signs: Temp: 97.6 F (36.4 C) (12/13 0745) Temp src: Oral (12/13 0745) BP: 152/115 mmHg (12/13 0945) Pulse Rate: 73 (12/13 0945)  Labs:  Recent Labs  07/18/13 2218 07/18/13 2242 07/19/13 0810  HGB 9.5*  --  10.6*  HCT 26.8*  --  29.9*  PLT 192  --  192  LABPROT  --   --  18.3*  INR  --   --  1.57*  CREATININE  --  2.16* 2.12*  CKTOTAL  --   --  717*  CKMB  --   --  2.8  TROPONINI  --   --  <0.30    Estimated Creatinine Clearance: 34.3 ml/min (by C-G formula based on Cr of 2.12).   Medical History: Past Medical History  Diagnosis Date  . Atrial fibrillation     Cardioversion 07/20/11  transiently <3h successful  . CAD (coronary artery disease)     s/p drug eluting stent  . Diabetes mellitus     type II  . Hypertension   . Gout   . Osteoarthritis   . COPD (chronic obstructive pulmonary disease)   . 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 Jude  . Ventricular tachycardia     terminated by the ICD  . Chronic systolic heart failure     Medications:  Scheduled:  . allopurinol  300 mg Oral Daily  . amiodarone  200 mg Oral Daily  . aspirin EC  81 mg Oral Daily  . atorvastatin  5 mg Oral Daily  . ceFEPime (MAXIPIME) IV  2 g Intravenous Q24H  . levothyroxine  50 mcg Oral QAC breakfast  . mometasone-formoterol  2 puff Inhalation BID  . polyethylene glycol  17 g Oral Daily  . [START ON 07/20/2013] vancomycin  1,250 mg Intravenous Q24H  . warfarin  6 mg Oral ONCE-1800  . Warfarin - Pharmacist Dosing Inpatient   Does not apply q1800    Assessment: 72 yo male on chronic  Coumadin for afib admitted for AMS and hypotension.  INR subtherapeutic today at 1.54.  Per SNF records,  PTA dose was formerly = 4 mg daily except 2 mg on Mon/Fridays.  SNF MAR hard to interpret, but I think this was changed to 3 mg daily on 12/5.  INR = 4.76 on 12/5 per SNF records, 3.47 on 12/8, 2.54 on 12/9 and 1.84 on 12/11.  Unsure exactly how these INR changes correlate to his dosing. Spoke to wife, she said morning of admission he had a nosebleed.  When last seen at [facility] on 06/23/13 his dose was 2.5 mg daily except 1.25 mg on Mon/Fri.  Do not believe he was on amiodarone at that time.  Goal of Therapy:  INR 2-3 Monitor platelets by anticoagulation protocol: Yes   Plan:  1. Will give Coumadin 5 mg po x 1 today. 2. F/u AM INR.  Tad Moore, BCPS  Clinical Pharmacist Pager [phone]  07/19/2013 11:15 AM

## 2013-07-19 NOTE — Progress Notes (Signed)
Echocardiogram 2D Echocardiogram has been performed.  Jared Landry 07/19/2013, 11:51 AM

## 2013-07-20 DIAGNOSIS — J449 Chronic obstructive pulmonary disease, unspecified: Secondary | ICD-10-CM

## 2013-07-20 DIAGNOSIS — M7989 Other specified soft tissue disorders: Secondary | ICD-10-CM

## 2013-07-20 DIAGNOSIS — R509 Fever, unspecified: Secondary | ICD-10-CM

## 2013-07-20 DIAGNOSIS — A419 Sepsis, unspecified organism: Secondary | ICD-10-CM

## 2013-07-20 DIAGNOSIS — J4489 Other specified chronic obstructive pulmonary disease: Secondary | ICD-10-CM

## 2013-07-20 DIAGNOSIS — I5022 Chronic systolic (congestive) heart failure: Secondary | ICD-10-CM

## 2013-07-20 DIAGNOSIS — I2589 Other forms of chronic ischemic heart disease: Secondary | ICD-10-CM

## 2013-07-20 DIAGNOSIS — N390 Urinary tract infection, site not specified: Secondary | ICD-10-CM

## 2013-07-20 LAB — BASIC METABOLIC PANEL
BUN: 49 mg/dL — ABNORMAL HIGH (ref 6–23)
CO2: 25 mEq/L (ref 19–32)
Calcium: 8.4 mg/dL (ref 8.4–10.5)
Creatinine, Ser: 1.9 mg/dL — ABNORMAL HIGH (ref 0.50–1.35)
GFR calc Af Amer: 39 mL/min — ABNORMAL LOW (ref >90)
GFR calc non Af Amer: 33 mL/min — ABNORMAL LOW (ref >90)
Sodium: 132 mEq/L — ABNORMAL LOW (ref 135–145)

## 2013-07-20 LAB — CBC
HCT: 26.6 % — ABNORMAL LOW (ref 39.0–52.0)
MCHC: 35.7 g/dL (ref 30.0–36.0)
MCV: 91.1 fL (ref 78.0–100.0)
Platelets: 165 10*3/uL (ref 150–400)
RBC: 2.92 MIL/uL — ABNORMAL LOW (ref 4.22–5.81)
RDW: 16.3 % — ABNORMAL HIGH (ref 11.5–15.5)
WBC: 12.7 10*3/uL — ABNORMAL HIGH (ref 4.0–10.5)

## 2013-07-20 LAB — PROTIME-INR
INR: 1.75 — ABNORMAL HIGH (ref 0.00–1.49)
Prothrombin Time: 19.9 seconds — ABNORMAL HIGH (ref 11.6–15.2)

## 2013-07-20 LAB — URINE CULTURE

## 2013-07-20 MED ORDER — WARFARIN SODIUM 5 MG PO TABS
5.0000 mg | ORAL_TABLET | Freq: Once | ORAL | Status: AC
  Administered 2013-07-20: 5 mg via ORAL
  Filled 2013-07-20: qty 1

## 2013-07-20 NOTE — Progress Notes (Signed)
Pts wife is expressing concern for Jared Landry's cognitive status. She states that she has noticed a decrease in his overall alertness, sleeping more often and having problems with word finding over the course of the last couple of months. She is concerned that he is experiencing "mini strokes" which is contributing to this problem. She expressed this concern to myself as well as a cardiologist who had come to visit the patient (was not the rounding cardiologist). The cardiologist redirected the pts wife to the primary team taking care of the patient. I will relay this concern to PCCM.

## 2013-07-20 NOTE — Progress Notes (Signed)
PULMONARY  / CRITICAL CARE MEDICINE  Name: Jared Landry MRN: [id number] DOB: 25-Nov-1940    ADMISSION DATE:  07/18/2013  REFERRING MD :  EDP PRIMARY SERVICE: PCCM  CHIEF COMPLAINT:  AMS, hypotension   BRIEF PATIENT DESCRIPTION: 72 year old man with history of chronic systolic heart failure (EF30-45%), AFib and recent admission for possible UTI, presenting from SNF with concerns for decreased urine output and altered mental status.  Hypotensive in ER.  Admitted to PCCM with sepsis  SIGNIFICANT EVENTS / STUDIES:  12/13 renal ultrasound>>>b/l non obstructing medullary calculi 12/13 Echo>>>mild LVH, EF 30 to 35%, diffuse hypokinesis, mild MR, severe LA dilation, mod RA dilation  LINES / TUBES: PIV  CULTURES: BC x 2 12/13>>> 1/2 GPC>>> Urine 12/13>>> neg  ANTIBIOTICS: Vancomycin 12/13>>> Cefepime 12/13>>>  SUBJECTIVE:  Feeling better.  Remains mildly hypotensive.     VITAL SIGNS: Temp:  [98 F (36.7 C)-100.4 F (38 C)] 98.5 F (36.9 C) (12/14 0416) Pulse Rate:  [69-78] 78 (12/14 0715) Resp:  [13-32] 14 (12/14 0715) BP: (84-182)/(46-142) 97/56 mmHg (12/14 0715) SpO2:  [90 %-100 %] 99 % (12/14 0715) Weight:  [210 lb 12.2 oz (95.6 kg)] 210 lb 12.2 oz (95.6 kg) (12/14 0500) HEMODYNAMICS:   VENTILATOR SETTINGS:   INTAKE / OUTPUT: Intake/Output     12/13 0701 - 12/14 0700 12/14 0701 - 12/15 0700   P.O. 577    I.V. (mL/kg) 220 (2.3)    IV Piggyback 500    Total Intake(mL/kg) 1297 (13.6)    Urine (mL/kg/hr) 955 (0.4) 125 (0.6)   Total Output 955 125   Net +342 -125          PHYSICAL EXAMINATION: General:  Chronically ill appearing male, NAD  Neuro:  Awake, alert, appropriate, MAE HEENT:  Mm moist, no JVD Cardiovascular:  s1s2 rrr, paced  Lungs:  resps even non labored, clear  Abdomen:  Soft, non tender, +bs Musculoskeletal:  Warm and dry, 1-2+ symetric BLE edema, warm, PVD  LABS:  CBC  Recent Labs Lab 07/18/13 2218 07/19/13 0810 07/20/13 0420  WBC  20.0* 17.7* 12.7*  HGB 9.5* 10.6* 9.5*  HCT 26.8* 29.9* 26.6*  PLT 192 192 165   Coag's  Recent Labs Lab 07/19/13 0810  INR 1.57*   BMET  Recent Labs Lab 07/19/13 0810 07/19/13 1135 07/20/13 0420  NA 137 135 132*  K 4.3 4.1 3.6  CL 101 98 96  CO2 26 25 25   BUN 50* 50* 49*  CREATININE 2.12* 2.08* 1.90*  GLUCOSE 100* 92 121*   Electrolytes  Recent Labs Lab 07/19/13 0810 07/19/13 1135 07/20/13 0420  CALCIUM 8.9 8.7 8.4  MG 2.2  --   --   PHOS 4.7*  --   --    Sepsis Markers  Recent Labs Lab 07/18/13 2244 07/19/13 0805  LATICACIDVEN 1.55 1.3   ABG  Recent Labs Lab 07/19/13 1453  PHART 7.470*  PCO2ART 35.0  PO2ART 80.6    Liver Enzymes  Recent Labs Lab 07/18/13 2242 07/19/13 0810  AST 105* 110*  ALT 79* 82*  ALKPHOS 157* 173*  BILITOT 1.4* 1.7*  ALBUMIN 2.8* 3.0*   Cardiac Enzymes  Recent Labs Lab 07/19/13 0810  TROPONINI <0.30  PROBNP 20313.0*   Glucose  Recent Labs Lab 07/19/13 0452  GLUCAP 95    Lab Results  Component Value Date   TSH 3.311 07/19/2013    Imaging US Renal  07/19/2013   CLINICAL DATA:  Evaluate hydronephrosis  EXAM: RENAL/URINARY TRACT  ULTRASOUND COMPLETE  COMPARISON:  None.  FINDINGS: Right Kidney:  Length: 10.7 cm. 4.8 mm nonobstructing medullary calculus lower pole. Otherwise appropriate corticomedullary differentiation without evidence of hydronephrosis, solid nor cystic masses.  Left Kidney:  Length: 10.7 cm. 6.9 mm calculus identified within the lower pole medullary portion. Otherwise no evidence of hydronephrosis. There is appropriate corticomedullary differentiation. A 2nd 6.9 mm calculus is appreciated within the lower pole left kidney nonobstructing.  Bladder:  Appears normal for degree of bladder distention.  IMPRESSION: Bilateral nonobstructing medullary calculi. There is no evidence of obstructive uropathy further sonographic abnormalities.   Electronically Signed   By: Salome Holmes M.D.   On:  07/19/2013 11:29   Dg Chest Port 1 View  07/18/2013   CLINICAL DATA:  Fever.  EXAM: PORTABLE CHEST - 1 VIEW  COMPARISON:  07/16/2013.  FINDINGS: Cardiomegaly. AICD. Mild vascular congestion without overt infiltrate or failure. Overall slight improved aeration. Clearing of bilateral pleural effusions. Calcified tortuous aorta. Negative osseous structures.  IMPRESSION: Cardiomegaly.  Slight improvement aeration.   Electronically Signed   By: Davonna Belling M.D.   On: 07/18/2013 22:55    ASSESSMENT / PLAN:  PULMONARY Hx COPD  P:   Albuterol neb while in hospital  CARDIOVASCULAR Chronic systolic CHF  PAF - s/p failed ablation  SIRS/Hypotension - much improved with volume. Lactate reassuring.   P:  Cardiology following  Continue hold home B blocker, lasix with relative hypotension  Continue home amiodarone  Coumadin per pharmacy - inr subtherapeutic  Continue asa  F/u bnp   KVO fluids  BLE venous dopplers pending  Pacemaker interrogation 12/15  RENAL Acute renal insuffiencey - Improving. In setting SIRS/hypotension with non obstructing kidney stones and hx BPH Renal calculi -- non obstructing P:   F/u chem  Continue foley - UOP ok    GASTROINTESTINAL No acute issue  P:   PRN miralax   HEMATOLOGIC No acute issue  P:  F/u cbc   INFECTIOUS SIRS/Sepsis -- source unclear.  Suspect urinary source but u/a with neg leuks and neg nitrites, many bacteria but culture negative.  CXR clear. No other s/s or c/o.   WBC trending down, chills resolved.  P:   Continue broad spectrum abx  ?other potential source - ?need to consider TEE - will await final blood culture results   ENDOCRINE Hypothyroid - last TSH >10.  TSH=3.31 P:   Continue synthroid @ current dose   NEUROLOGIC AMS - resolved.  P:   Supportive care   Will tx to SDU and ask Hussain (PCP) to assume care 12/15.    Danford Bad, NP 07/20/2013  9:07 AM Pager: [phone] or [phone]  [phone]      Reviewed above, examined pt, and agree with assessment/plan.  Much improved.  Appreciate help from cardiology.  Transfer to SDU.  Dr. Donette Larry will assume care from 12/15 and PCCM sign off.  Coralyn Helling, MD [facility] Pulmonary/Critical Care 07/20/2013, 2:08 PM Pager:  [phone] After 3pm call: [phone]

## 2013-07-20 NOTE — Progress Notes (Signed)
ANTICOAGULATION CONSULT NOTE - Follow-Up Consult  Pharmacy Consult for Coumadin Indication: atrial fibrillation  Allergies  Allergen Reactions  . Gabapentin Other (See Comments)    May cause tremors and shaking    Patient Measurements: Height: 5\' 8"  (172.7 cm) Weight: 210 lb 12.2 oz (95.6 kg) IBW/kg (Calculated) : 68.4 Heparin Dosing Weight: n/a  Vital Signs: Temp: 98.5 F (36.9 C) (12/14 1127) Temp src: Oral (12/14 1127) BP: 100/63 mmHg (12/14 1330) Pulse Rate: 74 (12/14 1330)  Labs:  Recent Labs  07/18/13 2218  07/19/13 0810 07/19/13 1135 07/20/13 0420 07/20/13 1100  HGB 9.5*  --  10.6*  --  9.5*  --   HCT 26.8*  --  29.9*  --  26.6*  --   PLT 192  --  192  --  165  --   LABPROT  --   --  18.3*  --   --  19.9*  INR  --   --  1.57*  --   --  1.75*  CREATININE  --   < > 2.12* 2.08* 1.90*  --   CKTOTAL  --   --  717*  --   --   --   CKMB  --   --  2.8  --   --   --   TROPONINI  --   --  <0.30  --   --   --   < > = values in this interval not displayed.  Estimated Creatinine Clearance: 38.8 ml/min (by C-G formula based on Cr of 1.9).   Medical History: Past Medical History  Diagnosis Date  . Atrial fibrillation     Cardioversion 07/20/11  transiently <3h successful  . CAD (coronary artery disease)     s/p drug eluting stent  . Diabetes mellitus     type II  . Hypertension   . Gout   . Osteoarthritis   . COPD (chronic obstructive pulmonary disease)   . 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  . Ventricular tachycardia     terminated by the ICD  . Chronic systolic heart failure     Medications:  Scheduled:  . ipratropium  0.5 mg Nebulization Q6H   And  . albuterol  2.5 mg Nebulization Q6H  . allopurinol  300 mg Oral Daily  . amiodarone  200 mg Oral Daily  . aspirin EC  81 mg Oral Daily  . atorvastatin  5 mg Oral Daily  . ceFEPime (MAXIPIME) IV  2 g Intravenous Q24H  . levothyroxine  50 mcg Oral QAC breakfast   . polyethylene glycol  17 g Oral Daily  . vancomycin  1,250 mg Intravenous Q24H  . Warfarin - Pharmacist Dosing Inpatient   Does not apply q1800    Assessment: 72 yo male on chronic Coumadin for afib admitted for AMS and hypotension.  INR remains subtherapeutic today at 1.75. No bleeding or complications noted per chart notes.  FYI- Per SNF records,  PTA dose was formerly = 4 mg daily except 2 mg on Mon/Fridays.  SNF MAR hard to interpret, but I think this was changed to 3 mg daily on 12/5.  INR = 4.76 on 12/5 per SNF records, 3.47 on 12/8, 2.54 on 12/9 and 1.84 on 12/11.  Unsure exactly how these INR changes correlate to his dosing. Spoke to wife, she said morning of admission he had a nosebleed.  When last seen at [facility] Coumadin clinic on 06/23/13 his dose was  2.5 mg daily except 1.25 mg on Mon/Fri.  Do not believe he was on amiodarone at that time.  Goal of Therapy:  INR 2-3 Monitor platelets by anticoagulation protocol: Yes   Plan:  1. Will repeat Coumadin 5 mg po x 1 today.  May need to reduce dosage soon. 2. F/u AM INR.  Tad Moore, BCPS  Clinical Pharmacist Pager [phone]  07/20/2013 1:49 PM

## 2013-07-20 NOTE — Progress Notes (Signed)
Patient ID: Jared Landry, male   DOB: [birth year], 72 y.o.   MRN: [id number] Subjective:  Improved markedly. Mental status seems at baseline. Denies chest pain or sob.  Objective:  Vital Signs in the last 24 hours: Temp:  [98 F (36.7 C)-100.4 F (38 C)] 98.5 F (36.9 C) (12/14 0416) Pulse Rate:  [69-78] 78 (12/14 0715) Resp:  [13-32] 14 (12/14 0715) BP: (84-182)/(46-142) 97/56 mmHg (12/14 0715) SpO2:  [90 %-100 %] 99 % (12/14 0715) Weight:  [210 lb 12.2 oz (95.6 kg)] 210 lb 12.2 oz (95.6 kg) (12/14 0500)  Intake/Output from previous day: 12/13 0701 - 12/14 0700 In: 1297 [P.O.:577; I.V.:220; IV Piggyback:500] Out: 955 [Urine:955] Intake/Output from this shift: Total I/O In: -  Out: 125 [Urine:125]  Physical Exam: Well appearing 72 yo man, NAD HEENT: Unremarkable Neck:  6 cm JVD, no thyromegally Back:  No CVA tenderness Lungs:  Clear except for rare scattered rales. HEART:  Regular rate rhythm, no murmurs, no rubs, no clicks Abd:  Flat, positive bowel sounds, no organomegally, no rebound, no guarding Ext:  2 plus pulses, no edema, no cyanosis, no clubbing Skin:  No rashes no nodules Neuro:  CN II through XII intact, motor grossly intact  Lab Results:  Recent Labs  07/19/13 0810 07/20/13 0420  WBC 17.7* 12.7*  HGB 10.6* 9.5*  PLT 192 165    Recent Labs  07/19/13 1135 07/20/13 0420  NA 135 132*  K 4.1 3.6  CL 98 96  CO2 25 25  GLUCOSE 92 121*  BUN 50* 49*  CREATININE 2.08* 1.90*    Recent Labs  07/19/13 0810  TROPONINI <0.30   Hepatic Function Panel  Recent Labs  07/19/13 0810  PROT 6.6  ALBUMIN 3.0*  AST 110*  ALT 82*  ALKPHOS 173*  BILITOT 1.7*  BILIDIR 0.7*  IBILI 1.0*   No results found for this basename: CHOL,  in the last 72 hours No results found for this basename: PROTIME,  in the last 72 hours  Imaging: US Renal  07/19/2013   CLINICAL DATA:  Evaluate hydronephrosis  EXAM: RENAL/URINARY TRACT ULTRASOUND COMPLETE  COMPARISON:   None.  FINDINGS: Right Kidney:  Length: 10.7 cm. 4.8 mm nonobstructing medullary calculus lower pole. Otherwise appropriate corticomedullary differentiation without evidence of hydronephrosis, solid nor cystic masses.  Left Kidney:  Length: 10.7 cm. 6.9 mm calculus identified within the lower pole medullary portion. Otherwise no evidence of hydronephrosis. There is appropriate corticomedullary differentiation. A 2nd 6.9 mm calculus is appreciated within the lower pole left kidney nonobstructing.  Bladder:  Appears normal for degree of bladder distention.  IMPRESSION: Bilateral nonobstructing medullary calculi. There is no evidence of obstructive uropathy further sonographic abnormalities.   Electronically Signed   By: Salome Holmes M.D.   On: 07/19/2013 11:29   Dg Chest Port 1 View  07/18/2013   CLINICAL DATA:  Fever.  EXAM: PORTABLE CHEST - 1 VIEW  COMPARISON:  07/16/2013.  FINDINGS: Cardiomegaly. AICD. Mild vascular congestion without overt infiltrate or failure. Overall slight improved aeration. Clearing of bilateral pleural effusions. Calcified tortuous aorta. Negative osseous structures.  IMPRESSION: Cardiomegaly.  Slight improvement aeration.   Electronically Signed   By: Davonna Belling M.D.   On: 07/18/2013 22:55    Cardiac Studies: Tele - nsr Assessment/Plan:  1. Elevated BNP with minimal clinical signs of acute CHF with chronic systolic heart failure - he does not appear volume overloaded at this point. Would continue home meds for CHF.  2. AMS/sepsis  syndrome  - he appears to have improved on medical therapy.  3. S/p ICD remotely - normal apparent device function. Will check. As he has improved nicely, I would not recommend TEE unless staph grows out of his blood cultures.   LOS: 2 days    Gregg Taylor,M.D. 07/20/2013, 8:46 AM

## 2013-07-20 NOTE — Progress Notes (Signed)
VASCULAR LAB PRELIMINARY  PRELIMINARY  PRELIMINARY  PRELIMINARY  Bilateral lower extremity venous duplex completed.    Preliminary report:  Bilateral:  No evidence of DVT, superficial thrombosis, or Baker's Cyst. Moderate interstitial fluid noted throughout the lower extremities . Pulsatile Doppler signals consistent with fluid overload or congestive heart failure   Latonya Nelon, RVS 07/20/2013, 9:51 AM

## 2013-07-21 ENCOUNTER — Encounter (HOSPITAL_COMMUNITY): Payer: Self-pay | Admitting: General Practice

## 2013-07-21 LAB — PROTIME-INR: Prothrombin Time: 21.8 seconds — ABNORMAL HIGH (ref 11.6–15.2)

## 2013-07-21 LAB — CBC
HCT: 25.5 % — ABNORMAL LOW (ref 39.0–52.0)
MCHC: 36.9 g/dL — ABNORMAL HIGH (ref 30.0–36.0)
MCV: 89.8 fL (ref 78.0–100.0)
Platelets: 174 10*3/uL (ref 150–400)
RDW: 16.3 % — ABNORMAL HIGH (ref 11.5–15.5)
WBC: 8.5 10*3/uL (ref 4.0–10.5)

## 2013-07-21 LAB — BASIC METABOLIC PANEL
BUN: 47 mg/dL — ABNORMAL HIGH (ref 6–23)
Chloride: 94 mEq/L — ABNORMAL LOW (ref 96–112)
Creatinine, Ser: 1.8 mg/dL — ABNORMAL HIGH (ref 0.50–1.35)
GFR calc Af Amer: 41 mL/min — ABNORMAL LOW (ref >90)
GFR calc non Af Amer: 36 mL/min — ABNORMAL LOW (ref >90)
Potassium: 4.2 mEq/L (ref 3.5–5.1)

## 2013-07-21 LAB — CULTURE, BLOOD (ROUTINE X 2)

## 2013-07-21 MED ORDER — ACETAMINOPHEN 325 MG PO TABS: 650.0000 mg | ORAL_TABLET | Freq: Four times a day (QID) | ORAL | Status: DC | PRN

## 2013-07-21 MED ORDER — SODIUM CHLORIDE 0.9 % IV SOLN
INTRAVENOUS | Status: DC
  Administered 2013-07-21: 18:00:00 via INTRAVENOUS

## 2013-07-21 MED ORDER — WHITE PETROLATUM GEL
Status: DC | PRN
  Filled 2013-07-21: qty 5

## 2013-07-21 MED ORDER — WHITE PETROLATUM GEL
Status: AC
  Administered 2013-07-21: 02:00:00
  Filled 2013-07-21: qty 5

## 2013-07-21 MED ORDER — SODIUM CHLORIDE 0.9 % IV SOLN
1.0000 g | Freq: Four times a day (QID) | INTRAVENOUS | Status: DC
  Administered 2013-07-21 – 2013-07-23 (×9): 1 g via INTRAVENOUS
  Filled 2013-07-21 (×10): qty 1000

## 2013-07-21 MED ORDER — FENTANYL CITRATE 0.05 MG/ML IJ SOLN
25.0000 ug | Freq: Once | INTRAMUSCULAR | Status: AC
  Administered 2013-07-21: 25 ug via INTRAVENOUS
  Filled 2013-07-21: qty 2

## 2013-07-21 MED ORDER — WARFARIN SODIUM 4 MG PO TABS
4.0000 mg | ORAL_TABLET | Freq: Once | ORAL | Status: AC
  Administered 2013-07-21: 4 mg via ORAL
  Filled 2013-07-21: qty 1

## 2013-07-21 NOTE — Clinical Documentation Improvement (Signed)
THIS DOCUMENT IS NOT A PERMANENT PART OF THE MEDICAL RECORD  Please update your documentation with the medical record to reflect your response to this query. If you need help knowing how to do this please call [phone].  07/21/13   Dear Dr.Oswell Say / Associates,  In a better effort to capture your patient's severity of illness, reflect appropriate length of stay and utilization of resources, a review of the patient medical record has revealed the following indicators.    Based on your clinical judgment, please clarify and document in a progress note and/or discharge summary the clinical condition associated with the following supporting information:  In responding to this query please exercise your independent judgment.  The fact that a query is asked, does not imply that any particular answer is desired or expected.   Possible Clinical Conditions?  ______yes-_Hyponatremia  _______Other Condition  _______Cannot Clinically Determine    Diagnostics: Sodium: 12/15: 129 12/14: 132   You may use possible, probable, or suspect with inpatient documentation. possible, probable, suspected diagnoses MUST be documented at the time of discharge  Reviewed: additional documentation in the medical record  Thank You,  Marciano Sequin, Clinical Documentation Specialist: [phone] Health Information Management 

## 2013-07-21 NOTE — Progress Notes (Signed)
Patient ID: Jared Landry, male   DOB: 01-04-1941, 72 y.o.   MRN: [id number]  Subjective:  Improved markedly. Mental status seems at baseline. Denies chest pain or SOB.  Objective:  Vital Signs in the last 24 hours: Temp:  [97.8 F (36.6 C)-99.8 F (37.7 C)] 97.8 F (36.6 C) (12/15 0817) Pulse Rate:  [70-78] 74 (12/15 0700) Resp:  [12-29] 18 (12/15 0700) BP: (88-120)/(40-78) 106/54 mmHg (12/15 0700) SpO2:  [89 %-100 %] 98 % (12/15 0817) Weight:  [208 lb 8.9 oz (94.6 kg)] 208 lb 8.9 oz (94.6 kg) (12/15 0500)  Intake/Output from previous day: 12/14 0701 - 12/15 0700 In: 844 [P.O.:844] Out: 1215 [Urine:1215] Intake/Output from this shift: Total I/O In: -  Out: 125 [Urine:125]  Physical Exam: Gen: Well appearing 72 yo man in NAD HEENT: Unremarkable Neck:  No JVD Lungs: CTA bilaterally, no wheezes rales or rhonchi HEART: Regular rate rhythm, no murmur, rub or gallop Abd: Soft, nondistended Ext: No cyanosis, clubbing or edema Neuro: Alert and oriented, no focal deficits  Current Meds: . ipratropium  0.5 mg Nebulization Q6H   And  . albuterol  2.5 mg Nebulization Q6H  . allopurinol  300 mg Oral Daily  . amiodarone  200 mg Oral Daily  . aspirin EC  81 mg Oral Daily  . atorvastatin  5 mg Oral Daily  . ceFEPime (MAXIPIME) IV  2 g Intravenous Q24H  . levothyroxine  50 mcg Oral QAC breakfast  . polyethylene glycol  17 g Oral Daily  . vancomycin  1,250 mg Intravenous Q24H  . Warfarin - Pharmacist Dosing Inpatient   Does not apply q1800    Lab Results:  Recent Labs  07/20/13 0420 07/21/13 0401  WBC 12.7* 8.5  HGB 9.5* 9.4*  PLT 165 174    Recent Labs  07/20/13 0420 07/21/13 0401  NA 132* 129*  K 3.6 4.2  CL 96 94*  CO2 25 23  GLUCOSE 121* 120*  BUN 49* 47*  CREATININE 1.90* 1.80*    Recent Labs  07/19/13 0810  TROPONINI <0.30    Recent Labs  07/19/13 0810  PROT 6.6  ALBUMIN 3.0*  AST 110*  ALT 82*  ALKPHOS 173*  BILITOT 1.7*  BILIDIR  0.7*  IBILI 1.0*    Imaging: US Renal  07/19/2013   CLINICAL DATA:  Evaluate hydronephrosis  EXAM: RENAL/URINARY TRACT ULTRASOUND COMPLETE  COMPARISON:  None.  FINDINGS: Right Kidney:  Length: 10.7 cm. 4.8 mm nonobstructing medullary calculus lower pole. Otherwise appropriate corticomedullary differentiation without evidence of hydronephrosis, solid nor cystic masses.  Left Kidney:  Length: 10.7 cm. 6.9 mm calculus identified within the lower pole medullary portion. Otherwise no evidence of hydronephrosis. There is appropriate corticomedullary differentiation. A 2nd 6.9 mm calculus is appreciated within the lower pole left kidney nonobstructing.  Bladder:  Appears normal for degree of bladder distention.  IMPRESSION: Bilateral nonobstructing medullary calculi. There is no evidence of obstructive uropathy further sonographic abnormalities.   Electronically Signed   By: Salome Holmes M.D.   On: 07/19/2013 11:29    Cardiac Studies: Tele - AV paced Device interrogation - this AM performed by industry - normal BiV ICD function  Assessment/Plan:  1. AMS/sepsis syndrome  - urosepsis; improving; per primary team   2. Ischemic CM with chronic systolic HF - stable; not volume overloaded currently; CRT-D functioning normally; resume medical therapy as BP allows 3. PAF - stable; currently AV paced; continue warfarin for stroke prevention 4. CAD - stable  without anginal symptoms; continue medical therapy; followed by Dr Anne Fu 5. CKD 6. COPD  Dr. Graciela Husbands to see Jared Duff, PA-C 07/21/2013, 8:23 AM  Nw with enterococcus in the blood  AAAAAAGGGHHHHH!!! 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

## 2013-07-21 NOTE — Progress Notes (Signed)
TEE scheduled for tomorrow at 1430 with Dr. Jens Som. Jared Romano PA-C

## 2013-07-21 NOTE — Care Management Note (Signed)
    Page 1 of 1   07/23/2013     3:14:57 PM   CARE MANAGEMENT NOTE 07/23/2013  Patient:  Jared Landry, Jared Landry   Account Number:  [id number]  Date Initiated:  07/21/2013  Documentation initiated by:  GRAVES-BIGELOW,Lynley Killilea  Subjective/Objective Assessment:   Pt admitted for Altered mental status. Pt is from [facility]. CM did make CSW aware.     Action/Plan:   CSW to assist with disposition needs.   Anticipated DC Date:  07/23/2013   Anticipated DC Plan:  SKILLED NURSING FACILITY  In-house referral  Clinical Social Worker      DC Planning Services  CM consult      Choice offered to / List presented to:             Status of service:  Completed, signed off Medicare Important Message given?   (If response is "NO", the following Medicare IM given date fields will be blank) Date Medicare IM given:   Date Additional Medicare IM given:    Discharge Disposition:  SKILLED NURSING FACILITY  Per UR Regulation:  Reviewed for med. necessity/level of care/duration of stay  If discussed at Long Length of Stay Meetings, dates discussed:   07/24/2013    Comments:  07-23-13 [address] [place] [phone] Plan for d/c to [facility] 07-24-13.

## 2013-07-21 NOTE — Progress Notes (Signed)
Subjective: Pt feel better No SOB  Objective: Vital signs in last 24 hours: Temp:  [98.5 F (36.9 C)-99.8 F (37.7 C)] 99.4 F (37.4 C) (12/15 0405) Pulse Rate:  [70-78] 74 (12/15 0700) Resp:  [12-29] 18 (12/15 0700) BP: (88-120)/(40-78) 106/54 mmHg (12/15 0700) SpO2:  [89 %-100 %] 93 % (12/15 0700) Weight:  [94.6 kg (208 lb 8.9 oz)] 94.6 kg (208 lb 8.9 oz) (12/15 0500) Weight change: -1 kg (-2 lb 3.3 oz) Last BM Date: 07/19/13  Intake/Output from previous day: 12/14 0701 - 12/15 0700 In: 844 [P.O.:844] Out: 1215 [Urine:1215] Intake/Output this shift:    General appearance: alert Resp: clear to auscultation bilaterally Cardio: regular rate and rhythm GI: soft, non-tender; bowel sounds normal; no masses,  no organomegaly Extremities: edema leg edema trace; left arm swelling  Lab Results:  Recent Labs  07/20/13 0420 07/21/13 0401  WBC 12.7* 8.5  HGB 9.5* 9.4*  HCT 26.6* 25.5*  PLT 165 174   BMET  Recent Labs  07/20/13 0420 07/21/13 0401  NA 132* 129*  K 3.6 4.2  CL 96 94*  CO2 25 23  GLUCOSE 121* 120*  BUN 49* 47*  CREATININE 1.90* 1.80*  CALCIUM 8.4 8.5    Studies/Results: US Renal  07/19/2013   CLINICAL DATA:  Evaluate hydronephrosis  EXAM: RENAL/URINARY TRACT ULTRASOUND COMPLETE  COMPARISON:  None.  FINDINGS: Right Kidney:  Length: 10.7 cm. 4.8 mm nonobstructing medullary calculus lower pole. Otherwise appropriate corticomedullary differentiation without evidence of hydronephrosis, solid nor cystic masses.  Left Kidney:  Length: 10.7 cm. 6.9 mm calculus identified within the lower pole medullary portion. Otherwise no evidence of hydronephrosis. There is appropriate corticomedullary differentiation. A 2nd 6.9 mm calculus is appreciated within the lower pole left kidney nonobstructing.  Bladder:  Appears normal for degree of bladder distention.  IMPRESSION: Bilateral nonobstructing medullary calculi. There is no evidence of obstructive uropathy further  sonographic abnormalities.   Electronically Signed   By: Salome Holmes M.D.   On: 07/19/2013 11:29    Medications: I have reviewed the patient's current medications.  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  LOS: 3 days   Audwin Semper 07/21/2013, 7:52 AM

## 2013-07-21 NOTE — Clinical Documentation Improvement (Signed)
THIS DOCUMENT IS NOT A PERMANENT PART OF THE MEDICAL RECORD  Please update your documentation with the medical record to reflect your response to this query. If you need help knowing how to do this please call [phone].  07/21/13  Dear Dr. Donette Larry Marton Redwood  In an effort to better capture your patient's severity of illness, reflect appropriate length of stay and utilization of resources, a review of the patient medical record has revealed the following indicators.    Based on your clinical judgment, please clarify and document in a progress note and/or discharge summary the clinical condition associated with the following supporting information:  In responding to this query please exercise your independent judgment.  The fact that a query is asked, does not imply that any particular answer is desired or expected.  Possible Clinical Conditions?  ____yes___Encephalopathy (describe type if known)                       Anoxic                       Septic- due to sepsis                       Alcoholic                        Hepatic                       Hypertensive                       Metabolic                       Toxic  _______Drug induced confusion/delirium _______Acute confusion _______Acute delirium _______Acute exacerbation of known dementia (indicate type) _______New diagnosis of Dementia, Alzheimer's, cerebral atherosclerosis _______Hyponatremia / Hypernatremia _______Poisoning / Overdose _______Hypoxemia / Hypoxia _______Other Condition _______Cannot Clinically Determine    Risk Factors: AMS noted per 07/19/13 progress notes.   Reviewed: additional documentation in the medical record  Thank You,  Marciano Sequin, Clinical Documentation Specialist: [phone] Health Information Management [place]

## 2013-07-21 NOTE — Progress Notes (Signed)
UR Completed Kandee Escalante Graves-Bigelow, RN,BSN [phone]  

## 2013-07-21 NOTE — Progress Notes (Signed)
ANTICOAGULATION CONSULT NOTE - Follow-Up Consult  Pharmacy Consult for Coumadin Indication: atrial fibrillation  Allergies  Allergen Reactions  . Gabapentin Other (See Comments)    May cause tremors and shaking    Patient Measurements: Height: 5\' 8"  (172.7 cm) Weight: 208 lb 8.9 oz (94.6 kg) IBW/kg (Calculated) : 68.4 Heparin Dosing Weight: n/a  Vital Signs: Temp: 97.8 F (36.6 C) (12/15 0817) Temp src: Oral (12/15 0817) BP: 105/85 mmHg (12/15 1000) Pulse Rate: 68 (12/15 1000)  Labs:  Recent Labs  07/19/13 0810 07/19/13 1135 07/20/13 0420 07/20/13 1100 07/21/13 0401  HGB 10.6*  --  9.5*  --  9.4*  HCT 29.9*  --  26.6*  --  25.5*  PLT 192  --  165  --  174  LABPROT 18.3*  --   --  19.9* 21.8*  INR 1.57*  --   --  1.75* 1.97*  CREATININE 2.12* 2.08* 1.90*  --  1.80*  CKTOTAL 717*  --   --   --   --   CKMB 2.8  --   --   --   --   TROPONINI <0.30  --   --   --   --     Estimated Creatinine Clearance: 40.8 ml/min (by C-G formula based on Cr of 1.8).  Assessment: 72 yo male on chronic Coumadin for afib admitted for AMS and hypotension.  INR remains subtherapeutic today at 1.97 but trending up nicely. No bleeding or complications noted per chart notes. CBC is stable.   FYI- Per SNF records,  PTA dose was formerly = 4 mg daily except 2 mg on Mon/Fridays.  SNF MAR hard to interpret, but I think this was changed to 3 mg daily on 12/5.  INR = 4.76 on 12/5 per SNF records, 3.47 on 12/8, 2.54 on 12/9 and 1.84 on 12/11.  Unsure exactly how these INR changes correlate to his dosing. Spoke to wife, she said morning of admission he had a nosebleed.  When last seen at [facility] Coumadin clinic on 06/23/13 his dose was 2.5 mg daily except 1.25 mg on Mon/Fri.  Do not believe he was on amiodarone at that time.  Goal of Therapy:  INR 2-3   Plan:  1. Warfarin 4mg  PO x 1 tonight 2. F/u AM INR  Lysle Pearl, PharmD, BCPS Pager # [phone] 07/21/2013 11:29 AM

## 2013-07-21 NOTE — Consult Note (Signed)
INFECTIOUS DISEASE CONSULT NOTE  Date of Admission:  07/18/2013  Date of Consult:  07/21/2013  Reason for Consult: Enterococcal Bacteremia Referring Physician: Eula Listen  Impression/Recommendation Enterococcal Bacteremia Defibrillator DM2  Pt needs TEE Would repeat BCx Change anbx to amp Hold gent for now due to his kidney dysfunction Repeat BCx.   Comment- Possible that he has bacteremia from a urinary source although also possible that this has seeded his device. Would like a TEE to make sure that this is not the case. He will need prolonged therapy (4-6 weeks).   Thank you so much for this interesting consult,   Johny Sax (pager) [phone] [email or web address]  Jared Landry is an 72 y.o. male.  HPI: 72 yo M with hx of CAD, DM2, CHF (EF 30-35%), defibrillator (2011), admitted to hospital on 12-12 from SNF/rehab with decreased urinary output and worsened mental status. He was previously admitted 11-29 to 12-2 with bladder wall thickening on CT, UCx polymicrobial, and fever/hematuria/leukocytosis. He was treated with ceftriaxone, colchicine, steroids in hospital, d/c back to SNF/rehab.   On 12-12 his WBC was 20, he was afebrile, and he was hypotensive in ED. he was started on vanco/cefepime and admitted to ICU. His WBC and Cr have improved, temps have improved. His BCx from admission now show 2/2 Enterococcus (pan-sens).   Past Medical History  Diagnosis Date  . Atrial fibrillation     Cardioversion 07/20/11  transiently <3h successful  . CAD (coronary artery disease)     s/p drug eluting stent  . Diabetes mellitus     type II  . Hypertension   . Gout   . Osteoarthritis   . COPD (chronic obstructive pulmonary disease)   . 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 Jude  . Ventricular tachycardia     terminated by the ICD  . Chronic systolic heart failure     Past Surgical History  Procedure Laterality Date  . Appendectomy      . Tonsillectomy    . Vasectomy    . Surgical i &d after brown recluse inner thigh    . Cardiac defibrillator placement      St. Jude   . Cardioversion  07/20/2011    Procedure: CARDIOVERSION;  Surgeon: Donato Schultz, MD;  Location: [facility] OR;  Service: Cardiovascular;  Laterality: N/A;  . Cardioversion  01/18/2012    Procedure: CARDIOVERSION;  Surgeon: Donato Schultz, MD;  Location: [facility] OR;  Service: Cardiovascular;  Laterality: N/A;     Allergies  Allergen Reactions  . Gabapentin Other (See Comments)    May cause tremors and shaking    Medications:  Scheduled: . ipratropium  0.5 mg Nebulization Q6H   And  . albuterol  2.5 mg Nebulization Q6H  . allopurinol  300 mg Oral Daily  . amiodarone  200 mg Oral Daily  . aspirin EC  81 mg Oral Daily  . atorvastatin  5 mg Oral Daily  . ceFEPime (MAXIPIME) IV  2 g Intravenous Q24H  . levothyroxine  50 mcg Oral QAC breakfast  . polyethylene glycol  17 g Oral Daily  . vancomycin  1,250 mg Intravenous Q24H  . Warfarin - Pharmacist Dosing Inpatient   Does not apply q1800    Total days of antibiotics: 3 (vanco/cefepime)         Social History:  reports that he quit smoking about 6 years ago. His smoking use included Cigarettes. He smoked 0.30 packs per day. He does not have any smokeless  tobacco history on file. He reports that he drinks about 16.8 ounces of alcohol per week. He reports that he does not use illicit drugs.  Family History  Problem Relation Age of Onset  . Heart failure Mother   . Diabetes type II Sister   . Appendicitis Father     rupture    General ROS: +chills, no fever, 1 loose BM prior to admission- no further, no fevers, no abd pain, see HPI.   Blood pressure 105/85, pulse 68, temperature 97.8 F (36.6 C), temperature source Oral, resp. rate 18, height 5\' 8"  (1.727 m), weight 94.6 kg (208 lb 8.9 oz), SpO2 97.00%. General appearance: alert, cooperative and no distress Eyes: negative findings: pupils equal, round, reactive  to light and accomodation Throat: normal findings: oropharynx pink & moist without lesions or evidence of thrush Neck: no adenopathy and supple, symmetrical, trachea midline Lungs: clear to auscultation bilaterally Chest wall: L chest defribrillator. non-tender, no fluctuance.  Abdomen: normal findings: bowel sounds normal and soft, non-tender Extremities: edema none and no nail bed lesions, no finger/toe lesions c/i IE   Results for orders placed during the hospital encounter of 07/18/13 (from the past 48 hour(s))  BASIC METABOLIC PANEL     Status: Abnormal   Collection Time    07/19/13 11:35 AM      Result Value Range   Sodium 135  135 - 145 mEq/L   Potassium 4.1  3.5 - 5.1 mEq/L   Chloride 98  96 - 112 mEq/L   CO2 25  19 - 32 mEq/L   Glucose, Bld 92  70 - 99 mg/dL   BUN 50 (*) 6 - 23 mg/dL   Creatinine, Ser 1.61 (*) 0.50 - 1.35 mg/dL   Calcium 8.7  8.4 - 09.6 mg/dL   GFR calc non Af Amer 30 (*) >90 mL/min   GFR calc Af Amer 35 (*) >90 mL/min   Comment: (NOTE)     The eGFR has been calculated using the CKD EPI equation.     This calculation has not been validated in all clinical situations.     eGFR's persistently <90 mL/min signify possible Chronic Kidney     Disease.  BLOOD GAS, ARTERIAL     Status: Abnormal   Collection Time    07/19/13  2:53 PM      Result Value Range   O2 Content 2.0     Delivery systems NASAL CANNULA     pH, Arterial 7.470 (*) 7.350 - 7.450   pCO2 arterial 35.0  35.0 - 45.0 mmHg   pO2, Arterial 80.6  80.0 - 100.0 mmHg   Bicarbonate 24.8 (*) 20.0 - 24.0 mEq/L   TCO2 25.8  0 - 100 mmol/L   Acid-Base Excess 1.6  0.0 - 2.0 mmol/L   O2 Saturation 94.5     Patient temperature 100.9     Collection site LEFT RADIAL     Drawn by [id number]     Sample type ARTERIAL DRAW     Allens test (pass/fail) PASS  PASS  BASIC METABOLIC PANEL     Status: Abnormal   Collection Time    07/20/13  4:20 AM      Result Value Range   Sodium 132 (*) 135 - 145 mEq/L    Potassium 3.6  3.5 - 5.1 mEq/L   Chloride 96  96 - 112 mEq/L   CO2 25  19 - 32 mEq/L   Glucose, Bld 121 (*) 70 - 99 mg/dL  BUN 49 (*) 6 - 23 mg/dL   Creatinine, Ser 1.61 (*) 0.50 - 1.35 mg/dL   Calcium 8.4  8.4 - 09.6 mg/dL   GFR calc non Af Amer 33 (*) >90 mL/min   GFR calc Af Amer 39 (*) >90 mL/min   Comment: (NOTE)     The eGFR has been calculated using the CKD EPI equation.     This calculation has not been validated in all clinical situations.     eGFR's persistently <90 mL/min signify possible Chronic Kidney     Disease.  CBC     Status: Abnormal   Collection Time    07/20/13  4:20 AM      Result Value Range   WBC 12.7 (*) 4.0 - 10.5 K/uL   RBC 2.92 (*) 4.22 - 5.81 MIL/uL   Hemoglobin 9.5 (*) 13.0 - 17.0 g/dL   HCT 04.5 (*) 40.9 - 81.1 %   MCV 91.1  78.0 - 100.0 fL   MCH 32.5  26.0 - 34.0 pg   MCHC 35.7  30.0 - 36.0 g/dL   RDW 91.4 (*) 78.2 - 95.6 %   Platelets 165  150 - 400 K/uL  PROTIME-INR     Status: Abnormal   Collection Time    07/20/13 11:00 AM      Result Value Range   Prothrombin Time 19.9 (*) 11.6 - 15.2 seconds   INR 1.75 (*) 0.00 - 1.49  CBC     Status: Abnormal   Collection Time    07/21/13  4:01 AM      Result Value Range   WBC 8.5  4.0 - 10.5 K/uL   RBC 2.84 (*) 4.22 - 5.81 MIL/uL   Hemoglobin 9.4 (*) 13.0 - 17.0 g/dL   HCT 21.3 (*) 08.6 - 57.8 %   MCV 89.8  78.0 - 100.0 fL   MCH 33.1  26.0 - 34.0 pg   MCHC 36.9 (*) 30.0 - 36.0 g/dL   RDW 46.9 (*) 62.9 - 52.8 %   Platelets 174  150 - 400 K/uL  BASIC METABOLIC PANEL     Status: Abnormal   Collection Time    07/21/13  4:01 AM      Result Value Range   Sodium 129 (*) 135 - 145 mEq/L   Potassium 4.2  3.5 - 5.1 mEq/L   Chloride 94 (*) 96 - 112 mEq/L   CO2 23  19 - 32 mEq/L   Glucose, Bld 120 (*) 70 - 99 mg/dL   BUN 47 (*) 6 - 23 mg/dL   Creatinine, Ser 4.13 (*) 0.50 - 1.35 mg/dL   Calcium 8.5  8.4 - 24.4 mg/dL   GFR calc non Af Amer 36 (*) >90 mL/min   GFR calc Af Amer 41 (*) >90 mL/min    Comment: (NOTE)     The eGFR has been calculated using the CKD EPI equation.     This calculation has not been validated in all clinical situations.     eGFR's persistently <90 mL/min signify possible Chronic Kidney     Disease.  PROTIME-INR     Status: Abnormal   Collection Time    07/21/13  4:01 AM      Result Value Range   Prothrombin Time 21.8 (*) 11.6 - 15.2 seconds   INR 1.97 (*) 0.00 - 1.49      Component Value Date/Time   SDES URINE, CATHETERIZED 07/18/2013 2344   SPECREQUEST NONE 07/18/2013 2344   CULT  Value: NO GROWTH Performed at [facility] 07/18/2013 2344   REPTSTATUS 07/20/2013 FINAL 07/18/2013 2344   US Renal  07/19/2013   CLINICAL DATA:  Evaluate hydronephrosis  EXAM: RENAL/URINARY TRACT ULTRASOUND COMPLETE  COMPARISON:  None.  FINDINGS: Right Kidney:  Length: 10.7 cm. 4.8 mm nonobstructing medullary calculus lower pole. Otherwise appropriate corticomedullary differentiation without evidence of hydronephrosis, solid nor cystic masses.  Left Kidney:  Length: 10.7 cm. 6.9 mm calculus identified within the lower pole medullary portion. Otherwise no evidence of hydronephrosis. There is appropriate corticomedullary differentiation. A 2nd 6.9 mm calculus is appreciated within the lower pole left kidney nonobstructing.  Bladder:  Appears normal for degree of bladder distention.  IMPRESSION: Bilateral nonobstructing medullary calculi. There is no evidence of obstructive uropathy further sonographic abnormalities.   Electronically Signed   By: Salome Holmes M.D.   On: 07/19/2013 11:29   Recent Results (from the past 240 hour(s))  CULTURE, BLOOD (ROUTINE X 2)     Status: None   Collection Time    07/18/13 10:30 PM      Result Value Range Status   Specimen Description BLOOD RIGHT ARM   Final   Special Requests BOTTLES DRAWN AEROBIC AND ANAEROBIC 10CC   Final   Culture  Setup Time     Final   Value: 07/19/2013 10:58     Performed at [facility]   Culture      Final   Value: ENTEROCOCCUS SPECIES     Note: COMBINATION THERAPY OF HIGH DOSE AMPICILLIN OR VANCOMYCIN, PLUS AN AMINOGLYCOSIDE, IS USUALLY INDICATED FOR SERIOUS ENTEROCOCCAL INFECTIONS.     Note: Gram Stain Report Called to,Read Back By and Verified With: Juanda Bond RN 07/19/13 2124PM  [facility]     Performed at [facility]   Report Status 07/21/2013 FINAL   Final   Organism ID, Bacteria ENTEROCOCCUS SPECIES   Final  CULTURE, BLOOD (ROUTINE X 2)     Status: None   Collection Time    07/18/13 10:35 PM      Result Value Range Status   Specimen Description BLOOD RIGHT FOREARM   Final   Special Requests BOTTLES DRAWN AEROBIC ONLY 10CC   Final   Culture  Setup Time     Final   Value: 07/19/2013 10:58     Performed at [facility]   Culture     Final   Value: ENTEROCOCCUS SPECIES     Note: SUSCEPTIBILITIES PERFORMED ON PREVIOUS CULTURE WITHIN THE LAST 5 DAYS.     Note: Gram Stain Report Called to,Read Back By and Verified With: JAMES ARTIS  07/19/13 2124PM [facility]     Performed at [facility]   Report Status 07/21/2013 FINAL   Final  URINE CULTURE     Status: None   Collection Time    07/18/13 11:44 PM      Result Value Range Status   Specimen Description URINE, CATHETERIZED   Final   Special Requests NONE   Final   Culture  Setup Time     Final   Value: 07/19/2013 11:39     Performed at [facility]   Colony Count     Final   Value: NO GROWTH     Performed at [facility]   Culture     Final   Value: NO GROWTH     Performed at [facility]   Report Status 07/20/2013 FINAL   Final  MRSA PCR SCREENING  Status: None   Collection Time    07/19/13  5:28 AM      Result Value Range Status   MRSA by PCR NEGATIVE  NEGATIVE Final   Comment:            The GeneXpert MRSA Assay (FDA     approved for NASAL specimens     only), is one component of a     comprehensive MRSA colonization     surveillance program. It is not     intended to  diagnose MRSA     infection nor to guide or     monitor treatment for     MRSA infections.      07/21/2013, 10:44 AM     LOS: 3 days

## 2013-07-21 NOTE — Evaluation (Signed)
Physical Therapy Evaluation Patient Details Name: Jared Landry MRN: [id number] DOB: [birth year] Today's Date: 07/21/2013 Time: 4696-2952 PT Time Calculation (min): 30 min  PT Assessment / Plan / Recommendation History of Present Illness  Pt adm from SNF with AMS and urosepsis.  Pt with recent admission with fever and gout. Hx of afib, CAD, DM2, gout, HTN, ischemic cardiomyopathy, s/p IVCD.  Clinical Impression  Pt admitted with above. Pt currently with functional limitations due to the deficits listed below (see PT Problem List).  Pt will benefit from skilled PT to increase their independence and safety with mobility to allow discharge to the venue listed below. Per notes wife hoping to take pt home instead of SNF. Pt had been doing very well at SNF and had progressed to amb with a cane.  However now pt is much weaker and requiring more assist.       PT Assessment  Patient needs continued PT services    Follow Up Recommendations  SNF    Does the patient have the potential to tolerate intense rehabilitation      Barriers to Discharge        Equipment Recommendations  None recommended by PT    Recommendations for Other Services     Frequency Min 3X/week    Precautions / Restrictions Precautions Precautions: Fall   Pertinent Vitals/Pain See flow sheet      Mobility  Bed Mobility Bed Mobility: Supine to Sit;Sitting - Scoot to Edge of Bed Supine to Sit: 3: Mod assist;With rails;HOB elevated Sitting - Scoot to Edge of Bed: 3: Mod assist Details for Bed Mobility Assistance: Assist to bring legs off EOB and to raise trunk up. Transfers Sit to Stand: 4: Min assist;With upper extremity assist;From bed Stand to Sit: 4: Min assist;With upper extremity assist;With armrests;To chair/3-in-1 Details for Transfer Assistance: Assist to bring hips up. Ambulation/Gait Ambulation/Gait Assistance: 4: Min assist Ambulation Distance (Feet): 40 Feet Assistive device: Rolling  walker Ambulation/Gait Assistance Details: Verbal cues to stand more erect. Heavy upper extremity support on walker. Gait Pattern: Decreased step length - right;Decreased step length - left;Shuffle;Trunk flexed;Decreased hip/knee flexion - right;Decreased hip/knee flexion - left Gait velocity: decreased    Exercises     PT Diagnosis: Difficulty walking;Generalized weakness  PT Problem List: Decreased strength;Decreased activity tolerance;Decreased balance;Decreased mobility;Decreased knowledge of use of DME PT Treatment Interventions: DME instruction;Gait training;Functional mobility training;Therapeutic activities;Therapeutic exercise;Balance training;Patient/family education     PT Goals(Current goals can be found in the care plan section) Acute Rehab PT Goals Patient Stated Goal: Get stronger PT Goal Formulation: With patient Time For Goal Achievement: 07/28/13 Potential to Achieve Goals: Good  Visit Information  Last PT Received On: 07/21/13 Assistance Needed: +1 History of Present Illness: Pt adm from SNF with AMS and urosepsis.  Pt with recent admission with fever and gout. Hx of afib, CAD, DM2, gout, HTN, ischemic cardiomyopathy, s/p IVCD.       Prior Functioning  Home Living Family/patient expects to be discharged to:: Skilled nursing facility Living Arrangements: Spouse/significant other Available Help at Discharge: Family;Available 24 hours/day Type of Home: House Home Access: Stairs to enter [employer] of Steps: 6 Home Layout: Two level;Able to live on main level with bedroom/bathroom;Other (Comment) Alternate Level Stairs-Number of Steps: 13 Home Equipment: Cane - single point;Bedside commode;Shower seat - built in;Hand held shower head Prior Function Level of Independence: Needs assistance Gait / Transfers Assistance Needed: At SNF pt had progressed to amb with straight cane with supervision. Communication Communication:  No difficulties     Cognition  Cognition Arousal/Alertness: Awake/alert Behavior During Therapy: WFL for tasks assessed/performed Overall Cognitive Status: Within Functional Limits for tasks assessed    Extremity/Trunk Assessment Upper Extremity Assessment Upper Extremity Assessment: Generalized weakness Lower Extremity Assessment Lower Extremity Assessment: Generalized weakness   Balance Static Standing Balance Static Standing - Balance Support: Bilateral upper extremity supported Static Standing - Level of Assistance: 5: Stand by assistance  End of Session PT - End of Session Equipment Utilized During Treatment: Gait belt Activity Tolerance: Patient limited by fatigue Patient left: in chair;with call bell/phone within reach Nurse Communication: Mobility status  GP     Rollin Kotowski 07/21/2013, 2:34 PM  [facility] PT [phone]

## 2013-07-22 ENCOUNTER — Ambulatory Visit: Payer: Medicare Other | Admitting: Cardiology

## 2013-07-22 ENCOUNTER — Inpatient Hospital Stay (HOSPITAL_COMMUNITY): Payer: Medicare Other

## 2013-07-22 ENCOUNTER — Encounter (HOSPITAL_COMMUNITY)
Admission: EM | Disposition: A | Discharge status: Skilled Nursing Facility | Payer: Self-pay | Source: Home / Self Care | Attending: Internal Medicine

## 2013-07-22 ENCOUNTER — Encounter (HOSPITAL_COMMUNITY): Payer: Self-pay | Admitting: *Deleted

## 2013-07-22 DIAGNOSIS — E119 Type 2 diabetes mellitus without complications: Secondary | ICD-10-CM

## 2013-07-22 DIAGNOSIS — B952 Enterococcus as the cause of diseases classified elsewhere: Secondary | ICD-10-CM

## 2013-07-22 DIAGNOSIS — R7881 Bacteremia: Secondary | ICD-10-CM

## 2013-07-22 DIAGNOSIS — I369 Nonrheumatic tricuspid valve disorder, unspecified: Secondary | ICD-10-CM

## 2013-07-22 HISTORY — PX: TEE WITHOUT CARDIOVERSION: SHX5443

## 2013-07-22 LAB — BASIC METABOLIC PANEL
BUN: 40 mg/dL — ABNORMAL HIGH (ref 6–23)
Calcium: 8.5 mg/dL (ref 8.4–10.5)
Creatinine, Ser: 1.53 mg/dL — ABNORMAL HIGH (ref 0.50–1.35)
GFR calc Af Amer: 50 mL/min — ABNORMAL LOW (ref >90)
GFR calc non Af Amer: 43 mL/min — ABNORMAL LOW (ref >90)

## 2013-07-22 LAB — PROTIME-INR: Prothrombin Time: 22.9 seconds — ABNORMAL HIGH (ref 11.6–15.2)

## 2013-07-22 SURGERY — ECHOCARDIOGRAM, TRANSESOPHAGEAL
Anesthesia: Moderate Sedation

## 2013-07-22 MED ORDER — MIDAZOLAM HCL 10 MG/2ML IJ SOLN
INTRAMUSCULAR | Status: DC | PRN
  Administered 2013-07-22: 2 mg via INTRAVENOUS
  Administered 2013-07-22: 1 mg via INTRAVENOUS

## 2013-07-22 MED ORDER — WARFARIN SODIUM 4 MG PO TABS
4.0000 mg | ORAL_TABLET | Freq: Once | ORAL | Status: AC
Start: 2013-07-22 — End: 2013-07-22
  Administered 2013-07-22: 4 mg via ORAL
  Filled 2013-07-22: qty 1

## 2013-07-22 MED ORDER — FENTANYL CITRATE 0.05 MG/ML IJ SOLN
INTRAMUSCULAR | Status: DC | PRN
  Administered 2013-07-22 (×2): 25 ug via INTRAVENOUS

## 2013-07-22 MED ORDER — BUTAMBEN-TETRACAINE-BENZOCAINE 2-2-14 % EX AERO
INHALATION_SPRAY | CUTANEOUS | Status: DC | PRN
  Administered 2013-07-22: 2 via TOPICAL

## 2013-07-22 MED ORDER — POLYETHYLENE GLYCOL 3350 17 G PO PACK
17.0000 g | PACK | Freq: Every day | ORAL | Status: DC
  Administered 2013-07-22 – 2013-07-23 (×2): 17 g via ORAL
  Filled 2013-07-22 (×3): qty 1

## 2013-07-22 MED ORDER — MIDAZOLAM HCL 5 MG/ML IJ SOLN
INTRAMUSCULAR | Status: AC
  Filled 2013-07-22: qty 2

## 2013-07-22 MED ORDER — SODIUM CHLORIDE 3 % IN NEBU
INHALATION_SOLUTION | RESPIRATORY_TRACT | Status: AC
  Administered 2013-07-22: 04:00:00
  Filled 2013-07-22: qty 15

## 2013-07-22 MED ORDER — FENTANYL CITRATE 0.05 MG/ML IJ SOLN
INTRAMUSCULAR | Status: AC
  Filled 2013-07-22: qty 2

## 2013-07-22 NOTE — Progress Notes (Addendum)
Clinical Social Work Department CLINICAL SOCIAL WORK PLACEMENT NOTE 07/22/2013  Patient:  Jared Landry, Jared Landry  Account Number:  [id number] Admit date:  07/18/2013  Clinical Social Worker:  Sharol Harness, Theresia Majors  Date/time:  07/22/2013 10:00 AM  Clinical Social Work is seeking post-discharge placement for this patient at the following level of care:   SKILLED NURSING   (*CSW will update this form in Epic as items are completed)   07/22/2013  Patient/family provided with [facility] Department of Clinical Social Work's list of facilities offering this level of care within the geographic area requested by the patient (or if unable, by the patient's family).  07/22/2013  Patient/family informed of their freedom to choose among providers that offer the needed level of care, that participate in Medicare, Medicaid or managed care program needed by the patient, have an available bed and are willing to accept the patient.  07/22/2013  Patient/family informed of [facility]' ownership interest in [facility], as well as of the fact that they are under no obligation to receive care at this facility.  PASARR submitted to EDS on Existing PASARR number received from EDS on   FL2 transmitted to all facilities in geographic area requested by pt/family on  07/22/2013 FL2 transmitted to all facilities within larger geographic area on   Patient informed that his/her managed care company has contracts with or will negotiate with  certain facilities, including the following:     Patient/family informed of bed offers received:  07/22/2013 Patient chooses bed at [facility] Physician recommends and patient chooses bed at    Patient to be transferred to  on   Patient to be transferred to facility by   The following physician request were entered in Epic:   Additional Comments:   Fenris Cauble, LCSWA [phone]

## 2013-07-22 NOTE — Progress Notes (Signed)
Subjective: Pt some SOB Awaiting TEE  Objective: Vital signs in last 24 hours: Temp:  [97.6 F (36.4 C)-98.7 F (37.1 C)] 98.7 F (37.1 C) (12/16 0546) Pulse Rate:  [68-84] 77 (12/16 0546) Resp:  [18-21] 18 (12/16 0546) BP: (103-115)/(56-85) 111/57 mmHg (12/16 0546) SpO2:  [95 %-98 %] 96 % (12/16 0546) Weight:  [93.9 kg (207 lb 0.2 oz)] 93.9 kg (207 lb 0.2 oz) (12/16 0546) Weight change: -0.7 kg (-1 lb 8.7 oz) Last BM Date: 07/22/13  Intake/Output from previous day: 12/15 0701 - 12/16 0700 In: 670 [P.O.:640; I.V.:30] Out: 1850 [Urine:1850] Intake/Output this shift:    General appearance: alert Resp: clear to auscultation bilaterally Cardio: regular rate and rhythm Extremities: edema +2  Lab Results:  Recent Labs  07/20/13 0420 07/21/13 0401  WBC 12.7* 8.5  HGB 9.5* 9.4*  HCT 26.6* 25.5*  PLT 165 174   BMET  Recent Labs  07/21/13 0401 07/22/13 0345  NA 129* 131*  K 4.2 4.3  CL 94* 97  CO2 23 25  GLUCOSE 120* 120*  BUN 47* 40*  CREATININE 1.80* 1.53*  CALCIUM 8.5 8.5    Studies/Results: No results found.  Medications: I have reviewed the patient's current medications.  Assessment/Plan: Sepsis/Bacteremia - UTI source; blood culture enteroccous-  Pan sensitivity- TEE today ID - consulted- will need Prolong ABX 4-6 week- need PICC line  ABx? vanc once a day better then Ampicillin 4 times- if going home? CHF Chronic- Will resume lasix 40 mg in am - BP better now  PT consult-noted- rec SNF DM- with neuropathy- some leg pain  CKD stage 3 - cr stable at baseline  COPD stable- neb/ advair   LOS: 4 days   Nemesio Castrillon 07/22/2013, 7:52 AM

## 2013-07-22 NOTE — Progress Notes (Signed)
CSW (Clinical Child psychotherapist) spoke with pt wife over the phone several times to give bed offers and answer additional questions. Pt wife said they would like to accept the bed offer at [facility]. CSW left message with facility to notify and asked facility to please contact wife to schedule time to complete paperwork.  Neena Beecham, LCSWA [phone]

## 2013-07-22 NOTE — Interval H&P Note (Signed)
History and Physical Interval Note:  07/22/2013 3:26 PM  Jared Landry  has presented today for surgery, with the diagnosis of endocarditis   The various methods of treatment have been discussed with the patient and family. After consideration of risks, benefits and other options for treatment, the patient has consented to  Procedure(s): TRANSESOPHAGEAL ECHOCARDIOGRAM (TEE) (N/A) as a surgical intervention .  The patient's history has been reviewed, patient examined, no change in status, stable for surgery.  I have reviewed the patient's chart and labs.  Questions were answered to the patient's satisfaction.     Elyn Aquas.

## 2013-07-22 NOTE — Progress Notes (Signed)
  Echocardiogram Echocardiogram Transesophageal has been performed.  Jorje Guild 07/22/2013, 3:58 PM

## 2013-07-22 NOTE — H&P (View-Only) (Signed)
INFECTIOUS DISEASE PROGRESS NOTE  ID: Jared Landry is a 72 y.o. male with  Active Problems:   Altered mental status  Subjective: without complaints, hungry  Abtx:  Anti-infectives   Start     Dose/Rate Route Frequency Ordered Stop   07/21/13 1200  ampicillin (OMNIPEN) 1 g in sodium chloride 0.9 % 50 mL IVPB     1 g 150 mL/hr over 20 Minutes Intravenous 4 times per day 07/21/13 1129     07/20/13 0600  vancomycin (VANCOCIN) 1,250 mg in sodium chloride 0.9 % 250 mL IVPB  Status:  Discontinued    Comments:  Pharmacy may change dose   1,250 mg 166.7 mL/hr over 90 Minutes Intravenous Every 24 hours 07/19/13 0542 07/21/13 1129   07/19/13 0600  ceFEPIme (MAXIPIME) 2 g in dextrose 5 % 50 mL IVPB  Status:  Discontinued     2 g 100 mL/hr over 30 Minutes Intravenous Every 24 hours 07/19/13 0542 07/21/13 1129   07/19/13 0045  vancomycin (VANCOCIN) 2,000 mg in sodium chloride 0.9 % 500 mL IVPB     2,000 mg 250 mL/hr over 120 Minutes Intravenous  Once 07/19/13 0040 07/19/13 0425   07/19/13 0045  piperacillin-tazobactam (ZOSYN) IVPB 3.375 g     3.375 g 100 mL/hr over 30 Minutes Intravenous  Once 07/19/13 0040 07/19/13 0151   07/18/13 2330  cefTRIAXone (ROCEPHIN) 1 g in dextrose 5 % 50 mL IVPB     1 g 100 mL/hr over 30 Minutes Intravenous  Once 07/18/13 2317 07/19/13 0015      Medications:  Scheduled: . allopurinol  300 mg Oral Daily  . amiodarone  200 mg Oral Daily  . ampicillin (OMNIPEN) IV  1 g Intravenous Q6H  . aspirin EC  81 mg Oral Daily  . atorvastatin  5 mg Oral Daily  . levothyroxine  50 mcg Oral QAC breakfast  . polyethylene glycol  17 g Oral Daily  . warfarin  4 mg Oral ONCE-1800  . Warfarin - Pharmacist Dosing Inpatient   Does not apply q1800    Objective: Vital signs in last 24 hours: Temp:  [97.6 F (36.4 C)-98.7 F (37.1 C)] 98.7 F (37.1 C) (12/16 0546) Pulse Rate:  [74-77] 77 (12/16 0546) Resp:  [18-20] 18 (12/16 0546) BP: (111-115)/(57-70) 111/57 mmHg  (12/16 0546) SpO2:  [96 %-98 %] 96 % (12/16 0546) Weight:  [93.9 kg (207 lb 0.2 oz)] 93.9 kg (207 lb 0.2 oz) (12/16 0546)   General appearance: alert, cooperative and no distress Resp: clear to auscultation bilaterally Chest wall: ICD non-tender, no fluctuance. Cardio: regular rate and rhythm GI: normal findings: bowel sounds normal and soft, non-tender  Lab Results  Recent Labs  07/20/13 0420 07/21/13 0401 07/22/13 0345  WBC 12.7* 8.5  --   HGB 9.5* 9.4*  --   HCT 26.6* 25.5*  --   NA 132* 129* 131*  K 3.6 4.2 4.3  CL 96 94* 97  CO2 25 23 25   BUN 49* 47* 40*  CREATININE 1.90* 1.80* 1.53*   Liver Panel No results found for this basename: PROT, ALBUMIN, AST, ALT, ALKPHOS, BILITOT, BILIDIR, IBILI,  in the last 72 hours Sedimentation Rate No results found for this basename: ESRSEDRATE,  in the last 72 hours C-Reactive Protein No results found for this basename: CRP,  in the last 72 hours  Microbiology: Recent Results (from the past 240 hour(s))  CULTURE, BLOOD (ROUTINE X 2)     Status: None   Collection  Time    07/18/13 10:30 PM      Result Value Range Status   Specimen Description BLOOD RIGHT ARM   Final   Special Requests BOTTLES DRAWN AEROBIC AND ANAEROBIC 10CC   Final   Culture  Setup Time     Final   Value: 07/19/2013 10:58     Performed at [facility]   Culture     Final   Value: ENTEROCOCCUS SPECIES     Note: COMBINATION THERAPY OF HIGH DOSE AMPICILLIN OR VANCOMYCIN, PLUS AN AMINOGLYCOSIDE, IS USUALLY INDICATED FOR SERIOUS ENTEROCOCCAL INFECTIONS.     Note: Gram Stain Report Called to,Read Back By and Verified With: Juanda Bond RN 07/19/13 2124PM  MITCV     Performed at [facility]   Report Status 07/21/2013 FINAL   Final   Organism ID, Bacteria ENTEROCOCCUS SPECIES   Final  CULTURE, BLOOD (ROUTINE X 2)     Status: None   Collection Time    07/18/13 10:35 PM      Result Value Range Status   Specimen Description BLOOD RIGHT FOREARM   Final    Special Requests BOTTLES DRAWN AEROBIC ONLY 10CC   Final   Culture  Setup Time     Final   Value: 07/19/2013 10:58     Performed at [facility]   Culture     Final   Value: ENTEROCOCCUS SPECIES     Note: SUSCEPTIBILITIES PERFORMED ON PREVIOUS CULTURE WITHIN THE LAST 5 DAYS.     Note: Gram Stain Report Called to,Read Back By and Verified With: JAMES ARTIS  07/19/13 2124PM MITCV     Performed at [facility]   Report Status 07/21/2013 FINAL   Final  URINE CULTURE     Status: None   Collection Time    07/18/13 11:44 PM      Result Value Range Status   Specimen Description URINE, CATHETERIZED   Final   Special Requests NONE   Final   Culture  Setup Time     Final   Value: 07/19/2013 11:39     Performed at [facility]   Colony Count     Final   Value: NO GROWTH     Performed at [facility]   Culture     Final   Value: NO GROWTH     Performed at [facility]   Report Status 07/20/2013 FINAL   Final  MRSA PCR SCREENING     Status: None   Collection Time    07/19/13  5:28 AM      Result Value Range Status   MRSA by PCR NEGATIVE  NEGATIVE Final   Comment:            The GeneXpert MRSA Assay (FDA     approved for NASAL specimens     only), is one component of a     comprehensive MRSA colonization     surveillance program. It is not     intended to diagnose MRSA     infection nor to guide or     monitor treatment for     MRSA infections.  CULTURE, BLOOD (ROUTINE X 2)     Status: None   Collection Time    07/21/13 12:30 PM      Result Value Range Status   Specimen Description BLOOD ARM LEFT   Final   Special Requests     Final   Value: BOTTLES DRAWN AEROBIC AND ANAEROBIC  BLUE 10CC RED 7CC   Culture  Setup Time     Final   Value: 07/21/2013 16:52     Performed at [facility]   Culture     Final   Value:        BLOOD CULTURE RECEIVED NO GROWTH TO DATE CULTURE WILL BE HELD FOR 5 DAYS BEFORE ISSUING A FINAL NEGATIVE REPORT      Performed at [facility]   Report Status PENDING   Incomplete    Studies/Results: No results found.   Assessment/Plan: Enterococcal Bacteremia  Defibrillator  DM2  Total days of antibiotics: 4 (ampicillin)  To get TEE today Will decide on length of therapy after this. 4 or 6 weeks, as well may need surgical intervention.  vanco would be easier but has a higher incidence of failure/resistance. Also, he has renal dysfunction.  Repeat BCx are (-) so far         Johny Sax Infectious Diseases (pager) [phone] [email or web address] 07/22/2013, 11:16 AM  LOS: 4 days

## 2013-07-22 NOTE — Progress Notes (Signed)
Clinical Social Work Department BRIEF PSYCHOSOCIAL ASSESSMENT 07/22/2013  Patient:  Jared Landry, Jared Landry     Account Number:  [id number]     Admit date:  07/18/2013  Clinical Social Worker:  Harless Nakayama  Date/Time:  07/22/2013 10:00 AM  Referred by:  Physician  Date Referred:  07/22/2013 Referred for  SNF Placement   Other Referral:   Interview type:  Patient Other interview type:   Spoke with pt at bedside and pt wife on the phone    PSYCHOSOCIAL DATA Living Status:  FACILITY Admitted from facility:  [facility] Level of care:  Skilled Nursing Facility Primary support name:  Jadakiss Barish [phone] Primary support relationship to patient:  SPOUSE Degree of support available:   Pt has supportive family    CURRENT CONCERNS Current Concerns  Post-Acute Placement   Other Concerns:    SOCIAL WORK ASSESSMENT / PLAN CSW spoke with pt about dc plans. Pt initially informed CSW his wife is hoping to have him dc home. However, during second conversation pt stated MD has spoken with him and his wife and they were understanding that SNF may be more appropriate at dc. Pt asked CSW to call his wife as she has been looking into facilities and will be able to answer CSW questions. CSW called Mrs. Fidalgo and spoke to her about SNF. CSW was informed that pt and pt family would like new SNF search as pt does not want to return to previous SNF. CSW explained referral process. Pt wife understanding and thanked CSW. Pt wife informed CSW they have no preferences at this time would like a private room if possible.   Assessment/plan status:  Psychosocial Support/Ongoing Assessment of Needs Other assessment/ plan:   Information/referral to community resources:   SNF list to be provided with bed offers.    PATIENT'S/FAMILY'S RESPONSE TO PLAN OF CARE: Pt and pt family are agreeable to dc to a different SNF.       Joseluis Alessio, LCSWA [phone]

## 2013-07-22 NOTE — Progress Notes (Signed)
ANTICOAGULATION CONSULT NOTE - Follow-Up Consult  Pharmacy Consult for Coumadin Indication: atrial fibrillation  Allergies  Allergen Reactions  . Gabapentin Other (See Comments)    May cause tremors and shaking    Patient Measurements: Height: 5\' 8"  (172.7 cm) Weight: 207 lb 0.2 oz (93.9 kg) IBW/kg (Calculated) : 68.4 Heparin Dosing Weight: n/a  Vital Signs: Temp: 98.7 F (37.1 C) (12/16 0546) Temp src: Oral (12/16 0546) BP: 111/57 mmHg (12/16 0546) Pulse Rate: 77 (12/16 0546)  Labs:  Recent Labs  07/20/13 0420 07/20/13 1100 07/21/13 0401 07/22/13 0345  HGB 9.5*  --  9.4*  --   HCT 26.6*  --  25.5*  --   PLT 165  --  174  --   LABPROT  --  19.9* 21.8* 22.9*  INR  --  1.75* 1.97* 2.10*  CREATININE 1.90*  --  1.80* 1.53*    Estimated Creatinine Clearance: 47.8 ml/min (by C-G formula based on Cr of 1.53).  Assessment: 72 yo male on chronic Coumadin for afib admitted for AMS and hypotension.  INR is now therapeutic at 2.1. No bleeding or complications noted per chart notes. CBC is stable as of 12/15.   FYI- Per SNF records,  PTA dose was formerly = 4 mg daily except 2 mg on Mon/Fridays.  SNF MAR hard to interpret, but I think this was changed to 3 mg daily on 12/5.  INR = 4.76 on 12/5 per SNF records, 3.47 on 12/8, 2.54 on 12/9 and 1.84 on 12/11.  Unsure exactly how these INR changes correlate to his dosing. Spoke to wife, she said morning of admission he had a nosebleed.  When last seen at [facility] clinic on 06/23/13 his dose was 2.5 mg daily except 1.25 mg on Mon/Fri.  Do not believe he was on amiodarone at that time.  Goal of Therapy:  INR 2-3   Plan:  1. Repeat Warfarin 4mg  PO x 1 tonight 2. F/u AM INR  Lysle Pearl, PharmD, BCPS Pager # [phone] 07/22/2013 8:30 AM

## 2013-07-22 NOTE — Procedures (Signed)
Successful placement of dual lumen power injectable PICC line to right basilic vein. Length 46cm Tip at lower SVC/RA No complications Ready for use.

## 2013-07-22 NOTE — Progress Notes (Signed)
PT Cancellation Note  Patient Details Name: Jared Landry MRN: [id number] DOB: 1941/02/24   Cancelled Treatment:    Reason Eval/Treat Not Completed: Patient at procedure or test/unavailable. Attempted to see patient however off floor. WIll follow up in AM   Robinette, Adline Potter 07/22/2013, 1:50 PM

## 2013-07-22 NOTE — CV Procedure (Signed)
    Transesophageal Echocardiogram Note  ERLING ARRAZOLA [id number] 07-31-1941  Procedure: Transesophageal Echocardiogram Indications: + blood cultures  Procedure Details Consent: Obtained Time Out: Verified patient identification, verified procedure, site/side was marked, verified correct patient position, special equipment/implants available, Radiology Safety Procedures followed,  medications/allergies/relevent history reviewed, required imaging and test results available.  Performed  Medications: Fentanyl: 50 mcg iv Versed: 3 mg iv   Left Ventrical:  Moderate LV dysfunction, EF 40-45%  Mitral Valve: normal MV, trace MR, no vegetation  Aortic Valve: normal, no vegetation,   Tricuspid Valve:  Moderate- severe TR, no vegetation  Pulmonic Valve: normal,  Trivial PI, no vegetation  Left Atrium/ Left atrial appendage: extensive spontaneous contrast, no thrombi  Atrial septum: intact  Aorta: mild calcification.   Complications: No apparent complications Patient did tolerate procedure well.   Vesta Mixer, Montez Hageman., MD, [facility] 07/22/2013, 3:52 PM

## 2013-07-22 NOTE — Progress Notes (Signed)
Patient ID: GAIUS ISHAQ, male   DOB: 05-03-1941, 72 y.o.   MRN: [id number]  Subjective:  Improved markedly. Mental status seems at baseline. Denies chest pain or SOB.  Objective:  Vital Signs in the last 24 hours: Temp:  [98.5 F (36.9 C)-98.7 F (37.1 C)] 98.7 F (37.1 C) (12/16 1332) Pulse Rate:  [73-78] 75 (12/16 1550) Resp:  [13-20] 19 (12/16 1550) BP: (111-140)/(57-78) 135/72 mmHg (12/16 1545) SpO2:  [95 %-99 %] 96 % (12/16 1550) Weight:  [207 lb 0.2 oz (93.9 kg)] 207 lb 0.2 oz (93.9 kg) (12/16 0546)  Intake/Output from previous day: 12/15 0701 - 12/16 0700 In: 670 [P.O.:640; I.V.:30] Out: 1850 [Urine:1850] Intake/Output from this shift:    Physical Exam: Gen: Well appearing 72 yo man in NAD HEENT: Unremarkable Neck:  No JVD Lungs: CTA bilaterally, no wheezes rales or rhonchi HEART: Regular rate rhythm, no murmur, rub or gallop Abd: Soft, nondistended Ext: No cyanosis, clubbing or edema Neuro: Alert and oriented, no focal deficits  Current Meds: . allopurinol  300 mg Oral Daily  . amiodarone  200 mg Oral Daily  . ampicillin (OMNIPEN) IV  1 g Intravenous Q6H  . aspirin EC  81 mg Oral Daily  . atorvastatin  5 mg Oral Daily  . levothyroxine  50 mcg Oral QAC breakfast  . polyethylene glycol  17 g Oral Daily  . warfarin  4 mg Oral ONCE-1800  . Warfarin - Pharmacist Dosing Inpatient   Does not apply q1800    Lab Results:  Recent Labs  07/20/13 0420 07/21/13 0401  WBC 12.7* 8.5  HGB 9.5* 9.4*  PLT 165 174    Recent Labs  07/21/13 0401 07/22/13 0345  NA 129* 131*  K 4.2 4.3  CL 94* 97  CO2 23 25  GLUCOSE 120* 120*  BUN 47* 40*  CREATININE 1.80* 1.53*   No results found for this basename: TROPONINI, CK, MB,  in the last 72 hours No results found for this basename: PROT, ALBUMIN, AST, ALT, ALKPHOS, BILITOT, BILIDIR, IBILI,  in the last 72 hours  Imaging: Ir US Guide Vasc Access Right  07/22/2013   CLINICAL DATA:  Patient with bacteremia,  need PICC for antibiotics, patient denies any history of kidney dysfunction.  EXAM: Right dual lumen power injectable PICC LINE PLACEMENT WITH ULTRASOUND AND FLUOROSCOPIC GUIDANCE  FLUOROSCOPY TIME:  0.3 minutes  PROCEDURE: The patient was advised of the possible risks and complications and agreed to undergo the procedure. The patient was then brought to the angiographic suite for the procedure.  The right arm was prepped with chlorhexidine, draped in the usual sterile fashion using maximum barrier technique (cap and mask, sterile gown, sterile gloves, large sterile sheet, hand hygiene and cutaneous antisepsis) and infiltrated locally with 1% Lidocaine.  Ultrasound demonstrated patency of the right basilic vein, and this was documented with an image. Under real-time ultrasound guidance, this vein was accessed with a 21 gauge micropuncture needle and image documentation was performed. A 0.018 wire was introduced in to the vein. Over this, a 5 [place] double lumen power-injectable PICC was advanced to the lower SVC/right atrial junction. Fluoroscopy during the procedure and fluoro spot radiograph confirms appropriate catheter position. The catheter was flushed and covered with a sterile dressing.  Complications: none.  IMPRESSION: Successful right arm power injectable dual lumen PICC line placement with ultrasound and fluoroscopic guidance. The catheter is ready for use.  Read By:  Pattricia Boss PA-C   Electronically Signed  By: Irish Lack M.D.   On: 07/22/2013 13:15   Ir Fluoro Guide Cv Midline Picc Right  07/22/2013   CLINICAL DATA:  Patient with bacteremia, need PICC for antibiotics, patient denies any history of kidney dysfunction.  EXAM: Right dual lumen power injectable PICC LINE PLACEMENT WITH ULTRASOUND AND FLUOROSCOPIC GUIDANCE  FLUOROSCOPY TIME:  0.3 minutes  PROCEDURE: The patient was advised of the possible risks and complications and agreed to undergo the procedure. The patient was then brought  to the angiographic suite for the procedure.  The right arm was prepped with chlorhexidine, draped in the usual sterile fashion using maximum barrier technique (cap and mask, sterile gown, sterile gloves, large sterile sheet, hand hygiene and cutaneous antisepsis) and infiltrated locally with 1% Lidocaine.  Ultrasound demonstrated patency of the right basilic vein, and this was documented with an image. Under real-time ultrasound guidance, this vein was accessed with a 21 gauge micropuncture needle and image documentation was performed. A 0.018 wire was introduced in to the vein. Over this, a 5 [place] double lumen power-injectable PICC was advanced to the lower SVC/right atrial junction. Fluoroscopy during the procedure and fluoro spot radiograph confirms appropriate catheter position. The catheter was flushed and covered with a sterile dressing.  Complications: none.  IMPRESSION: Successful right arm power injectable dual lumen PICC line placement with ultrasound and fluoroscopic guidance. The catheter is ready for use.  Read By:  Pattricia Boss PA-C   Electronically Signed   By: Irish Lack M.D.   On: 07/22/2013 13:15    Cardiac Studies: Tele - AV paced Device interrogation - this AM performed by industry - normal BiV ICD function  Assessment/Plan:  1. AMS/sepsis syndrome  - urosepsis; improving; per primary team   2. Ischemic CM with chronic systolic HF - stable; not volume overloaded currently; CRT-D functioning normally; resume medical therapy as BP allows 3. PAF - stable; currently AV paced; continue warfarin for stroke prevention 4. CAD - stable without anginal symptoms; continue medical therapy; followed by Dr Anne Fu 5. CKD 6. COPD     Sherryl Manges,    07/22/2013, 3:53 PM  tee >>NO VEGETATIONS

## 2013-07-22 NOTE — Progress Notes (Signed)
 INFECTIOUS DISEASE PROGRESS NOTE  ID: Jared Landry is a 72 y.o. male with  Active Problems:   Altered mental status  Subjective: without complaints, hungry  Abtx:  Anti-infectives   Start     Dose/Rate Route Frequency Ordered Stop   07/21/13 1200  ampicillin (OMNIPEN) 1 g in sodium chloride 0.9 % 50 mL IVPB     1 g 150 mL/hr over 20 Minutes Intravenous 4 times per day 07/21/13 1129     07/20/13 0600  vancomycin (VANCOCIN) 1,250 mg in sodium chloride 0.9 % 250 mL IVPB  Status:  Discontinued    Comments:  Pharmacy may change dose   1,250 mg 166.7 mL/hr over 90 Minutes Intravenous Every 24 hours 07/19/13 0542 07/21/13 1129   07/19/13 0600  ceFEPIme (MAXIPIME) 2 g in dextrose 5 % 50 mL IVPB  Status:  Discontinued     2 g 100 mL/hr over 30 Minutes Intravenous Every 24 hours 07/19/13 0542 07/21/13 1129   07/19/13 0045  vancomycin (VANCOCIN) 2,000 mg in sodium chloride 0.9 % 500 mL IVPB     2,000 mg 250 mL/hr over 120 Minutes Intravenous  Once 07/19/13 0040 07/19/13 0425   07/19/13 0045  piperacillin-tazobactam (ZOSYN) IVPB 3.375 g     3.375 g 100 mL/hr over 30 Minutes Intravenous  Once 07/19/13 0040 07/19/13 0151   07/18/13 2330  cefTRIAXone (ROCEPHIN) 1 g in dextrose 5 % 50 mL IVPB     1 g 100 mL/hr over 30 Minutes Intravenous  Once 07/18/13 2317 07/19/13 0015      Medications:  Scheduled: . allopurinol  300 mg Oral Daily  . amiodarone  200 mg Oral Daily  . ampicillin (OMNIPEN) IV  1 g Intravenous Q6H  . aspirin EC  81 mg Oral Daily  . atorvastatin  5 mg Oral Daily  . levothyroxine  50 mcg Oral QAC breakfast  . polyethylene glycol  17 g Oral Daily  . warfarin  4 mg Oral ONCE-1800  . Warfarin - Pharmacist Dosing Inpatient   Does not apply q1800    Objective: Vital signs in last 24 hours: Temp:  [97.6 F (36.4 C)-98.7 F (37.1 C)] 98.7 F (37.1 C) (12/16 0546) Pulse Rate:  [74-77] 77 (12/16 0546) Resp:  [18-20] 18 (12/16 0546) BP: (111-115)/(57-70) 111/57 mmHg  (12/16 0546) SpO2:  [96 %-98 %] 96 % (12/16 0546) Weight:  [93.9 kg (207 lb 0.2 oz)] 93.9 kg (207 lb 0.2 oz) (12/16 0546)   General appearance: alert, cooperative and no distress Resp: clear to auscultation bilaterally Chest wall: ICD non-tender, no fluctuance. Cardio: regular rate and rhythm GI: normal findings: bowel sounds normal and soft, non-tender  Lab Results  Recent Labs  07/20/13 0420 07/21/13 0401 07/22/13 0345  WBC 12.7* 8.5  --   HGB 9.5* 9.4*  --   HCT 26.6* 25.5*  --   NA 132* 129* 131*  K 3.6 4.2 4.3  CL 96 94* 97  CO2 25 23 25  BUN 49* 47* 40*  CREATININE 1.90* 1.80* 1.53*   Liver Panel No results found for this basename: PROT, ALBUMIN, AST, ALT, ALKPHOS, BILITOT, BILIDIR, IBILI,  in the last 72 hours Sedimentation Rate No results found for this basename: ESRSEDRATE,  in the last 72 hours C-Reactive Protein No results found for this basename: CRP,  in the last 72 hours  Microbiology: Recent Results (from the past 240 hour(s))  CULTURE, BLOOD (ROUTINE X 2)     Status: None   Collection   Time    07/18/13 10:30 PM      Result Value Range Status   Specimen Description BLOOD RIGHT ARM   Final   Special Requests BOTTLES DRAWN AEROBIC AND ANAEROBIC 10CC   Final   Culture  Setup Time     Final   Value: 07/19/2013 10:58     Performed at [facility]   Culture     Final   Value: ENTEROCOCCUS SPECIES     Note: COMBINATION THERAPY OF HIGH DOSE AMPICILLIN OR VANCOMYCIN, PLUS AN AMINOGLYCOSIDE, IS USUALLY INDICATED FOR SERIOUS ENTEROCOCCAL INFECTIONS.     Note: Gram Stain Report Called to,Read Back By and Verified With: JAMES ARTIS RN 07/19/13 2124PM  MITCV     Performed at [facility]   Report Status 07/21/2013 FINAL   Final   Organism ID, Bacteria ENTEROCOCCUS SPECIES   Final  CULTURE, BLOOD (ROUTINE X 2)     Status: None   Collection Time    07/18/13 10:35 PM      Result Value Range Status   Specimen Description BLOOD RIGHT FOREARM   Final    Special Requests BOTTLES DRAWN AEROBIC ONLY 10CC   Final   Culture  Setup Time     Final   Value: 07/19/2013 10:58     Performed at [facility]   Culture     Final   Value: ENTEROCOCCUS SPECIES     Note: SUSCEPTIBILITIES PERFORMED ON PREVIOUS CULTURE WITHIN THE LAST 5 DAYS.     Note: Gram Stain Report Called to,Read Back By and Verified With: JAMES ARTIS  07/19/13 2124PM MITCV     Performed at [facility]   Report Status 07/21/2013 FINAL   Final  URINE CULTURE     Status: None   Collection Time    07/18/13 11:44 PM      Result Value Range Status   Specimen Description URINE, CATHETERIZED   Final   Special Requests NONE   Final   Culture  Setup Time     Final   Value: 07/19/2013 11:39     Performed at [facility]   Colony Count     Final   Value: NO GROWTH     Performed at [facility]   Culture     Final   Value: NO GROWTH     Performed at [facility]   Report Status 07/20/2013 FINAL   Final  MRSA PCR SCREENING     Status: None   Collection Time    07/19/13  5:28 AM      Result Value Range Status   MRSA by PCR NEGATIVE  NEGATIVE Final   Comment:            The GeneXpert MRSA Assay (FDA     approved for NASAL specimens     only), is one component of a     comprehensive MRSA colonization     surveillance program. It is not     intended to diagnose MRSA     infection nor to guide or     monitor treatment for     MRSA infections.  CULTURE, BLOOD (ROUTINE X 2)     Status: None   Collection Time    07/21/13 12:30 PM      Result Value Range Status   Specimen Description BLOOD ARM LEFT   Final   Special Requests     Final   Value: BOTTLES DRAWN AEROBIC AND ANAEROBIC   BLUE 10CC RED 7CC   Culture  Setup Time     Final   Value: 07/21/2013 16:52     Performed at [facility]   Culture     Final   Value:        BLOOD CULTURE RECEIVED NO GROWTH TO DATE CULTURE WILL BE HELD FOR 5 DAYS BEFORE ISSUING A FINAL NEGATIVE REPORT      Performed at [facility]   Report Status PENDING   Incomplete    Studies/Results: No results found.   Assessment/Plan: Enterococcal Bacteremia  Defibrillator  DM2  Total days of antibiotics: 4 (ampicillin)  To get TEE today Will decide on length of therapy after this. 4 or 6 weeks, as well may need surgical intervention.  vanco would be easier but has a higher incidence of failure/resistance. Also, he has renal dysfunction.  Repeat BCx are (-) so far         Brylyn Novakovich Infectious Diseases (pager) [phone] [email or web address] 07/22/2013, 11:16 AM  LOS: 4 days    

## 2013-07-23 ENCOUNTER — Encounter (HOSPITAL_COMMUNITY): Payer: Self-pay | Admitting: Cardiovascular Disease

## 2013-07-23 DIAGNOSIS — N5089 Other specified disorders of the male genital organs: Secondary | ICD-10-CM

## 2013-07-23 LAB — CBC
Hemoglobin: 10.1 g/dL — ABNORMAL LOW (ref 13.0–17.0)
MCH: 32.4 pg (ref 26.0–34.0)
MCHC: 35.9 g/dL (ref 30.0–36.0)
Platelets: 194 10*3/uL (ref 150–400)
RBC: 3.12 MIL/uL — ABNORMAL LOW (ref 4.22–5.81)
WBC: 9.1 10*3/uL (ref 4.0–10.5)

## 2013-07-23 LAB — BASIC METABOLIC PANEL
CO2: 25 mEq/L (ref 19–32)
Calcium: 8.4 mg/dL (ref 8.4–10.5)
Chloride: 99 mEq/L (ref 96–112)
Glucose, Bld: 114 mg/dL — ABNORMAL HIGH (ref 70–99)
Potassium: 4.2 mEq/L (ref 3.5–5.1)
Sodium: 133 mEq/L — ABNORMAL LOW (ref 135–145)

## 2013-07-23 LAB — PROTIME-INR
INR: 2.37 — ABNORMAL HIGH (ref 0.00–1.49)
Prothrombin Time: 25.1 seconds — ABNORMAL HIGH (ref 11.6–15.2)

## 2013-07-23 MED ORDER — POLYETHYLENE GLYCOL 3350 17 G PO PACK: 17.0000 g | PACK | Freq: Every day | ORAL | Status: AC

## 2013-07-23 MED ORDER — ACETAMINOPHEN 325 MG PO TABS: 650.0000 mg | ORAL_TABLET | Freq: Four times a day (QID) | ORAL | Status: AC | PRN

## 2013-07-23 MED ORDER — FENTANYL CITRATE 0.05 MG/ML IJ SOLN
25.0000 ug | Freq: Once | INTRAMUSCULAR | Status: AC
  Administered 2013-07-23: 25 ug via INTRAVENOUS
  Filled 2013-07-23: qty 2

## 2013-07-23 MED ORDER — ALLOPURINOL 100 MG PO TABS: 100.0000 mg | ORAL_TABLET | Freq: Every day | ORAL | Status: AC

## 2013-07-23 MED ORDER — FUROSEMIDE 40 MG PO TABS
40.0000 mg | ORAL_TABLET | ORAL | Status: DC
  Filled 2013-07-23: qty 1

## 2013-07-23 MED ORDER — FUROSEMIDE 20 MG PO TABS
20.0000 mg | ORAL_TABLET | Freq: Every day | ORAL | Status: DC
  Filled 2013-07-23: qty 1

## 2013-07-23 MED ORDER — ALBUTEROL SULFATE (5 MG/ML) 0.5% IN NEBU: 2.5000 mg | INHALATION_SOLUTION | Freq: Four times a day (QID) | RESPIRATORY_TRACT | Status: AC | PRN

## 2013-07-23 MED ORDER — PREGABALIN 25 MG PO CAPS: 25.0000 mg | ORAL_CAPSULE | Freq: Every day | ORAL | Status: DC

## 2013-07-23 MED ORDER — FUROSEMIDE 20 MG PO TABS
20.0000 mg | ORAL_TABLET | ORAL | Status: DC
  Administered 2013-07-23: 20 mg via ORAL
  Filled 2013-07-23: qty 1

## 2013-07-23 MED ORDER — SODIUM CHLORIDE 0.9 % IV SOLN
2.0000 g | Freq: Four times a day (QID) | INTRAVENOUS | Status: DC
  Administered 2013-07-23 – 2013-07-24 (×4): 2 g via INTRAVENOUS
  Filled 2013-07-23 (×5): qty 2000

## 2013-07-23 MED ORDER — SODIUM CHLORIDE 0.9 % IV SOLN: 2.0000 g | Freq: Four times a day (QID) | INTRAVENOUS | Status: AC

## 2013-07-23 MED ORDER — FUROSEMIDE 20 MG PO TABS: 20.0000 mg | ORAL_TABLET | ORAL | Status: DC

## 2013-07-23 MED ORDER — FUROSEMIDE 40 MG PO TABS: 40.0000 mg | ORAL_TABLET | ORAL | Status: DC

## 2013-07-23 MED ORDER — WARFARIN SODIUM 3 MG PO TABS
3.0000 mg | ORAL_TABLET | Freq: Once | ORAL | Status: AC
Start: 2013-07-23 — End: 2013-07-23
  Administered 2013-07-23: 3 mg via ORAL
  Filled 2013-07-23: qty 1

## 2013-07-23 NOTE — Progress Notes (Signed)
Spoke with patients wife on phone. Pt wife states that she does not want lyrica given until they speak with their primary doctor. This RN explained to patient wife that patient must tell us that he does not want lyrica given due to the fact that he is completely alert and oriented and capable of making own healthcare decisions at this time. Wife upset stating that she "has healthcare power of attorney and will sue the hospital if lyrica is given to her husband." Education unsuccessful on healthcare power of attorneys. Will pass along to night time RN and continue to monitor. Levonne Spiller, RN

## 2013-07-23 NOTE — Progress Notes (Signed)
INFECTIOUS DISEASE PROGRESS NOTE  ID: Jared Landry is a 72 y.o. male with  Active Problems:   Altered mental status  Subjective: C/o scrotal, genital swelling. Having difficulty urinating.   Abtx:  Anti-infectives   Start     Dose/Rate Route Frequency Ordered Stop   07/23/13 1800  ampicillin (OMNIPEN) 2 g in sodium chloride 0.9 % 50 mL IVPB     2 g 150 mL/hr over 20 Minutes Intravenous 4 times per day 07/23/13 1338     07/21/13 1200  ampicillin (OMNIPEN) 1 g in sodium chloride 0.9 % 50 mL IVPB  Status:  Discontinued     1 g 150 mL/hr over 20 Minutes Intravenous 4 times per day 07/21/13 1129 07/23/13 1338   07/20/13 0600  vancomycin (VANCOCIN) 1,250 mg in sodium chloride 0.9 % 250 mL IVPB  Status:  Discontinued    Comments:  Pharmacy may change dose   1,250 mg 166.7 mL/hr over 90 Minutes Intravenous Every 24 hours 07/19/13 0542 07/21/13 1129   07/19/13 0600  ceFEPIme (MAXIPIME) 2 g in dextrose 5 % 50 mL IVPB  Status:  Discontinued     2 g 100 mL/hr over 30 Minutes Intravenous Every 24 hours 07/19/13 0542 07/21/13 1129   07/19/13 0045  vancomycin (VANCOCIN) 2,000 mg in sodium chloride 0.9 % 500 mL IVPB     2,000 mg 250 mL/hr over 120 Minutes Intravenous  Once 07/19/13 0040 07/19/13 0425   07/19/13 0045  piperacillin-tazobactam (ZOSYN) IVPB 3.375 g     3.375 g 100 mL/hr over 30 Minutes Intravenous  Once 07/19/13 0040 07/19/13 0151   07/18/13 2330  cefTRIAXone (ROCEPHIN) 1 g in dextrose 5 % 50 mL IVPB     1 g 100 mL/hr over 30 Minutes Intravenous  Once 07/18/13 2317 07/19/13 0015      Medications:  Scheduled: . allopurinol  300 mg Oral Daily  . amiodarone  200 mg Oral Daily  . ampicillin (OMNIPEN) IV  2 g Intravenous Q6H  . aspirin EC  81 mg Oral Daily  . atorvastatin  5 mg Oral Daily  . furosemide  20 mg Oral QODAY  . [START ON 07/24/2013] furosemide  40 mg Oral QODAY  . levothyroxine  50 mcg Oral QAC breakfast  . polyethylene glycol  17 g Oral Daily  . pregabalin   25 mg Oral QHS  . warfarin  3 mg Oral ONCE-1800  . Warfarin - Pharmacist Dosing Inpatient   Does not apply q1800    Objective: Vital signs in last 24 hours: Temp:  [98 F (36.7 C)-98.3 F (36.8 C)] 98.3 F (36.8 C) (12/17 1345) Pulse Rate:  [72-73] 72 (12/17 1345) Resp:  [18] 18 (12/17 1345) BP: (114-130)/(48-75) 114/48 mmHg (12/17 1345) SpO2:  [97 %] 97 % (12/17 1345)   General appearance: alert, cooperative and no distress Resp: clear to auscultation bilaterally Cardio: regular rate and rhythm GI: normal findings: bowel sounds normal and soft, non-tender Male genitalia: abnormal findings: significant edema  Lab Results  Recent Labs  07/21/13 0401 07/22/13 0345 07/23/13 0433  WBC 8.5  --  9.1  HGB 9.4*  --  10.1*  HCT 25.5*  --  28.1*  NA 129* 131* 133*  K 4.2 4.3 4.2  CL 94* 97 99  CO2 23 25 25   BUN 47* 40* 33*  CREATININE 1.80* 1.53* 1.23   Liver Panel No results found for this basename: PROT, ALBUMIN, AST, ALT, ALKPHOS, BILITOT, BILIDIR, IBILI,  in the last  72 hours Sedimentation Rate No results found for this basename: ESRSEDRATE,  in the last 72 hours C-Reactive Protein No results found for this basename: CRP,  in the last 72 hours  Microbiology: Recent Results (from the past 240 hour(s))  CULTURE, BLOOD (ROUTINE X 2)     Status: None   Collection Time    07/18/13 10:30 PM      Result Value Range Status   Specimen Description BLOOD RIGHT ARM   Final   Special Requests BOTTLES DRAWN AEROBIC AND ANAEROBIC 10CC   Final   Culture  Setup Time     Final   Value: 07/19/2013 10:58     Performed at [facility]   Culture     Final   Value: ENTEROCOCCUS SPECIES     Note: COMBINATION THERAPY OF HIGH DOSE AMPICILLIN OR VANCOMYCIN, PLUS AN AMINOGLYCOSIDE, IS USUALLY INDICATED FOR SERIOUS ENTEROCOCCAL INFECTIONS.     Note: Gram Stain Report Called to,Read Back By and Verified With: Juanda Bond RN 07/19/13 2124PM  MITCV     Performed at [facility]   Report Status 07/21/2013 FINAL   Final   Organism ID, Bacteria ENTEROCOCCUS SPECIES   Final  CULTURE, BLOOD (ROUTINE X 2)     Status: None   Collection Time    07/18/13 10:35 PM      Result Value Range Status   Specimen Description BLOOD RIGHT FOREARM   Final   Special Requests BOTTLES DRAWN AEROBIC ONLY 10CC   Final   Culture  Setup Time     Final   Value: 07/19/2013 10:58     Performed at [facility]   Culture     Final   Value: ENTEROCOCCUS SPECIES     Note: SUSCEPTIBILITIES PERFORMED ON PREVIOUS CULTURE WITHIN THE LAST 5 DAYS.     Note: Gram Stain Report Called to,Read Back By and Verified With: JAMES ARTIS  07/19/13 2124PM [facility]     Performed at [facility]   Report Status 07/21/2013 FINAL   Final  URINE CULTURE     Status: None   Collection Time    07/18/13 11:44 PM      Result Value Range Status   Specimen Description URINE, CATHETERIZED   Final   Special Requests NONE   Final   Culture  Setup Time     Final   Value: 07/19/2013 11:39     Performed at [facility]   Colony Count     Final   Value: NO GROWTH     Performed at [facility]   Culture     Final   Value: NO GROWTH     Performed at [facility]   Report Status 07/20/2013 FINAL   Final  MRSA PCR SCREENING     Status: None   Collection Time    07/19/13  5:28 AM      Result Value Range Status   MRSA by PCR NEGATIVE  NEGATIVE Final   Comment:            The GeneXpert MRSA Assay (FDA     approved for NASAL specimens     only), is one component of a     comprehensive MRSA colonization     surveillance program. It is not     intended to diagnose MRSA     infection nor to guide or     monitor treatment for     MRSA infections.  CULTURE,  BLOOD (ROUTINE X 2)     Status: None   Collection Time    07/21/13 12:30 PM      Result Value Range Status   Specimen Description BLOOD ARM LEFT   Final   Special Requests     Final   Value: BOTTLES DRAWN AEROBIC AND  ANAEROBIC BLUE 10CC RED 7CC   Culture  Setup Time     Final   Value: 07/21/2013 16:52     Performed at [facility]   Culture     Final   Value:        BLOOD CULTURE RECEIVED NO GROWTH TO DATE CULTURE WILL BE HELD FOR 5 DAYS BEFORE ISSUING A FINAL NEGATIVE REPORT     Performed at [facility]   Report Status PENDING   Incomplete    Studies/Results: Ir US Guide Vasc Access Right  07/22/2013   CLINICAL DATA:  Patient with bacteremia, need PICC for antibiotics, patient denies any history of kidney dysfunction.  EXAM: Right dual lumen power injectable PICC LINE PLACEMENT WITH ULTRASOUND AND FLUOROSCOPIC GUIDANCE  FLUOROSCOPY TIME:  0.3 minutes  PROCEDURE: The patient was advised of the possible risks and complications and agreed to undergo the procedure. The patient was then brought to the angiographic suite for the procedure.  The right arm was prepped with chlorhexidine, draped in the usual sterile fashion using maximum barrier technique (cap and mask, sterile gown, sterile gloves, large sterile sheet, hand hygiene and cutaneous antisepsis) and infiltrated locally with 1% Lidocaine.  Ultrasound demonstrated patency of the right basilic vein, and this was documented with an image. Under real-time ultrasound guidance, this vein was accessed with a 21 gauge micropuncture needle and image documentation was performed. A 0.018 wire was introduced in to the vein. Over this, a 5 [place] double lumen power-injectable PICC was advanced to the lower SVC/right atrial junction. Fluoroscopy during the procedure and fluoro spot radiograph confirms appropriate catheter position. The catheter was flushed and covered with a sterile dressing.  Complications: none.  IMPRESSION: Successful right arm power injectable dual lumen PICC line placement with ultrasound and fluoroscopic guidance. The catheter is ready for use.  Read By:  Pattricia Boss PA-C   Electronically Signed   By: Irish Lack M.D.   On:  07/22/2013 13:15   Ir Fluoro Guide Cv Midline Picc Right  07/22/2013   CLINICAL DATA:  Patient with bacteremia, need PICC for antibiotics, patient denies any history of kidney dysfunction.  EXAM: Right dual lumen power injectable PICC LINE PLACEMENT WITH ULTRASOUND AND FLUOROSCOPIC GUIDANCE  FLUOROSCOPY TIME:  0.3 minutes  PROCEDURE: The patient was advised of the possible risks and complications and agreed to undergo the procedure. The patient was then brought to the angiographic suite for the procedure.  The right arm was prepped with chlorhexidine, draped in the usual sterile fashion using maximum barrier technique (cap and mask, sterile gown, sterile gloves, large sterile sheet, hand hygiene and cutaneous antisepsis) and infiltrated locally with 1% Lidocaine.  Ultrasound demonstrated patency of the right basilic vein, and this was documented with an image. Under real-time ultrasound guidance, this vein was accessed with a 21 gauge micropuncture needle and image documentation was performed. A 0.018 wire was introduced in to the vein. Over this, a 5 [place] double lumen power-injectable PICC was advanced to the lower SVC/right atrial junction. Fluoroscopy during the procedure and fluoro spot radiograph confirms appropriate catheter position. The catheter was flushed and covered with a sterile dressing.  Complications:  none.  IMPRESSION: Successful right arm power injectable dual lumen PICC line placement with ultrasound and fluoroscopic guidance. The catheter is ready for use.  Read By:  Pattricia Boss PA-C   Electronically Signed   By: Irish Lack M.D.   On: 07/22/2013 13:15     Assessment/Plan: Enterococcal Bacteremia  Defibrillator  DM2 Scrotal swelling  Total days of antibiotics: 5 ampicillin  TEE (-) for vegitation on valves or leads.  Would plan for 4 weeks of ampicillin. Will need repeat Bcx 1 week after completion of anbx Repeat BCx 12-15 are ngtd.  Needs scrotal elevation, try to  avoid foley.  Available if questions.  Will have him seen in ID clinic in f/u          Johny Sax Infectious Diseases (pager) [phone] [email or web address] 07/23/2013, 4:34 PM  LOS: 5 days

## 2013-07-23 NOTE — Progress Notes (Signed)
Patient ID: Jared Landry, male   DOB: 02-18-1941, 72 y.o.   MRN: [id number]  Subjective:  Improved markedly. Mental status seems at baseline. Denies chest pain or SOB.  Objective:  Vital Signs in the last 24 hours: Temp:  [98 F (36.7 C)-98.7 F (37.1 C)] 98 F (36.7 C) (12/16 2120) Pulse Rate:  [73-78] 73 (12/16 2120) Resp:  [13-20] 18 (12/16 2120) BP: (117-140)/(60-78) 130/75 mmHg (12/16 2120) SpO2:  [94 %-99 %] 97 % (12/16 2120)  Intake/Output from previous day: 12/16 0701 - 12/17 0700 In: 0  Out: 1150 [Urine:1150] Intake/Output from this shift:    Physical Exam: Gen: Well appearing 71 yo man in NAD HEENT: Unremarkable Neck:  No JVD Lungs: CTA bilaterally, no wheezes rales or rhonchi HEART: Regular rate rhythm, no murmur, rub or gallop Abd: Soft, nondistended Ext: No cyanosis, clubbing or edema Neuro: Alert and oriented, no focal deficits  Current Meds: . allopurinol  300 mg Oral Daily  . amiodarone  200 mg Oral Daily  . ampicillin (OMNIPEN) IV  1 g Intravenous Q6H  . aspirin EC  81 mg Oral Daily  . atorvastatin  5 mg Oral Daily  . furosemide  20 mg Oral Daily  . levothyroxine  50 mcg Oral QAC breakfast  . polyethylene glycol  17 g Oral Daily  . polyethylene glycol  17 g Oral Daily  . pregabalin  25 mg Oral QHS  . Warfarin - Pharmacist Dosing Inpatient   Does not apply q1800    Lab Results:  Recent Labs  07/21/13 0401 07/23/13 0433  WBC 8.5 9.1  HGB 9.4* 10.1*  PLT 174 194    Recent Labs  07/22/13 0345 07/23/13 0433  NA 131* 133*  K 4.3 4.2  CL 97 99  CO2 25 25  GLUCOSE 120* 114*  BUN 40* 33*  CREATININE 1.53* 1.23   No results found for this basename: TROPONINI, CK, MB,  in the last 72 hours No results found for this basename: PROT, ALBUMIN, AST, ALT, ALKPHOS, BILITOT, BILIDIR, IBILI,  in the last 72 hours  Imaging: Ir US Guide Vasc Access Right  07/22/2013   CLINICAL DATA:  Patient with bacteremia, need PICC for antibiotics,  patient denies any history of kidney dysfunction.  EXAM: Right dual lumen power injectable PICC LINE PLACEMENT WITH ULTRASOUND AND FLUOROSCOPIC GUIDANCE  FLUOROSCOPY TIME:  0.3 minutes  PROCEDURE: The patient was advised of the possible risks and complications and agreed to undergo the procedure. The patient was then brought to the angiographic suite for the procedure.  The right arm was prepped with chlorhexidine, draped in the usual sterile fashion using maximum barrier technique (cap and mask, sterile gown, sterile gloves, large sterile sheet, hand hygiene and cutaneous antisepsis) and infiltrated locally with 1% Lidocaine.  Ultrasound demonstrated patency of the right basilic vein, and this was documented with an image. Under real-time ultrasound guidance, this vein was accessed with a 21 gauge micropuncture needle and image documentation was performed. A 0.018 wire was introduced in to the vein. Over this, a 5 [place] double lumen power-injectable PICC was advanced to the lower SVC/right atrial junction. Fluoroscopy during the procedure and fluoro spot radiograph confirms appropriate catheter position. The catheter was flushed and covered with a sterile dressing.  Complications: none.  IMPRESSION: Successful right arm power injectable dual lumen PICC line placement with ultrasound and fluoroscopic guidance. The catheter is ready for use.  Read By:  Pattricia Boss PA-C   Electronically Signed  By: Irish Lack M.D.   On: 07/22/2013 13:15   Ir Fluoro Guide Cv Midline Picc Right  07/22/2013   CLINICAL DATA:  Patient with bacteremia, need PICC for antibiotics, patient denies any history of kidney dysfunction.  EXAM: Right dual lumen power injectable PICC LINE PLACEMENT WITH ULTRASOUND AND FLUOROSCOPIC GUIDANCE  FLUOROSCOPY TIME:  0.3 minutes  PROCEDURE: The patient was advised of the possible risks and complications and agreed to undergo the procedure. The patient was then brought to the angiographic suite  for the procedure.  The right arm was prepped with chlorhexidine, draped in the usual sterile fashion using maximum barrier technique (cap and mask, sterile gown, sterile gloves, large sterile sheet, hand hygiene and cutaneous antisepsis) and infiltrated locally with 1% Lidocaine.  Ultrasound demonstrated patency of the right basilic vein, and this was documented with an image. Under real-time ultrasound guidance, this vein was accessed with a 21 gauge micropuncture needle and image documentation was performed. A 0.018 wire was introduced in to the vein. Over this, a 5 [place] double lumen power-injectable PICC was advanced to the lower SVC/right atrial junction. Fluoroscopy during the procedure and fluoro spot radiograph confirms appropriate catheter position. The catheter was flushed and covered with a sterile dressing.  Complications: none.  IMPRESSION: Successful right arm power injectable dual lumen PICC line placement with ultrasound and fluoroscopic guidance. The catheter is ready for use.  Read By:  Pattricia Boss PA-C   Electronically Signed   By: Irish Lack M.D.   On: 07/22/2013 13:15    Cardiac Studies: Tele - AV paced Device interrogation - this AM performed by industry - normal BiV ICD function  Assessment/Plan:  1. AMS/sepsis syndrome  - urosepsis; improving; per primary team. Hopefully enterococcal sepsis can be cleared. No vegetation by TEE. Anti-biotic therapy as per ID service. 2. Ischemic CM with chronic systolic HF - stable; not volume overloaded currently; CRT-D functioning normally; resume medical therapy as BP allows 3. PAF - stable; currently AV paced; continue warfarin for stroke prevention 4. CAD - stable without anginal symptoms; continue medical therapy; followed by Dr Anne Fu 5. CKD 6. COPD 7. Disp. - please call for questions. If he were to have recurrent bacteremia, ICD system extraction would be indicated. Will sign off for now.  Leonia Reeves.D.

## 2013-07-23 NOTE — Progress Notes (Signed)
Subjective: Pt c/o leg pain Left arm dependent edema TEE- negative.  Objective: Vital signs in last 24 hours: Temp:  [98 F (36.7 C)-98.7 F (37.1 C)] 98 F (36.7 C) (12/16 2120) Pulse Rate:  [73-78] 73 (12/16 2120) Resp:  [13-20] 18 (12/16 2120) BP: (117-140)/(60-78) 130/75 mmHg (12/16 2120) SpO2:  [94 %-99 %] 97 % (12/16 2120) Weight change:  Last BM Date: 07/19/13  Intake/Output from previous day: 12/16 0701 - 12/17 0700 In: 0  Out: 1150 [Urine:1150] Intake/Output this shift:    General appearance: alert Resp: clear to auscultation bilaterally Cardio: regular rate and rhythm Extremities: extremities normal, atraumatic, no cyanosis or edema  Lab Results:  Recent Labs  07/21/13 0401 07/23/13 0433  WBC 8.5 9.1  HGB 9.4* 10.1*  HCT 25.5* 28.1*  PLT 174 194   BMET  Recent Labs  07/22/13 0345 07/23/13 0433  NA 131* 133*  K 4.3 4.2  CL 97 99  CO2 25 25  GLUCOSE 120* 114*  BUN 40* 33*  CREATININE 1.53* 1.23  CALCIUM 8.5 8.4    Studies/Results: Ir US Guide Vasc Access Right  07/22/2013   CLINICAL DATA:  Patient with bacteremia, need PICC for antibiotics, patient denies any history of kidney dysfunction.  EXAM: Right dual lumen power injectable PICC LINE PLACEMENT WITH ULTRASOUND AND FLUOROSCOPIC GUIDANCE  FLUOROSCOPY TIME:  0.3 minutes  PROCEDURE: The patient was advised of the possible risks and complications and agreed to undergo the procedure. The patient was then brought to the angiographic suite for the procedure.  The right arm was prepped with chlorhexidine, draped in the usual sterile fashion using maximum barrier technique (cap and mask, sterile gown, sterile gloves, large sterile sheet, hand hygiene and cutaneous antisepsis) and infiltrated locally with 1% Lidocaine.  Ultrasound demonstrated patency of the right basilic vein, and this was documented with an image. Under real-time ultrasound guidance, this vein was accessed with a 21 gauge micropuncture  needle and image documentation was performed. A 0.018 wire was introduced in to the vein. Over this, a 5 [place] double lumen power-injectable PICC was advanced to the lower SVC/right atrial junction. Fluoroscopy during the procedure and fluoro spot radiograph confirms appropriate catheter position. The catheter was flushed and covered with a sterile dressing.  Complications: none.  IMPRESSION: Successful right arm power injectable dual lumen PICC line placement with ultrasound and fluoroscopic guidance. The catheter is ready for use.  Read By:  Pattricia Boss PA-C   Electronically Signed   By: Irish Lack M.D.   On: 07/22/2013 13:15   Ir Fluoro Guide Cv Midline Picc Right  07/22/2013   CLINICAL DATA:  Patient with bacteremia, need PICC for antibiotics, patient denies any history of kidney dysfunction.  EXAM: Right dual lumen power injectable PICC LINE PLACEMENT WITH ULTRASOUND AND FLUOROSCOPIC GUIDANCE  FLUOROSCOPY TIME:  0.3 minutes  PROCEDURE: The patient was advised of the possible risks and complications and agreed to undergo the procedure. The patient was then brought to the angiographic suite for the procedure.  The right arm was prepped with chlorhexidine, draped in the usual sterile fashion using maximum barrier technique (cap and mask, sterile gown, sterile gloves, large sterile sheet, hand hygiene and cutaneous antisepsis) and infiltrated locally with 1% Lidocaine.  Ultrasound demonstrated patency of the right basilic vein, and this was documented with an image. Under real-time ultrasound guidance, this vein was accessed with a 21 gauge micropuncture needle and image documentation was performed. A 0.018 wire was introduced in to the vein.  Over this, a 5 [place] double lumen power-injectable PICC was advanced to the lower SVC/right atrial junction. Fluoroscopy during the procedure and fluoro spot radiograph confirms appropriate catheter position. The catheter was flushed and covered with a sterile  dressing.  Complications: none.  IMPRESSION: Successful right arm power injectable dual lumen PICC line placement with ultrasound and fluoroscopic guidance. The catheter is ready for use.  Read By:  Pattricia Boss PA-C   Electronically Signed   By: Irish Lack M.D.   On: 07/22/2013 13:15    Medications: I have reviewed the patient's current medications.  Assessment/Plan: Sepsis/Bacteremia - UTI source; blood culture enteroccous- Pan sensitivity- TEE negative ID - consulted- will need Prolong ABX 4-6 week- PICC line in place ABx- Ampicillin 4 times- duration per ID CHF Chronic- Will resume lasix 40 mg alternate with 20 mg in am - BP better now  PT consult-noted- rec SNF  DM- with neuropathy- some leg pain - start on lyrica 25 mg qhs CKD stage 3 - cr stable at baseline  COPD stable- neb/ advair PT/OT Rehab-SNF in am   LOS: 5 days   Jared Landry 07/23/2013, 7:41 AM

## 2013-07-23 NOTE — Progress Notes (Signed)
OT Cancellation Note  Patient Details Name: Jared Landry MRN: [id number] DOB: 1941-03-31   Cancelled Treatment:    Reason Eval/Treat Not Completed:  Pt with plans for d/c to SNF tomorrow, will defer OT evaluation and treatment to SNF.  Evern Bio 07/23/2013, 1:04 PM

## 2013-07-23 NOTE — Discharge Summary (Signed)
Physician Discharge Summary  Patient ID: UNO ESAU MRN: [id number] DOB/AGE: 73-Jan-1942 73 y.o.  Admit date: 07/18/2013 Discharge date: 07/24/2013  Admission Diagnoses:  Discharge Diagnoses:  Active Problems: Sepsis Enterococcus bacteremia Hypotension due to sepsis   Altered mental status-acute encephalopathy metabolic Deconditioning/weakness Edema scrotal History of chronic systolic heart failure Ischemic cardiomyopathy Chronic kidney disease stage III Acute on chronic renal failure History of AICD History of atrial fibrillation COPD Type 2 diabetes Diabetic neuropathy/leg discomfort Hypothyroidism Gait abnormality History of gouty arthritis Osteoarthritis Chronic anticoagulation    Discharged Condition: good  Hospital Course: 72 years old admitted from the nursing rehabilitation facility where he was getting rehabilitation for weakness and deconditioning was improving, episode of hypotension, mental status changes brought to the emergency room: Problem #1: acute encephalopathy/sepsis shock: patient was volume resuscitated; found to have elevated white count of 20,000; urine was positive for UTI; patient was initially admitted to ICU started on broad-spectrum antibiotic vancomycin and cefepime; blood cultures grew out enterococcus. Patient white count improved 9.1, his chest x-ray was no acute findings. Patient had cardiology consultation because of the AICD; he had 2-D echo which was negative for vegetation; underwent TEE-also negative for vegetation. Infectious disease was consulted; because of the bacteremia-recommended antibiotic for 4 weeks; enterococcus  Pan-sensitivity-antibiotic switched to ampicillin-2 g 4 times a day IV; double-lumen PICC line was placed on the right arm. Patient will continue for antibiotics for 4 weeks; repeat blood cultures done in the hospital was negative We'll follow up outpatient with ID as well as blood cultures one week after the  antibiotics. Patient remained afebrile. Problem #2: chronic systolic heart failure his BNP was elevated troponin was negative; cardiology did not think it was an acute CHF. Clinically he remained stable, his Lasix was restarted back after fluid resuscitation. Hemodynamically stable AICD interrogation normal functioning Problem #3 acute on chronic renal failure baseline creatinine of 1.6-1.7; ultrasound renal negative for obstruction. Problem #4: by 2 diabetes diet control: follow CBGs Problem #5 diabetic neuropathy: some discomfort in the legs: Tylenol p.r.n. Problem #6 COPD continue on Advair and nebulizers p.r.n. Problem #7: gouty arthritis continue allopurinol 400 mg daily and colchicine. Problem #8: left arm edema dependent-elevation of the left arm; also noted to have scrotal edema-elevation of the scrotum; Lasix was restarted Problem #9: chronic anticoagulation continue Coumadin INR 2.3- patient takes 4 mg every day except 2 mg on Mondays and Fridays follow INR closely while on antibiotics. Right arm PICC line care/    Consults: cardiology, pulmonary/intensive care and ID  Significant Diagnostic Studies: labs: WBC 9.1 hemoglobin 10.1 pressure 4.2 sodium 133 creatinine between 1.2-1.6 HIV negative TSH 3.3 troponins negative magnesium 2.2, microbiology: blood culture: positive for enterococcus and urine culture: negative and radiology: CXR: cardiomegaly and Ultrasound: renal ultrasound negative  Treatments: IV hydration, antibiotics: Ancef and vancomycin and anticoagulation: warfarin  Discharge Exam: Blood pressure 114/48, pulse 72, temperature 98.3 F (36.8 C), temperature source Oral, resp. rate 18, height 5\' 8"  (1.727 m), weight 93.9 kg (207 lb 0.2 oz), SpO2 97.00%. General appearance: alert Resp: clear to auscultation bilaterally Cardio: regular rate and rhythm  Disposition: 03-Skilled Nursing Facility   Future Appointments Provider Department Dept Phone   08/05/2013 11:30 AM  Herby Abraham Beverly Sessions [facility] [place] Office [phone]   09/16/2013 10:00 AM Donato Schultz, MD [facility] [phone]       Medication List         acetaminophen 325 MG tablet  Commonly known as:  TYLENOL  Take 2 tablets (650 mg total) by mouth every 6 (six) hours as needed for mild pain or fever.     albuterol 108 (90 BASE) MCG/ACT inhaler  Commonly known as:  PROVENTIL HFA;VENTOLIN HFA  Inhale 2 puffs into the lungs every 6 (six) hours as needed. For shortness of breath.     albuterol (5 MG/ML) 0.5% nebulizer solution  Commonly known as:  PROVENTIL  Take 0.5 mLs (2.5 mg total) by nebulization every 6 (six) hours as needed for wheezing or shortness of breath.     allopurinol 100 MG tablet  Commonly known as:  ZYLOPRIM  Take 1 tablet (100 mg total) by mouth daily. Along with 300 mg - total dose of 400 mg of Allopurinol daily.     allopurinol 300 MG tablet  Commonly known as:  ZYLOPRIM  Take 300 mg by mouth daily.     amiodarone 200 MG tablet  Commonly known as:  PACERONE  Take 1 tablet (200 mg total) by mouth daily.     aspirin EC 81 MG tablet  Take 81 mg by mouth daily.     Astaxanthin 4 MG Caps  Take 1 capsule by mouth daily.     atorvastatin 10 MG tablet  Commonly known as:  LIPITOR  Take 5 mg by mouth daily.     b complex vitamins tablet  Take 1 tablet by mouth every other day. Alternate with Zinc     cholecalciferol 1000 UNITS tablet  Commonly known as:  VITAMIN D  Take 2,000 Units by mouth every other day.     Co Q-10 100 MG Caps  Take 1 tablet by mouth daily. Alternate with Magnesium     colchicine 0.6 MG tablet  Take 1 tablet (0.6 mg total) by mouth daily.     Fluticasone-Salmeterol 100-50 MCG/DOSE Aepb  Commonly known as:  ADVAIR  Inhale 1 puff into the lungs 2 (two) times daily as needed.     furosemide 20 MG tablet  Commonly known as:  LASIX  Take 1 tablet (20 mg total) by mouth every other day. (20 mg alternate  with 40 mg daily)     furosemide 40 MG tablet  Commonly known as:  LASIX  Take 1 tablet (40 mg total) by mouth every other day. ( 20 mg alternate with 40 mg daily)  Start taking on:  07/24/2013     levothyroxine 50 MCG tablet  Commonly known as:  SYNTHROID, LEVOTHROID  Take 1 tablet (50 mcg total) by mouth daily before breakfast.     Magnesium 300 MG Caps  Take 1 capsule by mouth every other day.     metoprolol succinate 100 MG 24 hr tablet  Commonly known as:  TOPROL-XL  Take 100 mg by mouth daily. Take with or immediately following a meal.     polyethylene glycol packet  Commonly known as:  MIRALAX / GLYCOLAX  Take 17 g by mouth daily.     sodium chloride 0.9 % SOLN 50 mL with ampicillin 2 G SOLR 2 g  Inject 2 g into the vein every 6 (six) hours. FOR TOTAL OF 4 WEEKS     warfarin 4 MG tablet  Commonly known as:  COUMADIN  1 tablet ( 4 mg) daily; except 1/2 tablet ( 2 mg) on mondays and fridays     Zinc 100 MG Tabs  Take 1 tablet by mouth every other day. Alternate with B Complex  Follow-up Information   Follow up with Georgann Housekeeper, MD.   Specialty:  Internal Medicine   Contact information:   [address], [address] [place] [address] [phone]     discharge planning time total 45 minutes  Signed: Georgann Housekeeper 07/23/2013, 8:27 PM

## 2013-07-23 NOTE — Progress Notes (Signed)
Physical Therapy Treatment Patient Details Name: Jared Landry MRN: [id number] DOB: Dec 21, 1940 Today's Date: 07/23/2013 Time: 4098-1191 PT Time Calculation (min): 40 min  PT Assessment / Plan / Recommendation  History of Present Illness Pt adm from SNF with AMS and urosepsis.  Pt with recent admission with fever and gout. Hx of afib, CAD, DM2, gout, HTN, ischemic cardiomyopathy, s/p IVCD.  TEE was negative for cardiac infection, infection suspected from UTI.  Pt plans to d/c tomorrow to SNF.    PT Comments   Pt admitted with above. Patient feels weak and his functional status has greatly decreased over his hospital stay. Pt currently with functional limitations in mobility and ambulation. Pt will benefit from skilled PT to increase their independence and safety with mobility to allow discharge to the venue listed below. 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   Follow Up Recommendations  SNF           Equipment Recommendations  None recommended by PT    Recommendations for Other Services Other (comment)  Frequency Min 2X/week   Progress towards PT Goals Progress towards PT goals: Progressing toward goals  Plan Frequency needs to be updated    Precautions / Restrictions Precautions Precautions: Fall Restrictions Weight Bearing Restrictions: No   Pertinent Vitals/Pain On room air, spO2 monitored throughout session at rest, during sitting, walking, and rest break.  SpO2 consistently at 96%.    Mobility  Bed Mobility Bed Mobility: Supine to Sit Supine to Sit: 3: Mod assist Details for Bed Mobility Assistance: He is able to pull himself into a roll when using the guard rails with the HOB elevated.  He is able to move his legs, but requires A to get them to EOB.  Once on his side in a log roll, requires bilateral UE to pull into upright sitting and to help with trunk control. Transfers Transfers: Sit to Stand;Stand to Sit Sit to  Stand: 2: Max assist;With upper extremity assist;From bed Stand to Sit: To chair/3-in-1;Without upper extremity assist;3: Mod assist Details for Transfer Assistance:  He can push through both UE on bed to assist in a small way. Requires B support of pad underneath hip to pull his hips up into extension and achieve upright standing.  Also need to block both knees as he stands to prevent buckling Ambulation/Gait Ambulation/Gait Assistance: 3: Mod assist Ambulation Distance (Feet): 20 Feet Assistive device: Rolling walker Ambulation/Gait Assistance Details: Requires trunk support as he takes small choppy steps.  He requires a standing break x 1 min because he 'was feeling woozy'.  His o2 sats were 96% and stable throughout session and during his break.   Gait Pattern: Step-through pattern;Decreased stride length Gait velocity: decreased Stairs: No      PT Goals (current goals can now be found in the care plan section) Acute Rehab PT Goals Patient Stated Goal: Get stronger PT Goal Formulation: With patient Time For Goal Achievement: 07/28/13 Potential to Achieve Goals: Good  Visit Information  Last PT Received On: 07/23/13 History of Present Illness: Pt adm from SNF with AMS and urosepsis.  Pt with recent admission with fever and gout. Hx of afib, CAD, DM2, gout, HTN, ischemic cardiomyopathy, s/p IVCD.  TEE was negative for cardiac infection, infection suspected from UTI.  Pt plans to d/c tomorrow to SNF.     Subjective Data  Subjective: Feeling weak Patient Stated Goal: Get stronger   Cognition  Cognition Arousal/Alertness: Awake/alert Behavior  During Therapy: WFL for tasks assessed/performed Overall Cognitive Status: Within Functional Limits for tasks assessed (Low affect, not happy to go back to SNF)       End of Session PT - End of Session Equipment Utilized During Treatment: Gait belt Activity Tolerance: Patient limited by fatigue Patient left: in chair;with call bell/phone  within reach Nurse Communication: Mobility status   GP    Barrie Dunker, SPT Pager:  [phone]  Barrie Dunker 07/23/2013, 12:50 PM

## 2013-07-23 NOTE — Progress Notes (Signed)
ANTICOAGULATION CONSULT NOTE - Follow-Up Consult  Pharmacy Consult for Coumadin Indication: atrial fibrillation  Allergies  Allergen Reactions  . Gabapentin Other (See Comments)    May cause tremors and shaking    Patient Measurements: Height: 5\' 8"  (172.7 cm) Weight: 207 lb 0.2 oz (93.9 kg) IBW/kg (Calculated) : 68.4 Heparin Dosing Weight: n/a  Vital Signs: Temp: 98 F (36.7 C) (12/16 2120) BP: 130/75 mmHg (12/16 2120) Pulse Rate: 73 (12/16 2120)  Labs:  Recent Labs  07/21/13 0401 07/22/13 0345 07/23/13 0433  HGB 9.4*  --  10.1*  HCT 25.5*  --  28.1*  PLT 174  --  194  LABPROT 21.8* 22.9* 25.1*  INR 1.97* 2.10* 2.37*  CREATININE 1.80* 1.53* 1.23    Estimated Creatinine Clearance: 59.5 ml/min (by C-G formula based on Cr of 1.23).  Assessment: 72 yo male on chronic Coumadin for afib admitted for AMS and hypotension.  INR remains therapeutic at 2.37 and continues to trend up. No bleeding or complications noted per chart notes. CBC is stable.  FYI- Per SNF records,  PTA dose was formerly = 4 mg daily except 2 mg on Mon/Fridays.  SNF MAR hard to interpret, but I think this was changed to 3 mg daily on 12/5.  INR = 4.76 on 12/5 per SNF records, 3.47 on 12/8, 2.54 on 12/9 and 1.84 on 12/11.  Unsure exactly how these INR changes correlate to his dosing. Spoke to wife, she said morning of admission he had a nosebleed.  When last seen at [facility] on 06/23/13 his dose was 2.5 mg daily except 1.25 mg on Mon/Fri.  Do not believe he was on amiodarone at that time.  Goal of Therapy:  INR 2-3   Plan:  1. Will reduce coumadin dose slightly to 3mg  tonight due to INR continuing to trend up 2. F/u AM INR  Lysle Pearl, PharmD, BCPS Pager # [phone] 07/23/2013 8:30 AM

## 2013-07-23 NOTE — Progress Notes (Signed)
Read, reviewed, and agree.  Flor Whitacre B. Claudina Oliphant, PT, DPT #[phone]  

## 2013-07-24 LAB — BASIC METABOLIC PANEL
CO2: 26 mEq/L (ref 19–32)
Calcium: 8.6 mg/dL (ref 8.4–10.5)
Chloride: 101 mEq/L (ref 96–112)
GFR calc Af Amer: 76 mL/min — ABNORMAL LOW (ref >90)
Glucose, Bld: 96 mg/dL (ref 70–99)
Potassium: 4.6 mEq/L (ref 3.5–5.1)
Sodium: 134 mEq/L — ABNORMAL LOW (ref 135–145)

## 2013-07-24 LAB — PROTIME-INR
INR: 2.45 — ABNORMAL HIGH (ref 0.00–1.49)
Prothrombin Time: 25.8 seconds — ABNORMAL HIGH (ref 11.6–15.2)

## 2013-07-24 MED ORDER — HEPARIN SOD (PORK) LOCK FLUSH 100 UNIT/ML IV SOLN
250.0000 [IU] | INTRAVENOUS | Status: DC | PRN
  Filled 2013-07-24: qty 3

## 2013-07-24 MED ORDER — HEPARIN SOD (PORK) LOCK FLUSH 100 UNIT/ML IV SOLN
250.0000 [IU] | INTRAVENOUS | Status: AC | PRN
  Administered 2013-07-24: 11:00:00

## 2013-07-24 MED ORDER — HEPARIN SOD (PORK) LOCK FLUSH 100 UNIT/ML IV SOLN
250.0000 [IU] | Freq: Every day | INTRAVENOUS | Status: DC
  Filled 2013-07-24: qty 3

## 2013-07-24 NOTE — Progress Notes (Signed)
CSW (Clinical Social Worker) prepared pt dc packet and placed with shadow chart. CSW arranged non-emergent ambulance transport at 1pm. Pt, pt family, pt nurse, and facility informed. CSW signing off.   Ainsleigh Kakos, LCSWA [phone]  

## 2013-07-27 LAB — CULTURE, BLOOD (ROUTINE X 2): Culture: NO GROWTH

## 2013-08-04 ENCOUNTER — Ambulatory Visit (INDEPENDENT_AMBULATORY_CARE_PROVIDER_SITE_OTHER): Payer: Medicare Other | Admitting: *Deleted

## 2013-08-04 ENCOUNTER — Encounter: Payer: Self-pay | Admitting: Internal Medicine

## 2013-08-04 DIAGNOSIS — I5022 Chronic systolic (congestive) heart failure: Secondary | ICD-10-CM

## 2013-08-04 DIAGNOSIS — I255 Ischemic cardiomyopathy: Secondary | ICD-10-CM

## 2013-08-04 DIAGNOSIS — I2589 Other forms of chronic ischemic heart disease: Secondary | ICD-10-CM | POA: Diagnosis not present

## 2013-08-04 DIAGNOSIS — I4891 Unspecified atrial fibrillation: Secondary | ICD-10-CM

## 2013-08-04 DIAGNOSIS — I472 Ventricular tachycardia: Secondary | ICD-10-CM

## 2013-08-04 LAB — MDC_IDC_ENUM_SESS_TYPE_INCLINIC
Brady Statistic RV Percent Paced: 97 %
Date Time Interrogation Session: 20141229152230
HighPow Impedance: 61.875
Lead Channel Impedance Value: 412.5 Ohm
Lead Channel Pacing Threshold Amplitude: 1 V
Lead Channel Pacing Threshold Amplitude: 1.25 V
Lead Channel Pacing Threshold Pulse Width: 0.5 ms
Lead Channel Pacing Threshold Pulse Width: 1.2 ms
Lead Channel Sensing Intrinsic Amplitude: 0.2 mV
Lead Channel Setting Pacing Amplitude: 2 V
Lead Channel Setting Pacing Amplitude: 2.5 V
Lead Channel Setting Pacing Amplitude: 2.5 V
Lead Channel Setting Pacing Pulse Width: 1.2 ms
Zone Setting Detection Interval: 300 ms
Zone Setting Detection Interval: 335 ms

## 2013-08-04 NOTE — Progress Notes (Signed)
Pt seen in clinic for diaphramatic stimulation. Changed LV output from 2.5 to 2.0 V. Pt had no diaphramatic stimulation with changes. ROV 08-26-13 @ 1100 with Brooke.

## 2013-08-05 ENCOUNTER — Encounter: Payer: Medicare Other | Admitting: Cardiology

## 2013-08-18 ENCOUNTER — Inpatient Hospital Stay: Payer: Medicare Other | Admitting: Infectious Diseases

## 2013-08-20 ENCOUNTER — Encounter (HOSPITAL_COMMUNITY): Payer: Self-pay | Admitting: Emergency Medicine

## 2013-08-20 ENCOUNTER — Emergency Department (HOSPITAL_COMMUNITY): Payer: Medicare Other

## 2013-08-20 ENCOUNTER — Inpatient Hospital Stay (HOSPITAL_COMMUNITY)
Admission: EM | Admit: 2013-08-20 | Discharge: 2013-09-07 | DRG: 377 | Disposition: E | Payer: Medicare Other | Attending: Internal Medicine | Admitting: Internal Medicine

## 2013-08-20 DIAGNOSIS — Z515 Encounter for palliative care: Secondary | ICD-10-CM

## 2013-08-20 DIAGNOSIS — Z833 Family history of diabetes mellitus: Secondary | ICD-10-CM

## 2013-08-20 DIAGNOSIS — N183 Chronic kidney disease, stage 3 unspecified: Secondary | ICD-10-CM | POA: Diagnosis present

## 2013-08-20 DIAGNOSIS — E872 Acidosis, unspecified: Secondary | ICD-10-CM | POA: Diagnosis present

## 2013-08-20 DIAGNOSIS — E1142 Type 2 diabetes mellitus with diabetic polyneuropathy: Secondary | ICD-10-CM | POA: Diagnosis present

## 2013-08-20 DIAGNOSIS — R4182 Altered mental status, unspecified: Secondary | ICD-10-CM

## 2013-08-20 DIAGNOSIS — D500 Iron deficiency anemia secondary to blood loss (chronic): Secondary | ICD-10-CM | POA: Diagnosis present

## 2013-08-20 DIAGNOSIS — D62 Acute posthemorrhagic anemia: Secondary | ICD-10-CM | POA: Diagnosis not present

## 2013-08-20 DIAGNOSIS — R7989 Other specified abnormal findings of blood chemistry: Secondary | ICD-10-CM

## 2013-08-20 DIAGNOSIS — E440 Moderate protein-calorie malnutrition: Secondary | ICD-10-CM | POA: Diagnosis present

## 2013-08-20 DIAGNOSIS — K219 Gastro-esophageal reflux disease without esophagitis: Secondary | ICD-10-CM | POA: Diagnosis present

## 2013-08-20 DIAGNOSIS — K2970 Gastritis, unspecified, without bleeding: Secondary | ICD-10-CM | POA: Diagnosis present

## 2013-08-20 DIAGNOSIS — G9349 Other encephalopathy: Secondary | ICD-10-CM | POA: Diagnosis not present

## 2013-08-20 DIAGNOSIS — E039 Hypothyroidism, unspecified: Secondary | ICD-10-CM | POA: Diagnosis present

## 2013-08-20 DIAGNOSIS — D649 Anemia, unspecified: Secondary | ICD-10-CM | POA: Diagnosis present

## 2013-08-20 DIAGNOSIS — Z95810 Presence of automatic (implantable) cardiac defibrillator: Secondary | ICD-10-CM | POA: Diagnosis present

## 2013-08-20 DIAGNOSIS — IMO0002 Reserved for concepts with insufficient information to code with codable children: Secondary | ICD-10-CM

## 2013-08-20 DIAGNOSIS — J4489 Other specified chronic obstructive pulmonary disease: Secondary | ICD-10-CM | POA: Diagnosis present

## 2013-08-20 DIAGNOSIS — M109 Gout, unspecified: Secondary | ICD-10-CM | POA: Diagnosis present

## 2013-08-20 DIAGNOSIS — N179 Acute kidney failure, unspecified: Secondary | ICD-10-CM | POA: Diagnosis present

## 2013-08-20 DIAGNOSIS — I4729 Other ventricular tachycardia: Secondary | ICD-10-CM | POA: Diagnosis present

## 2013-08-20 DIAGNOSIS — I959 Hypotension, unspecified: Secondary | ICD-10-CM | POA: Diagnosis present

## 2013-08-20 DIAGNOSIS — K2990 Gastroduodenitis, unspecified, without bleeding: Secondary | ICD-10-CM

## 2013-08-20 DIAGNOSIS — R6521 Severe sepsis with septic shock: Secondary | ICD-10-CM

## 2013-08-20 DIAGNOSIS — I5022 Chronic systolic (congestive) heart failure: Secondary | ICD-10-CM | POA: Diagnosis present

## 2013-08-20 DIAGNOSIS — Z79899 Other long term (current) drug therapy: Secondary | ICD-10-CM

## 2013-08-20 DIAGNOSIS — J449 Chronic obstructive pulmonary disease, unspecified: Secondary | ICD-10-CM

## 2013-08-20 DIAGNOSIS — I255 Ischemic cardiomyopathy: Secondary | ICD-10-CM | POA: Diagnosis present

## 2013-08-20 DIAGNOSIS — I2510 Atherosclerotic heart disease of native coronary artery without angina pectoris: Secondary | ICD-10-CM | POA: Diagnosis present

## 2013-08-20 DIAGNOSIS — J189 Pneumonia, unspecified organism: Secondary | ICD-10-CM | POA: Diagnosis present

## 2013-08-20 DIAGNOSIS — R6520 Severe sepsis without septic shock: Secondary | ICD-10-CM

## 2013-08-20 DIAGNOSIS — E119 Type 2 diabetes mellitus without complications: Secondary | ICD-10-CM

## 2013-08-20 DIAGNOSIS — E1149 Type 2 diabetes mellitus with other diabetic neurological complication: Secondary | ICD-10-CM | POA: Diagnosis present

## 2013-08-20 DIAGNOSIS — D638 Anemia in other chronic diseases classified elsewhere: Secondary | ICD-10-CM | POA: Diagnosis present

## 2013-08-20 DIAGNOSIS — I472 Ventricular tachycardia, unspecified: Secondary | ICD-10-CM | POA: Diagnosis present

## 2013-08-20 DIAGNOSIS — R531 Weakness: Secondary | ICD-10-CM

## 2013-08-20 DIAGNOSIS — I480 Paroxysmal atrial fibrillation: Secondary | ICD-10-CM | POA: Diagnosis present

## 2013-08-20 DIAGNOSIS — K922 Gastrointestinal hemorrhage, unspecified: Principal | ICD-10-CM | POA: Diagnosis present

## 2013-08-20 DIAGNOSIS — I129 Hypertensive chronic kidney disease with stage 1 through stage 4 chronic kidney disease, or unspecified chronic kidney disease: Secondary | ICD-10-CM | POA: Diagnosis present

## 2013-08-20 DIAGNOSIS — A419 Sepsis, unspecified organism: Secondary | ICD-10-CM | POA: Diagnosis present

## 2013-08-20 DIAGNOSIS — Z8249 Family history of ischemic heart disease and other diseases of the circulatory system: Secondary | ICD-10-CM

## 2013-08-20 DIAGNOSIS — I4891 Unspecified atrial fibrillation: Secondary | ICD-10-CM

## 2013-08-20 DIAGNOSIS — Z87891 Personal history of nicotine dependence: Secondary | ICD-10-CM

## 2013-08-20 DIAGNOSIS — I509 Heart failure, unspecified: Secondary | ICD-10-CM | POA: Diagnosis present

## 2013-08-20 DIAGNOSIS — I447 Left bundle-branch block, unspecified: Secondary | ICD-10-CM | POA: Diagnosis present

## 2013-08-20 DIAGNOSIS — Z9229 Personal history of other drug therapy: Secondary | ICD-10-CM

## 2013-08-20 DIAGNOSIS — I2589 Other forms of chronic ischemic heart disease: Secondary | ICD-10-CM | POA: Diagnosis present

## 2013-08-20 DIAGNOSIS — Z7901 Long term (current) use of anticoagulants: Secondary | ICD-10-CM

## 2013-08-20 DIAGNOSIS — E871 Hypo-osmolality and hyponatremia: Secondary | ICD-10-CM | POA: Diagnosis present

## 2013-08-20 DIAGNOSIS — Z66 Do not resuscitate: Secondary | ICD-10-CM | POA: Diagnosis present

## 2013-08-20 LAB — CBC WITH DIFFERENTIAL/PLATELET
BASOS ABS: 0.1 10*3/uL (ref 0.0–0.1)
BASOS PCT: 0 % (ref 0–1)
EOS PCT: 2 % (ref 0–5)
Eosinophils Absolute: 0.3 10*3/uL (ref 0.0–0.7)
HEMATOCRIT: 18.8 % — AB (ref 39.0–52.0)
Hemoglobin: 6.8 g/dL — CL (ref 13.0–17.0)
Lymphocytes Relative: 9 % — ABNORMAL LOW (ref 12–46)
Lymphs Abs: 1.3 10*3/uL (ref 0.7–4.0)
MCH: 33.3 pg (ref 26.0–34.0)
MCHC: 36.2 g/dL — ABNORMAL HIGH (ref 30.0–36.0)
MCV: 92.2 fL (ref 78.0–100.0)
MONO ABS: 1 10*3/uL (ref 0.1–1.0)
Monocytes Relative: 7 % (ref 3–12)
Neutro Abs: 11.6 10*3/uL — ABNORMAL HIGH (ref 1.7–7.7)
Neutrophils Relative %: 82 % — ABNORMAL HIGH (ref 43–77)
Platelets: 229 10*3/uL (ref 150–400)
RBC: 2.04 MIL/uL — ABNORMAL LOW (ref 4.22–5.81)
RDW: 20.7 % — AB (ref 11.5–15.5)
WBC: 14.2 10*3/uL — ABNORMAL HIGH (ref 4.0–10.5)

## 2013-08-20 MED ORDER — SODIUM CHLORIDE 0.9 % IV SOLN
1000.0000 mL | Freq: Once | INTRAVENOUS | Status: AC
  Administered 2013-08-21: 1000 mL via INTRAVENOUS

## 2013-08-20 NOTE — ED Notes (Signed)
Pt transported from [facility] for weakness and diarrhea, pt is currently being treated for CDIFF

## 2013-08-20 NOTE — ED Notes (Signed)
EKG given to EDP, otter,MD. For review.

## 2013-08-20 NOTE — ED Notes (Signed)
Bed: [id number] Expected date: 08/28/2013 Expected time: 10:43 PM Means of arrival: Ambulance Comments: cdiff/lethargic

## 2013-08-20 NOTE — ED Provider Notes (Signed)
CSN: [id number]     Arrival date & time 08/31/2013  2303 History   First MD Initiated Contact with Patient 08/12/2013 2309     Chief Complaint  Patient presents with  . Diarrhea  . Weakness   (Consider location/radiation/quality/duration/timing/severity/associated sxs/prior Treatment) The history is provided by the patient, medical records, the EMS personnel and the nursing home. No language interpreter was used.    Jared Landry Is a 73 year old male presenting from skilled nursing facility for weakness and anemia.  Brought in by EMS. Patient states he has had worsening weakness.  Over the past 2 days he has been unable to get up to go to the bathroom.  He can normally ambulate.  Patient was recently discharged from the hospital last month for enterococcus bacteremia, C. difficile colitis.   He states that he's had multiple dark tarry stools. He endorses weakness, shortness of breath.  He said does take warfarin. Has a PICC line present and has been getting ampicillin. Denies fevers, chills, myalgias, arthralgias. Denies dysuria, flank pain, suprapubic pain, frequency, urgency, or hematuria. Denies headaches, light headedness visual disturbances. Denies abdominal pain, nausea, vomiting, or constipation.    Past Medical History  Diagnosis Date  . Atrial fibrillation     Cardioversion 07/20/11  transiently <3h successful  . CAD (coronary artery disease)     s/p drug eluting stent  . Diabetes mellitus     type II  . Hypertension   . Gout   . Osteoarthritis   . COPD (chronic obstructive pulmonary disease)   . 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 Jude  . Ventricular tachycardia     terminated by the ICD  . Chronic systolic heart failure   . CHF (congestive heart failure)   . Automatic implantable cardioverter-defibrillator in situ   . Shortness of breath   . Neuromuscular disorder     DIABETIC NEUROPATHY   Past Surgical History  Procedure  Laterality Date  . Appendectomy    . Tonsillectomy    . Vasectomy    . Surgical i &d after brown recluse inner thigh    . Cardiac defibrillator placement      St. Jude   . Cardioversion  07/20/2011    Procedure: CARDIOVERSION;  Surgeon: Donato SchultzMark Skains, MD;  [facility];  Service: Cardiovascular;  Laterality: N/A;  . Cardioversion  01/18/2012    Procedure: CARDIOVERSION;  Surgeon: Donato SchultzMark Skains, MD;  [facility];  Service: Cardiovascular;  Laterality: N/A;  . Tee without cardioversion N/A 07/22/2013    Procedure: TRANSESOPHAGEAL ECHOCARDIOGRAM (TEE);  Surgeon: Vesta MixerPhilip J Nahser, MD;  [facility];  Service: Cardiovascular;  Laterality: N/A;   Family History  Problem Relation Age of Onset  . Heart failure Mother   . Diabetes type II Sister   . Appendicitis Father     rupture   History  Substance Use Topics  . Smoking status: Former Smoker -- 0.30 packs/day    Types: Cigarettes    Quit date: 10/29/2006  . Smokeless tobacco: Never Used  . Alcohol Use: 16.8 oz/week    28 Shots of liquor per week    Review of Systems Ten systems reviewed and are negative for acute change, except as noted in the HPI.    Allergies  Gabapentin and Pregabalin  Home Medications   Current Outpatient Rx  Name  Route  Sig  Dispense  Refill  . acetaminophen (TYLENOL) 325 MG tablet   Oral   Take  2 tablets (650 mg total) by mouth every 6 (six) hours as needed for mild pain or fever.   30 tablet   3   . albuterol (PROVENTIL HFA;VENTOLIN HFA) 108 (90 BASE) MCG/ACT inhaler   Inhalation   Inhale 2 puffs into the lungs every 6 (six) hours as needed. For shortness of breath.         Marland Kitchen albuterol (PROVENTIL) (5 MG/ML) 0.5% nebulizer solution   Nebulization   Take 0.5 mLs (2.5 mg total) by nebulization every 6 (six) hours as needed for wheezing or shortness of breath.   20 mL   12   . allopurinol (ZYLOPRIM) 100 MG tablet   Oral   Take 1 tablet (100 mg total) by mouth daily. Along with 300  mg - total dose of 400 mg of Allopurinol daily.   30 tablet   0   . allopurinol (ZYLOPRIM) 300 MG tablet   Oral   Take 300 mg by mouth daily.         Marland Kitchen amiodarone (PACERONE) 200 MG tablet   Oral   Take 1 tablet (200 mg total) by mouth daily.   30 tablet   12   . aspirin EC 81 MG tablet   Oral   Take 81 mg by mouth daily.         . Astaxanthin 4 MG CAPS   Oral   Take 1 capsule by mouth daily.         Marland Kitchen atorvastatin (LIPITOR) 10 MG tablet   Oral   Take 5 mg by mouth daily.          Marland Kitchen b complex vitamins tablet   Oral   Take 1 tablet by mouth every other day. Alternate with Zinc         . cholecalciferol (VITAMIN D) 1000 UNITS tablet   Oral   Take 2,000 Units by mouth every other day.          . Coenzyme Q10 (CO Q-10) 100 MG CAPS   Oral   Take 1 tablet by mouth daily. Alternate with Magnesium         . colchicine 0.6 MG tablet   Oral   Take 1 tablet (0.6 mg total) by mouth daily.   30 tablet   3   . Fluticasone-Salmeterol (ADVAIR) 100-50 MCG/DOSE AEPB   Inhalation   Inhale 1 puff into the lungs 2 (two) times daily as needed.          . furosemide (LASIX) 20 MG tablet   Oral   Take 1 tablet (20 mg total) by mouth every other day. (20 mg alternate with 40 mg daily)   30 tablet   3   . furosemide (LASIX) 40 MG tablet   Oral   Take 1 tablet (40 mg total) by mouth every other day. ( 20 mg alternate with 40 mg daily)   30 tablet   3   . levothyroxine (SYNTHROID, LEVOTHROID) 50 MCG tablet   Oral   Take 1 tablet (50 mcg total) by mouth daily before breakfast.   30 tablet   3   . Magnesium 300 MG CAPS   Oral   Take 1 capsule by mouth every other day.          . metoprolol succinate (TOPROL-XL) 100 MG 24 hr tablet   Oral   Take 100 mg by mouth daily. Take with or immediately following a meal.         .  polyethylene glycol (MIRALAX / GLYCOLAX) packet   Oral   Take 17 g by mouth daily.   14 each   0   . sodium chloride 0.9 % SOLN 50  mL with ampicillin 2 G SOLR 2 g   Intravenous   Inject 2 g into the vein every 6 (six) hours. FOR TOTAL OF 4 WEEKS   30 g   1   . warfarin (COUMADIN) 4 MG tablet      1 tablet ( 4 mg) daily; except 1/2 tablet ( 2 mg) on mondays and fridays   30 tablet   3   . Zinc 100 MG TABS   Oral   Take 1 tablet by mouth every other day. Alternate with B Complex          BP 90/66  Pulse 78  Temp(Src) 97.7 F (36.5 C) (Oral)  Resp 18  SpO2 95% Physical Exam  Nursing note and vitals reviewed. Constitutional: He appears well-developed and well-nourished. No distress.  HENT:  Head: Normocephalic and atraumatic.   Dry, sticky oral mucosa.  Pale mucosa  Eyes: Conjunctivae are normal. No scleral icterus.  Pale conjunctiva  Neck: Normal range of motion. Neck supple.  Cardiovascular: Normal rate, regular rhythm and normal heart sounds.   Pulmonary/Chest: Effort normal and breath sounds normal. No respiratory distress.  Abdominal: Soft. There is no tenderness.  Musculoskeletal: He exhibits no edema.  Neurological: He is alert.  Skin: Skin is warm and dry. He is not diaphoretic. There is pallor.  Psychiatric: His behavior is normal.    ED Course  Procedures (including critical care time) Labs Review Labs Reviewed - No data to display Imaging Review No results found.  EKG Interpretation   None      CRITICAL CARE Performed by: Arthor Captain   Total critical care time: 30  Critical care time was exclusive of separately billable procedures and treating other patients.  Critical care was necessary to treat or prevent imminent or life-threatening deterioration.  Critical care was time spent personally by me on the following activities: development of treatment plan with patient and/or surrogate as well as nursing, discussions with consultants, evaluation of patient's response to treatment, examination of patient, obtaining history from patient or surrogate, ordering and performing  treatments and interventions, ordering and review of laboratory studies, ordering and review of radiographic studies, pulse oximetry and re-evaluation of patient's condition.  MDM   1. Anemia   2. Creatinine elevation   3. Weakness   4. Hyponatremia   5. ARF (acute renal failure)   6. Atrial fibrillation   7. Chronic systolic heart failure   8. GIB (gastrointestinal bleeding)    Filed Vitals:   September 10, 2013 2303  BP: 90/66  Pulse: 78  Temp: 97.7 F (36.5 C)  TempSrc: Oral  Resp: 18  SpO2: 95%   Patient here with symptomatic anemia.  Suspected GI bleed.  Patient is currently taking warfarin. Pressures are soft. Fluids initiatied. hgb of 6.8. i have ordered 2 units of blood for transfusion.  12:25 AM Patient with elevated creatinine. He has had severe diarrrhea which I believe accounts for his creatinine elevation. Hyponatremia. Patient receiving saline. Blood transfuision . Will require admission.  Patient also has INR of 4.9 /. i have ordered 2 units of FFP for transfusion. Dr. Thedore Mins will admit to stepdown.,  Arthor Captain, PA-C 08/15/2013 1026

## 2013-08-20 NOTE — ED Notes (Signed)
Pt states he has been weaker than usual last few days, unable to walk. Pt states he is currently being tx for CDIFF, PICC line present in RUE. Pt appears pale, diffuse weakness.

## 2013-08-21 ENCOUNTER — Encounter (HOSPITAL_COMMUNITY): Admission: EM | Disposition: E | Payer: Medicare Other | Source: Home / Self Care | Attending: Internal Medicine

## 2013-08-21 ENCOUNTER — Encounter (HOSPITAL_COMMUNITY): Payer: Self-pay

## 2013-08-21 ENCOUNTER — Inpatient Hospital Stay (HOSPITAL_COMMUNITY): Payer: Medicare Other

## 2013-08-21 DIAGNOSIS — I4891 Unspecified atrial fibrillation: Secondary | ICD-10-CM

## 2013-08-21 DIAGNOSIS — E440 Moderate protein-calorie malnutrition: Secondary | ICD-10-CM | POA: Diagnosis present

## 2013-08-21 DIAGNOSIS — D649 Anemia, unspecified: Secondary | ICD-10-CM | POA: Diagnosis present

## 2013-08-21 DIAGNOSIS — Z9229 Personal history of other drug therapy: Secondary | ICD-10-CM

## 2013-08-21 DIAGNOSIS — K922 Gastrointestinal hemorrhage, unspecified: Principal | ICD-10-CM

## 2013-08-21 DIAGNOSIS — I959 Hypotension, unspecified: Secondary | ICD-10-CM | POA: Diagnosis present

## 2013-08-21 DIAGNOSIS — I480 Paroxysmal atrial fibrillation: Secondary | ICD-10-CM | POA: Diagnosis present

## 2013-08-21 DIAGNOSIS — N179 Acute kidney failure, unspecified: Secondary | ICD-10-CM | POA: Diagnosis present

## 2013-08-21 DIAGNOSIS — I5022 Chronic systolic (congestive) heart failure: Secondary | ICD-10-CM

## 2013-08-21 HISTORY — PX: ESOPHAGOGASTRODUODENOSCOPY: SHX5428

## 2013-08-21 LAB — URINALYSIS W MICROSCOPIC + REFLEX CULTURE
Glucose, UA: NEGATIVE mg/dL
Hgb urine dipstick: NEGATIVE
Ketones, ur: NEGATIVE mg/dL
Leukocytes, UA: NEGATIVE
Nitrite: NEGATIVE
Protein, ur: 30 mg/dL — AB
Specific Gravity, Urine: 1.023 (ref 1.005–1.030)
Urobilinogen, UA: 1 mg/dL (ref 0.0–1.0)
pH: 5 (ref 5.0–8.0)

## 2013-08-21 LAB — ABO/RH: ABO/RH(D): A POS

## 2013-08-21 LAB — CBC
HEMATOCRIT: 23.1 % — AB (ref 39.0–52.0)
Hemoglobin: 8.4 g/dL — ABNORMAL LOW (ref 13.0–17.0)
MCH: 32.1 pg (ref 26.0–34.0)
MCHC: 36.4 g/dL — AB (ref 30.0–36.0)
MCV: 88.2 fL (ref 78.0–100.0)
Platelets: 202 10*3/uL (ref 150–400)
RBC: 2.62 MIL/uL — ABNORMAL LOW (ref 4.22–5.81)
RDW: 19.1 % — AB (ref 11.5–15.5)
WBC: 13.7 10*3/uL — ABNORMAL HIGH (ref 4.0–10.5)

## 2013-08-21 LAB — COMPREHENSIVE METABOLIC PANEL
ALBUMIN: 2.3 g/dL — AB (ref 3.5–5.2)
ALT: 43 U/L (ref 0–53)
AST: 63 U/L — ABNORMAL HIGH (ref 0–37)
Alkaline Phosphatase: 227 U/L — ABNORMAL HIGH (ref 39–117)
BUN: 55 mg/dL — ABNORMAL HIGH (ref 6–23)
CALCIUM: 8.1 mg/dL — AB (ref 8.4–10.5)
CO2: 21 mEq/L (ref 19–32)
CREATININE: 3.25 mg/dL — AB (ref 0.50–1.35)
Chloride: 90 mEq/L — ABNORMAL LOW (ref 96–112)
GFR calc Af Amer: 20 mL/min — ABNORMAL LOW (ref >90)
GFR calc non Af Amer: 17 mL/min — ABNORMAL LOW (ref >90)
Glucose, Bld: 97 mg/dL (ref 70–99)
Potassium: 4.5 mEq/L (ref 3.7–5.3)
Sodium: 127 mEq/L — ABNORMAL LOW (ref 137–147)
TOTAL PROTEIN: 5.4 g/dL — AB (ref 6.0–8.3)
Total Bilirubin: 1.9 mg/dL — ABNORMAL HIGH (ref 0.3–1.2)

## 2013-08-21 LAB — PROTIME-INR
INR: 4.9 — ABNORMAL HIGH (ref 0.00–1.49)
INR: 2.78 — AB (ref 0.00–1.49)
Prothrombin Time: 43.7 seconds — ABNORMAL HIGH (ref 11.6–15.2)
Prothrombin Time: 28.4 seconds — ABNORMAL HIGH (ref 11.6–15.2)

## 2013-08-21 LAB — BASIC METABOLIC PANEL
BUN: 58 mg/dL — ABNORMAL HIGH (ref 6–23)
CHLORIDE: 96 meq/L (ref 96–112)
CO2: 19 meq/L (ref 19–32)
Calcium: 7.5 mg/dL — ABNORMAL LOW (ref 8.4–10.5)
Creatinine, Ser: 3.35 mg/dL — ABNORMAL HIGH (ref 0.50–1.35)
GFR calc non Af Amer: 17 mL/min — ABNORMAL LOW (ref >90)
GFR, EST AFRICAN AMERICAN: 19 mL/min — AB (ref >90)
Glucose, Bld: 105 mg/dL — ABNORMAL HIGH (ref 70–99)
POTASSIUM: 4.6 meq/L (ref 3.7–5.3)
Sodium: 132 mEq/L — ABNORMAL LOW (ref 137–147)

## 2013-08-21 LAB — PREPARE RBC (CROSSMATCH)

## 2013-08-21 LAB — CREATININE, URINE, RANDOM: Creatinine, Urine: 116.82 mg/dL

## 2013-08-21 LAB — CLOSTRIDIUM DIFFICILE BY PCR: Toxigenic C. Difficile by PCR: NEGATIVE

## 2013-08-21 LAB — LACTIC ACID, PLASMA: Lactic Acid, Venous: 1.4 mmol/L (ref 0.5–2.2)

## 2013-08-21 LAB — MRSA PCR SCREENING: MRSA by PCR: NEGATIVE

## 2013-08-21 LAB — HEMOGLOBIN AND HEMATOCRIT, BLOOD
HCT: 23.9 % — ABNORMAL LOW (ref 39.0–52.0)
Hemoglobin: 8.6 g/dL — ABNORMAL LOW (ref 13.0–17.0)

## 2013-08-21 LAB — OCCULT BLOOD X 1 CARD TO LAB, STOOL: Fecal Occult Bld: NEGATIVE

## 2013-08-21 LAB — OSMOLALITY: Osmolality: 293 mOsm/kg (ref 275–300)

## 2013-08-21 LAB — MAGNESIUM: Magnesium: 2 mg/dL (ref 1.5–2.5)

## 2013-08-21 LAB — SODIUM, URINE, RANDOM: Sodium, Ur: <20 mEq/L

## 2013-08-21 LAB — OSMOLALITY, URINE: Osmolality, Ur: 310 mOsm/kg — ABNORMAL LOW (ref 390–1090)

## 2013-08-21 SURGERY — EGD (ESOPHAGOGASTRODUODENOSCOPY)
Anesthesia: Moderate Sedation

## 2013-08-21 MED ORDER — FENTANYL CITRATE 0.05 MG/ML IJ SOLN
INTRAMUSCULAR | Status: AC
  Filled 2013-08-21: qty 2

## 2013-08-21 MED ORDER — ACETAMINOPHEN 650 MG RE SUPP: 650.0000 mg | Freq: Four times a day (QID) | RECTAL | Status: DC | PRN

## 2013-08-21 MED ORDER — ATORVASTATIN CALCIUM 10 MG PO TABS
5.0000 mg | ORAL_TABLET | Freq: Every day | ORAL | Status: DC
Start: 2013-08-21 — End: 2013-08-22
  Administered 2013-08-21: 5 mg via ORAL
  Filled 2013-08-21 (×2): qty 0.5

## 2013-08-21 MED ORDER — SODIUM CHLORIDE 0.9 % IV SOLN: INTRAVENOUS | Status: DC

## 2013-08-21 MED ORDER — SODIUM CHLORIDE 0.9 % IJ SOLN
3.0000 mL | Freq: Two times a day (BID) | INTRAMUSCULAR | Status: DC
  Administered 2013-08-21 – 2013-08-23 (×3): 3 mL via INTRAVENOUS

## 2013-08-21 MED ORDER — MIDAZOLAM HCL 10 MG/2ML IJ SOLN
INTRAMUSCULAR | Status: AC
  Filled 2013-08-21: qty 2

## 2013-08-21 MED ORDER — LEVOTHYROXINE SODIUM 50 MCG PO TABS
50.0000 ug | ORAL_TABLET | Freq: Every day | ORAL | Status: DC
  Administered 2013-08-21: 50 ug via ORAL
  Filled 2013-08-21 (×3): qty 1

## 2013-08-21 MED ORDER — SODIUM CHLORIDE 0.9 % IV BOLUS (SEPSIS)
500.0000 mL | Freq: Once | INTRAVENOUS | Status: AC
  Administered 2013-08-21: 500 mL via INTRAVENOUS

## 2013-08-21 MED ORDER — SODIUM CHLORIDE 0.9 % IV SOLN
8.0000 mg/h | INTRAVENOUS | Status: DC
  Administered 2013-08-21 – 2013-08-23 (×5): 8 mg/h via INTRAVENOUS
  Filled 2013-08-21 (×15): qty 80

## 2013-08-21 MED ORDER — GUAIFENESIN-DM 100-10 MG/5ML PO SYRP: 5.0000 mL | ORAL_SOLUTION | ORAL | Status: DC | PRN

## 2013-08-21 MED ORDER — VANCOMYCIN 50 MG/ML ORAL SOLUTION
250.0000 mg | Freq: Four times a day (QID) | ORAL | Status: DC
  Administered 2013-08-21 (×2): 250 mg via ORAL
  Filled 2013-08-21 (×6): qty 5

## 2013-08-21 MED ORDER — PANTOPRAZOLE SODIUM 40 MG IV SOLR
40.0000 mg | Freq: Two times a day (BID) | INTRAVENOUS | Status: DC
  Administered 2013-08-21 (×3): 40 mg via INTRAVENOUS
  Filled 2013-08-21 (×6): qty 40

## 2013-08-21 MED ORDER — FUROSEMIDE 10 MG/ML IJ SOLN
20.0000 mg | Freq: Once | INTRAMUSCULAR | Status: AC
Start: 2013-08-21 — End: 2013-08-21
  Administered 2013-08-21: 20 mg via INTRAVENOUS
  Filled 2013-08-21: qty 2

## 2013-08-21 MED ORDER — AMIODARONE HCL 200 MG PO TABS
200.0000 mg | ORAL_TABLET | Freq: Every day | ORAL | Status: DC
  Administered 2013-08-21: 200 mg via ORAL
  Filled 2013-08-21 (×4): qty 1

## 2013-08-21 MED ORDER — SODIUM CHLORIDE 0.9 % IV SOLN
2.0000 g | Freq: Four times a day (QID) | INTRAVENOUS | Status: DC
  Administered 2013-08-21: 2 g via INTRAVENOUS
  Filled 2013-08-21 (×3): qty 2000

## 2013-08-21 MED ORDER — SODIUM CHLORIDE 0.9 % IV SOLN
INTRAVENOUS | Status: AC
  Administered 2013-08-21 (×2): via INTRAVENOUS

## 2013-08-21 MED ORDER — ACETAMINOPHEN 325 MG PO TABS
650.0000 mg | ORAL_TABLET | Freq: Four times a day (QID) | ORAL | Status: DC | PRN
  Administered 2013-08-21: 650 mg via ORAL
  Filled 2013-08-21: qty 2

## 2013-08-21 MED ORDER — SODIUM CHLORIDE 0.9 % IV BOLUS (SEPSIS)
1000.0000 mL | INTRAVENOUS | Status: DC | PRN
  Administered 2013-08-21 – 2013-08-22 (×2): 1000 mL via INTRAVENOUS

## 2013-08-21 MED ORDER — ALBUTEROL SULFATE (2.5 MG/3ML) 0.083% IN NEBU: 2.5000 mg | INHALATION_SOLUTION | Freq: Four times a day (QID) | RESPIRATORY_TRACT | Status: DC | PRN

## 2013-08-21 MED ORDER — DIPHENHYDRAMINE HCL 50 MG/ML IJ SOLN
INTRAMUSCULAR | Status: AC
  Filled 2013-08-21: qty 1

## 2013-08-21 MED ORDER — MIDAZOLAM HCL 10 MG/2ML IJ SOLN
INTRAMUSCULAR | Status: DC | PRN
  Administered 2013-08-21: 1 mg via INTRAVENOUS

## 2013-08-21 MED ORDER — ONDANSETRON HCL 4 MG/2ML IJ SOLN: 4.0000 mg | Freq: Four times a day (QID) | INTRAMUSCULAR | Status: DC | PRN

## 2013-08-21 MED ORDER — ONDANSETRON HCL 4 MG PO TABS: 4.0000 mg | ORAL_TABLET | Freq: Four times a day (QID) | ORAL | Status: DC | PRN

## 2013-08-21 MED ORDER — HYDROCODONE-ACETAMINOPHEN 5-325 MG PO TABS
1.0000 | ORAL_TABLET | ORAL | Status: DC | PRN
  Administered 2013-08-21: 2 via ORAL
  Filled 2013-08-21: qty 2

## 2013-08-21 MED ORDER — SODIUM CHLORIDE 0.9 % IV SOLN
80.0000 mg | Freq: Once | INTRAVENOUS | Status: AC
  Administered 2013-08-21: 80 mg via INTRAVENOUS
  Filled 2013-08-21: qty 80

## 2013-08-21 MED ORDER — VITAMIN K1 10 MG/ML IJ SOLN
2.0000 mg | Freq: Once | INTRAMUSCULAR | Status: AC
  Administered 2013-08-21: 2 mg via INTRAVENOUS
  Filled 2013-08-21: qty 0.2

## 2013-08-21 NOTE — Progress Notes (Addendum)
Paged MD concerning BP and decreasing UOP. Shamleffer MD [phone]) returned paged and ordered foley catheter and PCXR to assess respiratory status.

## 2013-08-21 NOTE — ED Notes (Signed)
Report called to MobeetieMckinsey, RN in ICU. Lab called, blood products not ready, ETA 30minutes.

## 2013-08-21 NOTE — Consult Note (Signed)
Referring Provider: Dr. Donette LarryHusain Primary Care Physician:  Georgann HousekeeperHUSAIN,KARRAR, MD Primary Gastroenterologist:  Gentry FitzUnassigned  Reason for Consultation:  GI bleed  HPI: Jared Landry is a 73 y.o. male with multiple medical problems as stated below and on chronic Coumadin for Afib who was recently in the hospital for enterococcal bacteremia brought in due to black loose stools for the past 5-6 days with weakness. No N/V/abdominal pain/chest pain/fevers/chills. He currently complains of lower abdominal cramping. INR 4.9 yesterday and Hgb 6.8 on admit. S/P 1 U FFP and INR now 2.78. S/P 2 U PRBCs with Hgb 8.4 now. C. Diff negative. Hypotension on presentation.   Past Medical History  Diagnosis Date  . Atrial fibrillation     Cardioversion 07/20/11  transiently <3h successful  . CAD (coronary artery disease)     s/p drug eluting stent  . Diabetes mellitus     type II  . Hypertension   . Gout   . Osteoarthritis   . COPD (chronic obstructive pulmonary disease)   . 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 Jude  . Ventricular tachycardia     terminated by the ICD  . Chronic systolic heart failure   . CHF (congestive heart failure)   . Automatic implantable cardioverter-defibrillator in situ   . Shortness of breath   . Neuromuscular disorder     DIABETIC NEUROPATHY    Past Surgical History  Procedure Laterality Date  . Appendectomy    . Tonsillectomy    . Vasectomy    . Surgical i &d after brown recluse inner thigh    . Cardiac defibrillator placement      St. Jude   . Cardioversion  07/20/2011    Procedure: CARDIOVERSION;  Surgeon: Donato SchultzMark Skains, MD;  [facility];  Service: Cardiovascular;  Laterality: N/A;  . Cardioversion  01/18/2012    Procedure: CARDIOVERSION;  Surgeon: Donato SchultzMark Skains, MD;  [facility];  Service: Cardiovascular;  Laterality: N/A;  . Tee without cardioversion N/A 07/22/2013    Procedure: TRANSESOPHAGEAL ECHOCARDIOGRAM (TEE);  Surgeon:  Vesta MixerPhilip J Nahser, MD;  [facility];  Service: Cardiovascular;  Laterality: N/A;    Prior to Admission medications   Medication Sig Start Date End Date Taking? Authorizing Provider  acetaminophen (TYLENOL) 325 MG tablet Take 2 tablets (650 mg total) by mouth every 6 (six) hours as needed for mild pain or fever. 07/23/13  Yes Georgann HousekeeperKarrar Husain, MD  albuterol (PROVENTIL HFA;VENTOLIN HFA) 108 (90 BASE) MCG/ACT inhaler Inhale 2 puffs into the lungs every 6 (six) hours as needed. For shortness of breath.   Yes Historical Provider, MD  albuterol (PROVENTIL) (5 MG/ML) 0.5% nebulizer solution Take 0.5 mLs (2.5 mg total) by nebulization every 6 (six) hours as needed for wheezing or shortness of breath. 07/23/13  Yes Georgann HousekeeperKarrar Husain, MD  allopurinol (ZYLOPRIM) 100 MG tablet Take 1 tablet (100 mg total) by mouth daily. Along with 300 mg - total dose of 400 mg of Allopurinol daily. 07/23/13  Yes Georgann HousekeeperKarrar Husain, MD  allopurinol (ZYLOPRIM) 300 MG tablet Take 300 mg by mouth daily.   Yes Historical Provider, MD  amiodarone (PACERONE) 200 MG tablet Take 1 tablet (200 mg total) by mouth daily. 07/07/13 07/07/14 Yes Georgann HousekeeperKarrar Husain, MD  aspirin EC 81 MG tablet Take 81 mg by mouth daily.   Yes Historical Provider, MD  Astaxanthin 4 MG CAPS Take 1 capsule by mouth daily.   Yes Historical Provider, MD  atorvastatin (LIPITOR) 10 MG tablet Take  5 mg by mouth daily.    Yes Historical Provider, MD  b complex vitamins tablet Take 1 tablet by mouth every other day. Alternate with Zinc   Yes Historical Provider, MD  cholecalciferol (VITAMIN D) 1000 UNITS tablet Take 2,000 Units by mouth every other day.    Yes Historical Provider, MD  Coenzyme Q10 (CO Q-10) 100 MG CAPS Take 1 tablet by mouth daily. Alternate with Magnesium   Yes Historical Provider, MD  colchicine 0.6 MG tablet Take 1 tablet (0.6 mg total) by mouth daily. 07/08/13  Yes Georgann Housekeeper, MD  Fluticasone-Salmeterol (ADVAIR) 100-50 MCG/DOSE AEPB Inhale 1 puff into the  lungs 2 (two) times daily as needed.    Yes Historical Provider, MD  furosemide (LASIX) 40 MG tablet Take 40 mg by mouth 2 (two) times daily.   Yes Historical Provider, MD  levothyroxine (SYNTHROID, LEVOTHROID) 50 MCG tablet Take 1 tablet (50 mcg total) by mouth daily before breakfast. 07/07/13  Yes Georgann Housekeeper, MD  Magnesium 300 MG CAPS Take 1 capsule by mouth every other day.    Yes Historical Provider, MD  metoprolol succinate (TOPROL-XL) 100 MG 24 hr tablet Take 50 mg by mouth daily. Take with or immediately following a meal.   Yes Historical Provider, MD  polyethylene glycol (MIRALAX / GLYCOLAX) packet Take 17 g by mouth daily. 07/23/13  Yes Georgann Housekeeper, MD  potassium chloride (K-DUR) 10 MEQ tablet Take 10 mEq by mouth daily.   Yes Historical Provider, MD  sodium chloride 0.9 % SOLN 50 mL with ampicillin 2 G SOLR 2 g Inject 2 g into the vein every 6 (six) hours. FOR TOTAL OF 4 WEEKS 07/23/13  Yes Georgann Housekeeper, MD  Zinc 100 MG TABS Take 1 tablet by mouth every other day. Alternate with B Complex   Yes Historical Provider, MD  warfarin (COUMADIN) 4 MG tablet 1 tablet ( 4 mg) daily; except 1/2 tablet ( 2 mg) on mondays and fridays 07/07/13   Georgann Housekeeper, MD    Scheduled Meds: . amiodarone  200 mg Oral Daily  . atorvastatin  5 mg Oral Daily  . levothyroxine  50 mcg Oral QAC breakfast  . pantoprazole (PROTONIX) IV  40 mg Intravenous Q12H  . sodium chloride  3 mL Intravenous Q12H  . vancomycin  250 mg Oral Q6H   Continuous Infusions: . sodium chloride 100 mL/hr at 09-12-13 0837  . sodium chloride    . pantoprozole (PROTONIX) infusion 8 mg/hr (Sep 12, 2013 1200)   PRN Meds:.acetaminophen, acetaminophen, albuterol, guaiFENesin-dextromethorphan, HYDROcodone-acetaminophen, ondansetron (ZOFRAN) IV, ondansetron, sodium chloride  Allergies as of 08/29/2013 - Review Complete 08/22/2013  Allergen Reaction Noted  . Gabapentin Other (See Comments) 07/05/2013  . Pregabalin  07/24/2013    Family  History  Problem Relation Age of Onset  . Heart failure Mother   . Diabetes type II Sister   . Appendicitis Father     rupture    History   Social History  . Marital Status: Married    Spouse Name: N/A    Number of Children: N/A  . Years of Education: N/A   Occupational History  . Not on file.   Social History Main Topics  . Smoking status: Former Smoker -- 0.30 packs/day    Types: Cigarettes    Quit date: 10/29/2006  . Smokeless tobacco: Never Used  . Alcohol Use: 16.8 oz/week    28 Shots of liquor per week  . Drug Use: No  . Sexual Activity: No   Other  Topics Concern  . Not on file   Social History Narrative  . No narrative on file    Review of Systems: Unable to obtain from pt  Physical Exam: Vital signs: Filed Vitals:   09/01/2013 1230  BP: 86/49  Pulse: 80  Temp: 97.5  Resp: 20   Last BM Date: 09/09/13 General:   Lethargic, frail, elderly, thin HEENT: anicteric Lungs:  Coarse breath sounds  Heart:  Regular rate and rhythm; no murmurs, clicks, rubs,  or gallops. Abdomen: LLQ and RLQ tenderness with guarding, soft, nondistended, +BS  Rectal:  Deferred Ext: +LE edema  GI:  Lab Results:  Recent Labs  2013-09-09 2315 08/12/2013 1145  WBC 14.2* 13.7*  HGB 6.8* 8.4*  HCT 18.8* 23.1*  PLT 229 202   BMET  Recent Labs  09-09-2013 2315 08/29/2013 1145  NA 127* 132*  K 4.5 4.6  CL 90* 96  CO2 21 19  GLUCOSE 97 105*  BUN 55* 58*  CREATININE 3.25* 3.35*  CALCIUM 8.1* 7.5*   LFT  Recent Labs  2013/09/09 2315  PROT 5.4*  ALBUMIN 2.3*  AST 63*  ALT 43  ALKPHOS 227*  BILITOT 1.9*   PT/INR  Recent Labs  09/09/2013 2315 08/27/2013 1145  LABPROT 43.7* 28.4*  INR 4.90* 2.78*     Studies/Results: Dg Chest Port 1 View  08/13/2013   CLINICAL DATA:  Generalized weakness. Shortness of breath. Diarrhea.  EXAM: PORTABLE CHEST - 1 VIEW  COMPARISON:  Portable examination 07/18/2013, 11/06/2009 and two-view chest x-ray 07/16/2013.  FINDINGS: Cardiac  silhouette moderately enlarged but stable. Biventricular pacing defibrillator unchanged. Right arm PICC tip projects over the lower SVC at or near the cavoatrial junction. Consolidation in the left lower lobe with silhouetting of the left hemidiaphragm. Lungs otherwise clear. Pulmonary vascularity normal. No pneumothorax.  IMPRESSION: 1. Left lower lobe atelectasis and/or pneumonia. 2. Stable moderate cardiomegaly without evidence of pulmonary edema.   Electronically Signed   By: Hulan Saas M.D.   On: 08/17/2013 00:25    Impression/Plan: 73 yo with melena concerning for an upper GI bleed in the setting of a supratherapeutic INR. Correcting coagulopathy. Bedside EGD. Protonix drip. Volume resuscitate. Supportive care.    LOS: 1 day   Belem Hintze C.  08/12/2013, 12:43 PM

## 2013-08-21 NOTE — Progress Notes (Signed)
CARE MANAGEMENT NOTE 08/25/2013  Patient:  Vianne BullsBRAY,Kimon E   Account Number:  [id number]  Date Initiated:  08/15/2013  Documentation initiated by:  DAVIS,RHONDA  Subjective/Objective Assessment:   pt with hx of recent c.diff readmitted due to weakness and dirrhea, c.diff on this admit neg, hx of bivaluar pace maker, hypotensive and anemic-hgb 6.8 on admit, no bld noted.     Action/Plan:   from NH/[facility]   Anticipated DC Date:  08/24/2013   Anticipated DC Plan:  SKILLED NURSING FACILITY  In-house referral  Clinical Social Worker      DC Planning Services  NA      [facility]  NA   Choice offered to / List presented to:  NA   DME arranged  NA      DME agency  NA     HH arranged  NA      HH agency  NA   Status of service:  In process, will continue to follow Medicare Important Message given?  NA - LOS <3 / Initial given by admissions (If response is "NO", the following Medicare IM given date fields will be blank) Date Medicare IM given:   Date Additional Medicare IM given:    Discharge Disposition:    Per UR Regulation:  Reviewed for med. necessity/level of care/duration of stay  If discussed at Long Length of Stay Meetings, dates discussed:    Comments:  08/12/2013/Rhonda Stark JockDavis, RN, BSN, [place] [phone] Chart Reviewed for discharge and hospital needs. Discharge needs at time of review:  None present will follow for needs. Review of patient progress due on [id number].

## 2013-08-21 NOTE — Progress Notes (Signed)
INITIAL NUTRITION ASSESSMENT  Pt meets criteria for moderate MALNUTRITION in the context of chronic illness as evidenced by <75% estimated energy intake in the past month in addition to pt with mild/moderate muscle wasting in clavicles and upper arms.  DOCUMENTATION CODES Per approved criteria  -Non-severe (moderate) malnutrition in the context of chronic illness   INTERVENTION: - Diet advancement per MD - Recommend Ensure Complete BID when diet advanced - Will continue to monitor   NUTRITION DIAGNOSIS: Inadequate oral intake related to inability to eat as evidenced by NPO.   Goal: Advance diet as tolerated to low sodium diet   Monitor:  Weights, labs, diet advancement  Reason for Assessment: Low braden  73 y.o. male  Admitting Dx: GIB (gastrointestinal bleeding)  ASSESSMENT: Pt with history of atrial fibrillation on Coumadin, chronic systolic heart failure with biventricular pacemaker, hypertension, COPD, recently admitted for enterococcal bacteremia and discharged with right arm PICC line on 4 weeks of IV ampicillin 4 times a day which is supposed to finish tomorrow per wife, currently living in a nursing home has been brought in from nursing home for 5-6 day history of diarrhea, patient also reports that he has noticed black-colored stool mixed with some blood from time to time for the last 5-6 days, he's been getting progressively weak, appears pale.   Pt falling asleep during visit. Was able to state that he was eating 3 meals/day PTA however weight trend shows pt's weight down 43 pounds in the past month. States he was not on any nutritional supplements but he "should have been".   Nutrition Focused Physical Exam:  Subcutaneous Fat:  Orbital Region: WNL Upper Arm Region: mild/moderate wasting Thoracic and Lumbar Region: NA  Muscle:  Temple Region: mild/moderate wasting Clavicle Bone Region: mild/moderate wasting Clavicle and Acromion Bone Region: mild/moderate  wasting Scapular Bone Region: NA Dorsal Hand: WNL Patellar Region: NA Anterior Thigh Region: NA Posterior Calf Region: NA  Edema: None noted   Sodium slightly low BUN/Cr elevated with low GFR AST and total bilirubin elevated  Height: Ht Readings from Last 1 Encounters:  08/28/2013 5\' 8"  (1.727 m)    Weight: Wt Readings from Last 1 Encounters:  08/31/2013 164 lb 3.9 oz (74.5 kg)    Ideal Body Weight: 154 lb   % Ideal Body Weight: 106%  Wt Readings from Last 10 Encounters:  08/15/2013 164 lb 3.9 oz (74.5 kg)  08/12/2013 164 lb 3.9 oz (74.5 kg)  07/22/13 207 lb 0.2 oz (93.9 kg)  07/22/13 207 lb 0.2 oz (93.9 kg)  07/15/13 196 lb (88.905 kg)  07/05/13 193 lb 9 oz (87.8 kg)  04/11/13 189 lb (85.73 kg)  01/03/13 185 lb (83.915 kg)  01/18/12 280 lb (127.007 kg)  01/18/12 280 lb (127.007 kg)    Usual Body Weight: 207 lb last month   % Usual Body Weight: 79%  BMI:  Body mass index is 24.98 kg/(m^2).  Estimated Nutritional Needs: Kcal: 1850-2050 Protein: 90-110g Fluid: 1.8-2L/day  Skin: Intact   Diet Order: NPO  EDUCATION NEEDS: -No education needs identified at this time   Intake/Output Summary (Last 24 hours) at 08/14/2013 1211 Last data filed at 08/19/2013 1120  Gross per 24 hour  Intake 2307.25 ml  Output    125 ml  Net 2182.25 ml    Last BM: 1/14  Labs:   Recent Labs Lab 07-Jan-2014 2315  NA 127*  K 4.5  CL 90*  CO2 21  BUN 55*  CREATININE 3.25*  CALCIUM 8.1*  GLUCOSE 97    CBG (last 3)  No results found for this basename: GLUCAP,  in the last 72 hours  Scheduled Meds: . amiodarone  200 mg Oral Daily  . atorvastatin  5 mg Oral Daily  . levothyroxine  50 mcg Oral QAC breakfast  . pantoprazole (PROTONIX) IV  40 mg Intravenous Q12H  . sodium chloride  3 mL Intravenous Q12H  . vancomycin  250 mg Oral Q6H    Continuous Infusions: . sodium chloride 100 mL/hr at 08/22/2013 0837  . pantoprozole (PROTONIX) infusion 8 mg/hr (09/05/2013 1100)    Past  Medical History  Diagnosis Date  . Atrial fibrillation     Cardioversion 07/20/11  transiently <3h successful  . CAD (coronary artery disease)     s/p drug eluting stent  . Diabetes mellitus     type II  . Hypertension   . Gout   . Osteoarthritis   . COPD (chronic obstructive pulmonary disease)   . 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 Jude  . Ventricular tachycardia     terminated by the ICD  . Chronic systolic heart failure   . CHF (congestive heart failure)   . Automatic implantable cardioverter-defibrillator in situ   . Shortness of breath   . Neuromuscular disorder     DIABETIC NEUROPATHY    Past Surgical History  Procedure Laterality Date  . Appendectomy    . Tonsillectomy    . Vasectomy    . Surgical i &d after brown recluse inner thigh    . Cardiac defibrillator placement      St. Jude   . Cardioversion  07/20/2011    Procedure: CARDIOVERSION;  Surgeon: Donato Schultz, MD;  Location: [facility] OR;  Service: Cardiovascular;  Laterality: N/A;  . Cardioversion  01/18/2012    Procedure: CARDIOVERSION;  Surgeon: Donato Schultz, MD;  Location: [facility] OR;  Service: Cardiovascular;  Laterality: N/A;  . Tee without cardioversion N/A 07/22/2013    Procedure: TRANSESOPHAGEAL ECHOCARDIOGRAM (TEE);  Surgeon: Vesta Mixer, MD;  Location: [facility] ENDOSCOPY;  Service: Cardiovascular;  Laterality: N/A;    Levon Hedger MS, RD, LDN [phone] Pager [phone] After Hours Pager

## 2013-08-21 NOTE — Interval H&P Note (Signed)
History and Physical Interval Note:  05/09/14 1:04 PM  Jared Landry  has presented today for surgery, with the diagnosis of              G.I Bleed  The various methods of treatment have been discussed with the patient and family. After consideration of risks, benefits and other options for treatment, the patient has consented to  Procedure(s): ESOPHAGOGASTRODUODENOSCOPY (EGD) (N/A) as a surgical intervention .  The patient's history has been reviewed, patient examined, no change in status, stable for surgery.  I have reviewed the patient's chart and labs.  Questions were answered to the patient's satisfaction.     Lennox Leikam C.

## 2013-08-21 NOTE — Progress Notes (Signed)
Subjective: Pt awake feel weak Some abd pain  Objective: Vital signs in last 24 hours: Temp:  [97.4 F (36.3 C)-98.3 F (36.8 C)] 98 F (36.7 C) (01/15 0700) Pulse Rate:  [74-124] 124 (01/15 0645) Resp:  [14-28] 16 (01/15 0645) BP: (85-106)/(39-66) 95/59 mmHg (01/15 0700) SpO2:  [93 %-100 %] 100 % (01/15 0645) Weight:  [74.5 kg (164 lb 3.9 oz)] 74.5 kg (164 lb 3.9 oz) (01/15 0130) Weight change:  Last BM Date: 08/29/2013  Intake/Output from previous day: 01/14 0701 - 01/15 0700 In: 1047.7 [I.V.:404.2; Blood:543.5; IV Piggyback:100] Out: 50 [Urine:50] Intake/Output this shift:    General appearance: alert Resp: clear to auscultation bilaterally Cardio: regular rate and rhythm GI: soft, non-tender; bowel sounds normal; no masses,  no organomegaly and mild tender  Lab Results:  Recent Labs  08/28/2013 2315  WBC 14.2*  HGB 6.8*  HCT 18.8*  PLT 229   BMET  Recent Labs  08/19/2013 2315  NA 127*  K 4.5  CL 90*  CO2 21  GLUCOSE 97  BUN 55*  CREATININE 3.25*  CALCIUM 8.1*    Studies/Results: Dg Chest Port 1 View  2014/07/22   CLINICAL DATA:  Generalized weakness. Shortness of breath. Diarrhea.  EXAM: PORTABLE CHEST - 1 VIEW  COMPARISON:  Portable examination 07/18/2013, 11/06/2009 and two-view chest x-ray 07/16/2013.  FINDINGS: Cardiac silhouette moderately enlarged but stable. Biventricular pacing defibrillator unchanged. Right arm PICC tip projects over the lower SVC at or near the cavoatrial junction. Consolidation in the left lower lobe with silhouetting of the left hemidiaphragm. Lungs otherwise clear. Pulmonary vascularity normal. No pneumothorax.  IMPRESSION: 1. Left lower lobe atelectasis and/or pneumonia. 2. Stable moderate cardiomegaly without evidence of pulmonary edema.   Electronically Signed   By: Hulan Saashomas  Lawrence M.D.   On: 02015/12/16 00:25    Medications: I have reviewed the patient's current medications.  Assessment/Plan: Severe Anemia- proabable UGI  source-  Stool negative gaiuic- elevated Bun/ Cr- with elevated INR Continue PPI; GI consult- EGD- hold coumadin- FFP- repeat INR Diarrhrea- C. Diff - on vancomycin-  [facility] C diff Dehydration: IVF- hold Lasix A/C Renal failure- combimnation of GI bleed and dehydration: fluids Cr baseline 1.9-2.0 Bacteriemia with urine source in 12/14- finish 4 week of Ampicillin- blood culture resent- stop Ampicillin. CHF-  Hold lasix due to  Hypotension Afib/  On amiodarone; BB DM- borderline- no med Hypothyroid- on synthroid    LOS: 1 day   Aneri Slagel 2014/07/22, 7:25 AM

## 2013-08-21 NOTE — H&P (View-Only) (Signed)
Referring Provider: Dr. Husain Primary Care Physician:  HUSAIN,KARRAR, MD Primary Gastroenterologist:  Unassigned  Reason for Consultation:  GI bleed  HPI: Jared Landry is a 72 y.o. male with multiple medical problems as stated below and on chronic Coumadin for Afib who was recently in the hospital for enterococcal bacteremia brought in due to black loose stools for the past 5-6 days with weakness. No N/V/abdominal pain/chest pain/fevers/chills. He currently complains of lower abdominal cramping. INR 4.9 yesterday and Hgb 6.8 on admit. S/P 1 U FFP and INR now 2.78. S/P 2 U PRBCs with Hgb 8.4 now. C. Diff negative. Hypotension on presentation.   Past Medical History  Diagnosis Date  . Atrial fibrillation     Cardioversion 07/20/11  transiently <3h successful  . CAD (coronary artery disease)     s/p drug eluting stent  . Diabetes mellitus     type II  . Hypertension   . Gout   . Osteoarthritis   . COPD (chronic obstructive pulmonary disease)   . 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 Jude  . Ventricular tachycardia     terminated by the ICD  . Chronic systolic heart failure   . CHF (congestive heart failure)   . Automatic implantable cardioverter-defibrillator in situ   . Shortness of breath   . Neuromuscular disorder     DIABETIC NEUROPATHY    Past Surgical History  Procedure Laterality Date  . Appendectomy    . Tonsillectomy    . Vasectomy    . Surgical i &d after brown recluse inner thigh    . Cardiac defibrillator placement      St. Jude   . Cardioversion  07/20/2011    Procedure: CARDIOVERSION;  Surgeon: Mark Skains, MD;  Location: [facility] OR;  Service: Cardiovascular;  Laterality: N/A;  . Cardioversion  01/18/2012    Procedure: CARDIOVERSION;  Surgeon: Mark Skains, MD;  Location: [facility] OR;  Service: Cardiovascular;  Laterality: N/A;  . Tee without cardioversion N/A 07/22/2013    Procedure: TRANSESOPHAGEAL ECHOCARDIOGRAM (TEE);  Surgeon:  Philip J Nahser, MD;  Location: [facility] ENDOSCOPY;  Service: Cardiovascular;  Laterality: N/A;    Prior to Admission medications   Medication Sig Start Date End Date Taking? Authorizing Provider  acetaminophen (TYLENOL) 325 MG tablet Take 2 tablets (650 mg total) by mouth every 6 (six) hours as needed for mild pain or fever. 07/23/13  Yes Karrar Husain, MD  albuterol (PROVENTIL HFA;VENTOLIN HFA) 108 (90 BASE) MCG/ACT inhaler Inhale 2 puffs into the lungs every 6 (six) hours as needed. For shortness of breath.   Yes Historical Provider, MD  albuterol (PROVENTIL) (5 MG/ML) 0.5% nebulizer solution Take 0.5 mLs (2.5 mg total) by nebulization every 6 (six) hours as needed for wheezing or shortness of breath. 07/23/13  Yes Karrar Husain, MD  allopurinol (ZYLOPRIM) 100 MG tablet Take 1 tablet (100 mg total) by mouth daily. Along with 300 mg - total dose of 400 mg of Allopurinol daily. 07/23/13  Yes Karrar Husain, MD  allopurinol (ZYLOPRIM) 300 MG tablet Take 300 mg by mouth daily.   Yes Historical Provider, MD  amiodarone (PACERONE) 200 MG tablet Take 1 tablet (200 mg total) by mouth daily. 07/07/13 07/07/14 Yes Karrar Husain, MD  aspirin EC 81 MG tablet Take 81 mg by mouth daily.   Yes Historical Provider, MD  Astaxanthin 4 MG CAPS Take 1 capsule by mouth daily.   Yes Historical Provider, MD  atorvastatin (LIPITOR) 10 MG tablet Take   5 mg by mouth daily.    Yes Historical Provider, MD  b complex vitamins tablet Take 1 tablet by mouth every other day. Alternate with Zinc   Yes Historical Provider, MD  cholecalciferol (VITAMIN D) 1000 UNITS tablet Take 2,000 Units by mouth every other day.    Yes Historical Provider, MD  Coenzyme Q10 (CO Q-10) 100 MG CAPS Take 1 tablet by mouth daily. Alternate with Magnesium   Yes Historical Provider, MD  colchicine 0.6 MG tablet Take 1 tablet (0.6 mg total) by mouth daily. 07/08/13  Yes Karrar Husain, MD  Fluticasone-Salmeterol (ADVAIR) 100-50 MCG/DOSE AEPB Inhale 1 puff into the  lungs 2 (two) times daily as needed.    Yes Historical Provider, MD  furosemide (LASIX) 40 MG tablet Take 40 mg by mouth 2 (two) times daily.   Yes Historical Provider, MD  levothyroxine (SYNTHROID, LEVOTHROID) 50 MCG tablet Take 1 tablet (50 mcg total) by mouth daily before breakfast. 07/07/13  Yes Karrar Husain, MD  Magnesium 300 MG CAPS Take 1 capsule by mouth every other day.    Yes Historical Provider, MD  metoprolol succinate (TOPROL-XL) 100 MG 24 hr tablet Take 50 mg by mouth daily. Take with or immediately following a meal.   Yes Historical Provider, MD  polyethylene glycol (MIRALAX / GLYCOLAX) packet Take 17 g by mouth daily. 07/23/13  Yes Karrar Husain, MD  potassium chloride (K-DUR) 10 MEQ tablet Take 10 mEq by mouth daily.   Yes Historical Provider, MD  sodium chloride 0.9 % SOLN 50 mL with ampicillin 2 G SOLR 2 g Inject 2 g into the vein every 6 (six) hours. FOR TOTAL OF 4 WEEKS 07/23/13  Yes Karrar Husain, MD  Zinc 100 MG TABS Take 1 tablet by mouth every other day. Alternate with B Complex   Yes Historical Provider, MD  warfarin (COUMADIN) 4 MG tablet 1 tablet ( 4 mg) daily; except 1/2 tablet ( 2 mg) on mondays and fridays 07/07/13   Karrar Husain, MD    Scheduled Meds: . amiodarone  200 mg Oral Daily  . atorvastatin  5 mg Oral Daily  . levothyroxine  50 mcg Oral QAC breakfast  . pantoprazole (PROTONIX) IV  40 mg Intravenous Q12H  . sodium chloride  3 mL Intravenous Q12H  . vancomycin  250 mg Oral Q6H   Continuous Infusions: . sodium chloride 100 mL/hr at 08/15/2013 0837  . sodium chloride    . pantoprozole (PROTONIX) infusion 8 mg/hr (08/14/2013 1200)   PRN Meds:.acetaminophen, acetaminophen, albuterol, guaiFENesin-dextromethorphan, HYDROcodone-acetaminophen, ondansetron (ZOFRAN) IV, ondansetron, sodium chloride  Allergies as of 08/30/2013 - Review Complete 09/06/2013  Allergen Reaction Noted  . Gabapentin Other (See Comments) 07/05/2013  . Pregabalin  07/24/2013    Family  History  Problem Relation Age of Onset  . Heart failure Mother   . Diabetes type II Sister   . Appendicitis Father     rupture    History   Social History  . Marital Status: Married    Spouse Name: N/A    Number of Children: N/A  . Years of Education: N/A   Occupational History  . Not on file.   Social History Main Topics  . Smoking status: Former Smoker -- 0.30 packs/day    Types: Cigarettes    Quit date: 10/29/2006  . Smokeless tobacco: Never Used  . Alcohol Use: 16.8 oz/week    28 Shots of liquor per week  . Drug Use: No  . Sexual Activity: No   Other   Topics Concern  . Not on file   Social History Narrative  . No narrative on file    Review of Systems: Unable to obtain from pt  Physical Exam: Vital signs: Filed Vitals:   08/25/2013 1230  BP: 86/49  Pulse: 80  Temp: 97.5  Resp: 20   Last BM Date: 08/22/2013 General:   Lethargic, frail, elderly, thin HEENT: anicteric Lungs:  Coarse breath sounds  Heart:  Regular rate and rhythm; no murmurs, clicks, rubs,  or gallops. Abdomen: LLQ and RLQ tenderness with guarding, soft, nondistended, +BS  Rectal:  Deferred Ext: +LE edema  GI:  Lab Results:  Recent Labs  08/12/2013 2315 08/29/2013 1145  WBC 14.2* 13.7*  HGB 6.8* 8.4*  HCT 18.8* 23.1*  PLT 229 202   BMET  Recent Labs  08/25/2013 2315 08/28/2013 1145  NA 127* 132*  K 4.5 4.6  CL 90* 96  CO2 21 19  GLUCOSE 97 105*  BUN 55* 58*  CREATININE 3.25* 3.35*  CALCIUM 8.1* 7.5*   LFT  Recent Labs  09/06/2013 2315  PROT 5.4*  ALBUMIN 2.3*  AST 63*  ALT 43  ALKPHOS 227*  BILITOT 1.9*   PT/INR  Recent Labs  08/16/2013 2315 09/06/2013 1145  LABPROT 43.7* 28.4*  INR 4.90* 2.78*     Studies/Results: Dg Chest Port 1 View  08/30/2013   CLINICAL DATA:  Generalized weakness. Shortness of breath. Diarrhea.  EXAM: PORTABLE CHEST - 1 VIEW  COMPARISON:  Portable examination 07/18/2013, 11/06/2009 and two-view chest x-ray 07/16/2013.  FINDINGS: Cardiac  silhouette moderately enlarged but stable. Biventricular pacing defibrillator unchanged. Right arm PICC tip projects over the lower SVC at or near the cavoatrial junction. Consolidation in the left lower lobe with silhouetting of the left hemidiaphragm. Lungs otherwise clear. Pulmonary vascularity normal. No pneumothorax.  IMPRESSION: 1. Left lower lobe atelectasis and/or pneumonia. 2. Stable moderate cardiomegaly without evidence of pulmonary edema.   Electronically Signed   By: Thomas  Lawrence M.D.   On: 08/22/2013 00:25    Impression/Plan: 72 yo with melena concerning for an upper GI bleed in the setting of a supratherapeutic INR. Correcting coagulopathy. Bedside EGD. Protonix drip. Volume resuscitate. Supportive care.    LOS: 1 day   Ronold Hardgrove C.  08/19/2013, 12:43 PM   

## 2013-08-21 NOTE — Progress Notes (Signed)
[facility]. Noted that patient is from SNF. [facility] and not [facility]. [facility]. Raiford NobleAtika Amatullah Christy, MSN, RN, BSN- [facility] Liaison--[phone]

## 2013-08-21 NOTE — H&P (Signed)
[facility]                                                                                    Patient Demographics  Jared Landry, is a 73 y.o. male  MRN: [id number]   DOB - 27-Apr-1941  Admit Date - 08/26/2013  Outpatient Primary MD for the patient is Georgann Housekeeper, MD   With History of -  Past Medical History  Diagnosis Date  . Atrial fibrillation     Cardioversion 07/20/11  transiently <3h successful  . CAD (coronary artery disease)     s/p drug eluting stent  . Diabetes mellitus     type II  . Hypertension   . Gout   . Osteoarthritis   . COPD (chronic obstructive pulmonary disease)   . 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 Jude  . Ventricular tachycardia     terminated by the ICD  . Chronic systolic heart failure   . CHF (congestive heart failure)   . Automatic implantable cardioverter-defibrillator in situ   . Shortness of breath   . Neuromuscular disorder     DIABETIC NEUROPATHY      Past Surgical History  Procedure Laterality Date  . Appendectomy    . Tonsillectomy    . Vasectomy    . Surgical i &d after brown recluse inner thigh    . Cardiac defibrillator placement      St. Jude   . Cardioversion  07/20/2011    Procedure: CARDIOVERSION;  Surgeon: Donato Schultz, MD;  Location: [facility] OR;  Service: Cardiovascular;  Laterality: N/A;  . Cardioversion  01/18/2012    Procedure: CARDIOVERSION;  Surgeon: Donato Schultz, MD;  Location: [facility] OR;  Service: Cardiovascular;  Laterality: N/A;  . Tee without cardioversion N/A 07/22/2013    Procedure: TRANSESOPHAGEAL ECHOCARDIOGRAM (TEE);  Surgeon: Vesta Mixer, MD;  Location: [facility] ENDOSCOPY;  Service: Cardiovascular;  Laterality: N/A;    in for   Chief Complaint  Patient presents with  . Diarrhea  . Weakness     HPI  Jared Landry  is a 73 y.o. male, with history of atrial fibrillation on Coumadin, chronic systolic heart failure with biventricular pacemaker, hypertension, COPD, recently  admitted for enterococcal bacteremia and discharged with right arm PICC line on 4 weeks of IV ampicillin 4 times a day which is supposed to finish tomorrow per wife, currently living in a nursing home has been brought in from nursing home for 5-6 day history of diarrhea, patient also reports that he has noticed black-colored stool mixed with some blood from time to time for the last 5-6 days, he's been getting progressively weak, appears pale. In the ER his presentation and workup were consistent of possible C. difficile colitis, anemia, hypotension, dehydration, acute renal failure, generalized weakness, cachexia and I was called to admit the patient.  Patient denies any abdominal pain, no nausea vomiting, some generalized abdominal take, no focal weakness, no chest pain, no cough phlegm, no fever chills.    Review of Systems    In addition to the HPI above,   No Fever-chills, No Headache, No changes with Vision or hearing, No problems swallowing  food or Liquids, No Chest pain, Cough or Shortness of Breath, No Abdominal pain, No Nausea or Vommitting, ++ diarrhea, +ve black stool mixed with some blood for the last 5-6 days, No dysuria, No new skin rashes or bruises, No new joints pains-aches,  No new weakness, tingling, numbness in any extremity, No recent weight gain or loss, No polyuria, polydypsia or polyphagia, No significant Mental Stressors.  A full 10 point Review of Systems was done, except as stated above, all other Review of Systems were negative.   Social History History  Substance Use Topics  . Smoking status: Former Smoker -- 0.30 packs/day    Types: Cigarettes    Quit date: 10/29/2006  . Smokeless tobacco: Never Used  . Alcohol Use: 16.8 oz/week    28 Shots of liquor per week      Family History Family History  Problem Relation Age of Onset  . Heart failure Mother   . Diabetes type II Sister   . Appendicitis Father     rupture      Prior to Admission  medications   Medication Sig Start Date End Date Taking? Authorizing Provider  acetaminophen (TYLENOL) 325 MG tablet Take 2 tablets (650 mg total) by mouth every 6 (six) hours as needed for mild pain or fever. 07/23/13  Yes Georgann Housekeeper, MD  albuterol (PROVENTIL HFA;VENTOLIN HFA) 108 (90 BASE) MCG/ACT inhaler Inhale 2 puffs into the lungs every 6 (six) hours as needed. For shortness of breath.   Yes Historical Provider, MD  albuterol (PROVENTIL) (5 MG/ML) 0.5% nebulizer solution Take 0.5 mLs (2.5 mg total) by nebulization every 6 (six) hours as needed for wheezing or shortness of breath. 07/23/13  Yes Georgann Housekeeper, MD  allopurinol (ZYLOPRIM) 100 MG tablet Take 1 tablet (100 mg total) by mouth daily. Along with 300 mg - total dose of 400 mg of Allopurinol daily. 07/23/13  Yes Georgann Housekeeper, MD  allopurinol (ZYLOPRIM) 300 MG tablet Take 300 mg by mouth daily.   Yes Historical Provider, MD  amiodarone (PACERONE) 200 MG tablet Take 1 tablet (200 mg total) by mouth daily. 07/07/13 07/07/14 Yes Georgann Housekeeper, MD  aspirin EC 81 MG tablet Take 81 mg by mouth daily.   Yes Historical Provider, MD  Astaxanthin 4 MG CAPS Take 1 capsule by mouth daily.   Yes Historical Provider, MD  atorvastatin (LIPITOR) 10 MG tablet Take 5 mg by mouth daily.    Yes Historical Provider, MD  b complex vitamins tablet Take 1 tablet by mouth every other day. Alternate with Zinc   Yes Historical Provider, MD  cholecalciferol (VITAMIN D) 1000 UNITS tablet Take 2,000 Units by mouth every other day.    Yes Historical Provider, MD  Coenzyme Q10 (CO Q-10) 100 MG CAPS Take 1 tablet by mouth daily. Alternate with Magnesium   Yes Historical Provider, MD  colchicine 0.6 MG tablet Take 1 tablet (0.6 mg total) by mouth daily. 07/08/13  Yes Georgann Housekeeper, MD  Fluticasone-Salmeterol (ADVAIR) 100-50 MCG/DOSE AEPB Inhale 1 puff into the lungs 2 (two) times daily as needed.    Yes Historical Provider, MD  furosemide (LASIX) 40 MG tablet Take 40 mg  by mouth 2 (two) times daily.   Yes Historical Provider, MD  levothyroxine (SYNTHROID, LEVOTHROID) 50 MCG tablet Take 1 tablet (50 mcg total) by mouth daily before breakfast. 07/07/13  Yes Georgann Housekeeper, MD  Magnesium 300 MG CAPS Take 1 capsule by mouth every other day.    Yes Historical Provider, MD  metoprolol succinate (TOPROL-XL) 100 MG 24 hr tablet Take 50 mg by mouth daily. Take with or immediately following a meal.   Yes Historical Provider, MD  polyethylene glycol (MIRALAX / GLYCOLAX) packet Take 17 g by mouth daily. 07/23/13  Yes Georgann Housekeeper, MD  potassium chloride (K-DUR) 10 MEQ tablet Take 10 mEq by mouth daily.   Yes Historical Provider, MD  sodium chloride 0.9 % SOLN 50 mL with ampicillin 2 G SOLR 2 g Inject 2 g into the vein every 6 (six) hours. FOR TOTAL OF 4 WEEKS 07/23/13  Yes Georgann Housekeeper, MD  Zinc 100 MG TABS Take 1 tablet by mouth every other day. Alternate with B Complex   Yes Historical Provider, MD  warfarin (COUMADIN) 4 MG tablet 1 tablet ( 4 mg) daily; except 1/2 tablet ( 2 mg) on mondays and fridays 07/07/13   Georgann Housekeeper, MD    Allergies  Allergen Reactions  . Gabapentin Other (See Comments)    May cause tremors and shaking  . Pregabalin     Makes confused    Physical Exam  Vitals  Blood pressure 95/60, pulse 84, temperature 97.7 F (36.5 C), temperature source Oral, resp. rate 19, SpO2 93.00%.   1. General frail , pale, cachectic, elderly white male lying in bed in NAD,     2. Normal affect and insight, Not Suicidal or Homicidal, Awake Alert, Oriented X 3.  3. No F.N deficits, ALL C.Nerves Intact, Strength 5/5 all 4 extremities, Sensation intact all 4 extremities, Plantars down going.  4. Ears and Eyes appear Normal, Conjunctivae clear, PERRLA. Dry Oral Mucosa.  5. Supple Neck, No JVD, No cervical lymphadenopathy appriciated, No Carotid Bruits.  6. Symmetrical Chest wall movement, Good air movement bilaterally, CTAB.  7. RRR, No Gallops, Rubs or  Murmurs, No Parasternal Heave.  8. Positive Bowel Sounds, Abdomen Soft, Non tender, No organomegaly appriciated,No rebound -guarding or rigidity.  9.  No Cyanosis, Normal Skin Turgor, No Skin Rash or Bruise.  10. Good muscle tone,  joints appear normal , no effusions, Normal ROM.  11. No Palpable Lymph Nodes in Neck or Axillae     Data Review  CBC  Recent Labs Lab 08/18/2013 2315  WBC 14.2*  HGB 6.8*  HCT 18.8*  PLT 229  MCV 92.2  MCH 33.3  MCHC 36.2*  RDW 20.7*  LYMPHSABS 1.3  MONOABS 1.0  EOSABS 0.3  BASOSABS 0.1   ------------------------------------------------------------------------------------------------------------------  Chemistries   Recent Labs Lab 09/04/2013 2315  NA 127*  K 4.5  CL 90*  CO2 21  GLUCOSE 97  BUN 55*  CREATININE 3.25*  CALCIUM 8.1*  AST 63*  ALT 43  ALKPHOS 227*  BILITOT 1.9*   ------------------------------------------------------------------------------------------------------------------ CrCl is unknown because both a height and weight (above a minimum accepted value) are required for this calculation. ------------------------------------------------------------------------------------------------------------------ No results found for this basename: TSH, T4TOTAL, FREET3, T3FREE, THYROIDAB,  in the last 72 hours   Coagulation profile  Recent Labs Lab 08/11/2013 2315  INR 4.90*   ------------------------------------------------------------------------------------------------------------------- No results found for this basename: DDIMER,  in the last 72 hours -------------------------------------------------------------------------------------------------------------------  Cardiac Enzymes No results found for this basename: CK, CKMB, TROPONINI, MYOGLOBIN,  in the last 168 hours ------------------------------------------------------------------------------------------------------------------ No components found with this  basename: POCBNP,    ---------------------------------------------------------------------------------------------------------------  Urinalysis    Component Value Date/Time   COLORURINE YELLOW 07/18/2013 2330   APPEARANCEUR CLOUDY* 07/18/2013 2330   LABSPEC 1.015 07/18/2013 2330   PHURINE 5.0 07/18/2013 2330   GLUCOSEU  NEGATIVE 07/18/2013 2330   HGBUR LARGE* 07/18/2013 2330   BILIRUBINUR SMALL* 07/18/2013 2330   KETONESUR NEGATIVE 07/18/2013 2330   PROTEINUR 100* 07/18/2013 2330   UROBILINOGEN 0.2 07/18/2013 2330   NITRITE NEGATIVE 07/18/2013 2330   LEUKOCYTESUR NEGATIVE 07/18/2013 2330    ----------------------------------------------------------------------------------------------------------------  Imaging results:   Ir US Guide Vasc Access Right  07/22/2013   CLINICAL DATA:  Patient with bacteremia, need PICC for antibiotics, patient denies any history of kidney dysfunction.  EXAM: Right dual lumen power injectable PICC LINE PLACEMENT WITH ULTRASOUND AND FLUOROSCOPIC GUIDANCE  FLUOROSCOPY TIME:  0.3 minutes  PROCEDURE: The patient was advised of the possible risks and complications and agreed to undergo the procedure. The patient was then brought to the angiographic suite for the procedure.  The right arm was prepped with chlorhexidine, draped in the usual sterile fashion using maximum barrier technique (cap and mask, sterile gown, sterile gloves, large sterile sheet, hand hygiene and cutaneous antisepsis) and infiltrated locally with 1% Lidocaine.  Ultrasound demonstrated patency of the right basilic vein, and this was documented with an image. Under real-time ultrasound guidance, this vein was accessed with a 21 gauge micropuncture needle and image documentation was performed. A 0.018 wire was introduced in to the vein. Over this, a 5 [place] double lumen power-injectable PICC was advanced to the lower SVC/right atrial junction. Fluoroscopy during the procedure and fluoro spot  radiograph confirms appropriate catheter position. The catheter was flushed and covered with a sterile dressing.  Complications: none.  IMPRESSION: Successful right arm power injectable dual lumen PICC line placement with ultrasound and fluoroscopic guidance. The catheter is ready for use.  Read By:  Pattricia Boss PA-C   Electronically Signed   By: Irish Lack M.D.   On: 07/22/2013 13:15   Dg Chest Port 1 View  08/08/2013   CLINICAL DATA:  Generalized weakness. Shortness of breath. Diarrhea.  EXAM: PORTABLE CHEST - 1 VIEW  COMPARISON:  Portable examination 07/18/2013, 11/06/2009 and two-view chest x-ray 07/16/2013.  FINDINGS: Cardiac silhouette moderately enlarged but stable. Biventricular pacing defibrillator unchanged. Right arm PICC tip projects over the lower SVC at or near the cavoatrial junction. Consolidation in the left lower lobe with silhouetting of the left hemidiaphragm. Lungs otherwise clear. Pulmonary vascularity normal. No pneumothorax.  IMPRESSION: 1. Left lower lobe atelectasis and/or pneumonia. 2. Stable moderate cardiomegaly without evidence of pulmonary edema.   Electronically Signed   By: Hulan Saas M.D.   On: 08/29/2013 00:25   Ir Fluoro Guide Cv Midline Picc Right  07/22/2013   CLINICAL DATA:  Patient with bacteremia, need PICC for antibiotics, patient denies any history of kidney dysfunction.  EXAM: Right dual lumen power injectable PICC LINE PLACEMENT WITH ULTRASOUND AND FLUOROSCOPIC GUIDANCE  FLUOROSCOPY TIME:  0.3 minutes  PROCEDURE: The patient was advised of the possible risks and complications and agreed to undergo the procedure. The patient was then brought to the angiographic suite for the procedure.  The right arm was prepped with chlorhexidine, draped in the usual sterile fashion using maximum barrier technique (cap and mask, sterile gown, sterile gloves, large sterile sheet, hand hygiene and cutaneous antisepsis) and infiltrated locally with 1% Lidocaine.   Ultrasound demonstrated patency of the right basilic vein, and this was documented with an image. Under real-time ultrasound guidance, this vein was accessed with a 21 gauge micropuncture needle and image documentation was performed. A 0.018 wire was introduced in to the vein. Over this, a 5 [place] double lumen power-injectable PICC was advanced  to the lower SVC/right atrial junction. Fluoroscopy during the procedure and fluoro spot radiograph confirms appropriate catheter position. The catheter was flushed and covered with a sterile dressing.  Complications: none.  IMPRESSION: Successful right arm power injectable dual lumen PICC line placement with ultrasound and fluoroscopic guidance. The catheter is ready for use.  Read By:  Pattricia BossKoreen Morgan PA-C   Electronically Signed   By: Irish LackGlenn  Yamagata M.D.   On: 07/22/2013 13:15    My personal review of EKG: Rhythm paced rythm   Assessment & Plan   1. Severe anemia due to GI bleed in the setting of supratherapeutic INR. Unclear upper versus lower GI bleed, he does not have upper abdominal discomfort nausea or vomiting, BUN is slightly elevated, also picture is consistent with C. difficile colitis. At this time we'll admit him to step down bed, IV fluids for recess sedation and improving blood pressure, hold diuretics, will give him vitamin K and FFP, 2 units of packed RBC now, place him on IV PPI. Have requested GI physician Dr. Sharla KidneyPirtle to see the patient.    2. Severe diarrhea could be due to #1 also suspect C. difficile as she has had prolonged exposure to IV antibiotics. Will send stool for PCR, place him on oral vancomycin.    3. Recent enterococcal bacteremia. Finishing IV ampicillin tomorrow will finish the course. Redraw 2 sets of blood cultures. Stop antibiotics as soon as possible.    4. Chronic systolic heart failure with AICD placement. No acute issues since blood pressure is soft we'll leave him on amiodarone, hold beta blocker for  now.    5. Acute renal failure with hyponatremia. Patient appears to be extremely dehydrated, also severely anemic, IV fluids and packed RBC transfusion. Urine electrolytes. Repeat BMP in the morning.    6. Atrial fibrillation. Continue amiodarone, telemetry monitor, hold Coumadin for #1 above. He will be at risk for CVA or stroke, this was explained clearly to patient and wife bedside. Both understand full risks and benefits.     DVT Prophylaxis   SCDs    AM Labs Ordered, also please review Full Orders  Family Communication: Admission, patients condition and plan of care including tests being ordered have been discussed with the patient and wife (wife requested that the plan of care and CODE status be not discussed with the patient, however she was told that legally if patient is competent we cannot withhold information from him, ER nurse also present bedside) who indicate understanding and agree with the plan and Code Status.  Code Status No Intubation  Likely DC to  SNF  Condition GUARDED+++  Time spent in minutes : 45    SINGH,PRASHANT K M.D on 08/13/2013 at 12:59 AM  Between 7am to 7pm - Pager - [phone]  After 7pm go to [email or web address] - password [email or web address]  And look for the night coverage person covering me after hours  [facility] Office  [phone]

## 2013-08-21 NOTE — Op Note (Signed)
[facility] [address] [place], [id number]   ENDOSCOPY PROCEDURE REPORT  PATIENT: Jared Landry, Jared E.  MR#: #[id number] BIRTHDATE: 01/25/1941 , 72  yrs. old GENDER: Male  ENDOSCOPIST: Charlott RakesVincent Murrel Bertram, MD REFERRED OZ:[id number]:hospital team  PROCEDURE DATE:  08/08/2013 PROCEDURE:   EGD, diagnostic ASA CLASS:   Class IV INDICATIONS:Melena. MEDICATIONS: Versed 1mg  IV  TOPICAL ANESTHETIC: none  DESCRIPTION OF PROCEDURE:   After the risks benefits and alternatives of the procedure were thoroughly explained, informed consent was obtained.  The Pentax Gastroscope [id number]  endoscope was introduced through the mouth and advanced to the second portion of the duodenum , limited by Without limitations.   The instrument was slowly withdrawn as the mucosa was fully examined.     FINDINGS: The endoscope was inserted into the oropharynx and esophagus was intubated.  The esophagus and the gastroesophageal junction were normal. Endoscope was advanced into the stomach, which revealed a large amount of food products in the proximal stomach preventing visualization of that part of the stomach. In the antrum there was scattered edema and a small ulceration. No bleeding was seen. The endoscope was advanced to the duodenal bulb and second portion of duodenum which were unremarkable and clear bilious fluid was seen.  The endoscope was withdrawn back into the stomach and retroflexion again showed the bezoar of food, which obscured the fundus and most of the cardia.  COMPLICATIONS: None  ENDOSCOPIC IMPRESSION:     Large bezoar of food in proximal stomach No active bleeding and no blood products seen Small nonbleeding ulceration in antrum Suspect melena due to mucosal bleeding in small intestine (not seen on this exam) from elevated INR  RECOMMENDATIONS: Correct coagulopathy; Supportive care; NPO   REPEAT EXAM: N/A  _______________________________ Charlott RakesVincent Hughes Wyndham, MD eSigned:   Charlott RakesVincent Toniya Rozar, MD 08/26/2013 2:48 PM    CC:  PATIENT NAME:  Jared Landry, Jared E. MR#: #[id number]

## 2013-08-21 NOTE — Consult Note (Signed)
Reason for Consult: hypotension with ICM and BiVICD.     Referring Physician: Dr. Lysle Rubens PCP: Wenda Low, MD Primary Cardiologist:Dr. Lillie Bollig is an 73 y.o. male.    Chief Complaint: Admitted 08/18/2013 early morning hours with diarrhea and treatment for c. Diff. And weakness.     HPI: 73 y.o. male, with history of atrial fibrillation on Coumadin, chronic systolic heart failure with biventricular pacemaker, hypertension, COPD, recently admitted for enterococcal bacteremia and discharged with right arm PICC line on 4 weeks of IV ampicillin 4 times a day which is supposed to finish tomorrow. TEE was done during that hospitalization. There was no obvious vegetation. Ejection fraction earlier in the month by standard echo was in the 30-35% range. Ejection fraction reading from the TEE was read as 35-40%. Per wife, currently living in a nursing home has been brought in from nursing home for 5-6 day history of diarrhea, patient also reports that he has noticed black-colored stool mixed with some blood from time to time for the last 5-6 days, he's been getting progressively weak, appears pale. In the ER his presentation and workup were consistent of possible C. difficile colitis- though culture was negative for c. Diff. anemia, hypotension, dehydration, acute renal failure, generalized weakness, cachexia.  No chest pain or SOB currently.    H/H low 6.8/18.8; INR 4.9; stool was heme negative.  A febrile.  He has been given Protonix drip, Vit K 2 mg, FFP and transfused 2 U PRBCs.  Hgb up to 8.  He was also hypotensive to the 84Z systolic this am.  Pacing 97% of the time.  EKG AV pacing.  He has St jude BI V. ICD.  Bedside EGD with gastritis no active bleeding. At the time of the recent TEE, no vegetation and mod-severe TR.  Pt has had multiple fluid boluses, +2807.  CXR on admit Left lower lobe atelectasis and/or pneumonia, stable moderate cardiomegaly without evidence of pulmonary  edema.   The patient has been on amiodarone. When seen in the office earlier in December, 2014, there was sinus rhythm.      Past Medical History  Diagnosis Date  . Atrial fibrillation     Cardioversion 07/20/11  transiently <3h successful  . CAD (coronary artery disease)     s/p drug eluting stent  . Diabetes mellitus     type II  . Hypertension   . Gout   . Osteoarthritis   . COPD (chronic obstructive pulmonary disease)   . 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 Jude  . Ventricular tachycardia     terminated by the ICD  . Chronic systolic heart failure   . CHF (congestive heart failure)   . Automatic implantable cardioverter-defibrillator in situ   . Shortness of breath   . Neuromuscular disorder     DIABETIC NEUROPATHY    Past Surgical History  Procedure Laterality Date  . Appendectomy    . Tonsillectomy    . Vasectomy    . Surgical i &d after brown recluse inner thigh    . Cardiac defibrillator placement      [employer] Jude   . Cardioversion  07/20/2011    Procedure: CARDIOVERSION;  Surgeon: Candee Furbish, MD;  Location: [place] [facility];  Service: Cardiovascular;  Laterality: N/A;  . Cardioversion  01/18/2012    Procedure: CARDIOVERSION;  Surgeon: Candee Furbish, MD;  Location: [place];  Service: Cardiovascular;  Laterality: N/A;  .  Tee without cardioversion N/A 07/22/2013    Procedure: TRANSESOPHAGEAL ECHOCARDIOGRAM (TEE);  Surgeon: Thayer Headings, MD;  Location: [facility];  Service: Cardiovascular;  Laterality: N/A;    Family History  Problem Relation Age of Onset  . Heart failure Mother   . Diabetes type II Sister   . Appendicitis Father     rupture   Social History:  reports that he quit smoking about 6 years ago. His smoking use included Cigarettes. He smoked 0.30 packs per day. He has never used smokeless tobacco. He reports that he drinks about 16.8 ounces of alcohol per week. He reports that he does not use illicit drugs.  Allergies:   Allergies  Allergen Reactions  . Gabapentin Other (See Comments)    May cause tremors and shaking  . Pregabalin     Makes confused    Medications Prior to Admission  Medication Sig Dispense Refill  . acetaminophen (TYLENOL) 325 MG tablet Take 2 tablets (650 mg total) by mouth every 6 (six) hours as needed for mild pain or fever.  30 tablet  3  . albuterol (PROVENTIL HFA;VENTOLIN HFA) 108 (90 BASE) MCG/ACT inhaler Inhale 2 puffs into the lungs every 6 (six) hours as needed. For shortness of breath.      Marland Kitchen albuterol (PROVENTIL) (5 MG/ML) 0.5% nebulizer solution Take 0.5 mLs (2.5 mg total) by nebulization every 6 (six) hours as needed for wheezing or shortness of breath.  20 mL  12  . allopurinol (ZYLOPRIM) 100 MG tablet Take 1 tablet (100 mg total) by mouth daily. Along with 300 mg - total dose of 400 mg of Allopurinol daily.  30 tablet  0  . allopurinol (ZYLOPRIM) 300 MG tablet Take 300 mg by mouth daily.      Marland Kitchen amiodarone (PACERONE) 200 MG tablet Take 1 tablet (200 mg total) by mouth daily.  30 tablet  12  . aspirin EC 81 MG tablet Take 81 mg by mouth daily.      . Astaxanthin 4 MG CAPS Take 1 capsule by mouth daily.      Marland Kitchen atorvastatin (LIPITOR) 10 MG tablet Take 5 mg by mouth daily.       Marland Kitchen b complex vitamins tablet Take 1 tablet by mouth every other day. Alternate with Zinc      . cholecalciferol (VITAMIN D) 1000 UNITS tablet Take 2,000 Units by mouth every other day.       . Coenzyme Q10 (CO Q-10) 100 MG CAPS Take 1 tablet by mouth daily. Alternate with Magnesium      . colchicine 0.6 MG tablet Take 1 tablet (0.6 mg total) by mouth daily.  30 tablet  3  . Fluticasone-Salmeterol (ADVAIR) 100-50 MCG/DOSE AEPB Inhale 1 puff into the lungs 2 (two) times daily as needed.       . furosemide (LASIX) 40 MG tablet Take 40 mg by mouth 2 (two) times daily.      Marland Kitchen levothyroxine (SYNTHROID, LEVOTHROID) 50 MCG tablet Take 1 tablet (50 mcg total) by mouth daily before breakfast.  30 tablet  3  .  Magnesium 300 MG CAPS Take 1 capsule by mouth every other day.       . metoprolol succinate (TOPROL-XL) 100 MG 24 hr tablet Take 50 mg by mouth daily. Take with or immediately following a meal.      . polyethylene glycol (MIRALAX / GLYCOLAX) packet Take 17 g by mouth daily.  14 each  0  . potassium chloride (K-DUR) 10 MEQ  tablet Take 10 mEq by mouth daily.      . sodium chloride 0.9 % SOLN 50 mL with ampicillin 2 G SOLR 2 g Inject 2 g into the vein every 6 (six) hours. FOR TOTAL OF 4 WEEKS  30 g  1  . Zinc 100 MG TABS Take 1 tablet by mouth every other day. Alternate with B Complex      . warfarin (COUMADIN) 4 MG tablet 1 tablet ( 4 mg) daily; except 1/2 tablet ( 2 mg) on mondays and fridays  30 tablet  3    Results for orders placed during the hospital encounter of 08/14/2013 (from the past 48 hour(s))  CBC WITH DIFFERENTIAL     Status: Abnormal   Collection Time    08/22/2013 11:15 PM      Result Value Range   WBC 14.2 (*) 4.0 - 10.5 K/uL   RBC 2.04 (*) 4.22 - 5.81 MIL/uL   Hemoglobin 6.8 (*) 13.0 - 17.0 g/dL   Comment: REPEATED TO VERIFY     CRITICAL RESULT CALLED TO, READ BACK BY AND VERIFIED WITH:     ADENNIS RN AT [id number] ON [id number] BY DLONG   HCT 18.8 (*) 39.0 - 52.0 %   MCV 92.2  78.0 - 100.0 fL   MCH 33.3  26.0 - 34.0 pg   MCHC 36.2 (*) 30.0 - 36.0 g/dL   RDW 20.7 (*) 11.5 - 15.5 %   Platelets 229  150 - 400 K/uL   Neutrophils Relative % 82 (*) 43 - 77 %   Neutro Abs 11.6 (*) 1.7 - 7.7 K/uL   Lymphocytes Relative 9 (*) 12 - 46 %   Lymphs Abs 1.3  0.7 - 4.0 K/uL   Monocytes Relative 7  3 - 12 %   Monocytes Absolute 1.0  0.1 - 1.0 K/uL   Eosinophils Relative 2  0 - 5 %   Eosinophils Absolute 0.3  0.0 - 0.7 K/uL   Basophils Relative 0  0 - 1 %   Basophils Absolute 0.1  0.0 - 0.1 K/uL  COMPREHENSIVE METABOLIC PANEL     Status: Abnormal   Collection Time    08/07/2013 11:15 PM      Result Value Range   Sodium 127 (*) 137 - 147 mEq/L   Potassium 4.5  3.7 - 5.3 mEq/L   Chloride 90  (*) 96 - 112 mEq/L   CO2 21  19 - 32 mEq/L   Glucose, Bld 97  70 - 99 mg/dL   BUN 55 (*) 6 - 23 mg/dL   Creatinine, Ser 3.25 (*) 0.50 - 1.35 mg/dL   Calcium 8.1 (*) 8.4 - 10.5 mg/dL   Total Protein 5.4 (*) 6.0 - 8.3 g/dL   Albumin 2.3 (*) 3.5 - 5.2 g/dL   AST 63 (*) 0 - 37 U/L   ALT 43  0 - 53 U/L   Alkaline Phosphatase 227 (*) 39 - 117 U/L   Total Bilirubin 1.9 (*) 0.3 - 1.2 mg/dL   GFR calc non Af Amer 17 (*) >90 mL/min   GFR calc Af Amer 20 (*) >90 mL/min   Comment: (NOTE)     The eGFR has been calculated using the CKD EPI equation.     This calculation has not been validated in all clinical situations.     eGFR's persistently <90 mL/min signify possible Chronic Kidney     Disease.  TYPE AND SCREEN     Status: None   Collection  Time    08/10/2013 11:15 PM      Result Value Range   ABO/RH(D) A POS     Antibody Screen NEG     Sample Expiration 08-28-13     Unit Number [id number]     Blood Component Type RED CELLS,LR     Unit division 00     Status of Unit ISSUED     Transfusion Status OK TO TRANSFUSE     Crossmatch Result Compatible     Unit Number [id number]     Blood Component Type RED CELLS,LR     Unit division 00     Status of Unit ISSUED     Transfusion Status OK TO TRANSFUSE     Crossmatch Result Compatible    PROTIME-INR     Status: Abnormal   Collection Time    09/04/2013 11:15 PM      Result Value Range   Prothrombin Time 43.7 (*) 11.6 - 15.2 seconds   INR 4.90 (*) 0.00 - 1.49  PREPARE RBC (CROSSMATCH)     Status: None   Collection Time    09/06/2013 12:00 AM      Result Value Range   Order Confirmation ORDER PROCESSED BY BLOOD BANK    LACTIC ACID, PLASMA     Status: None   Collection Time    08/09/2013 12:03 AM      Result Value Range   Lactic Acid, Venous 1.4  0.5 - 2.2 mmol/L  PREPARE FRESH FROZEN PLASMA     Status: None   Collection Time    08/18/2013  1:00 AM      Result Value Range   Unit Number [id number]     Blood Component Type THAWED  PLASMA     Unit division 00     Status of Unit ISSUED     Transfusion Status OK TO TRANSFUSE     Unit Number [id number]     Blood Component Type THAWED PLASMA     Unit division 00     Status of Unit ALLOCATED     Transfusion Status OK TO TRANSFUSE    MRSA PCR SCREENING     Status: None   Collection Time    08/22/2013  1:53 AM      Result Value Range   MRSA by PCR NEGATIVE  NEGATIVE   Comment:            The GeneXpert MRSA Assay (FDA     approved for NASAL specimens     only), is one component of a     comprehensive MRSA colonization     surveillance program. It is not     intended to diagnose MRSA     infection nor to guide or     monitor treatment for     MRSA infections.  CREATININE, URINE, RANDOM     Status: None   Collection Time    08/22/2013  4:00 AM      Result Value Range   Creatinine, Urine 116.82     Comment: Performed at [facility]  SODIUM, URINE, RANDOM     Status: None   Collection Time    09/06/2013  4:00 AM      Result Value Range   Sodium, Ur <20     Comment: Performed at [place], URINE     Status: Abnormal   Collection Time    08/28/2013  4:00 AM      Result Value Range  Osmolality, Ur 310 (*) 390 - 1090 mOsm/kg   Comment: Performed at [place]     Status: Abnormal   Collection Time    08/09/2013  4:00 AM      Result Value Range   Color, Urine AMBER (*) YELLOW   Comment: BIOCHEMICALS MAY BE AFFECTED BY COLOR   APPearance CLEAR  CLEAR   Specific Gravity, Urine 1.023  1.005 - 1.030   pH 5.0  5.0 - 8.0   Glucose, UA NEGATIVE  NEGATIVE mg/dL   Hgb urine dipstick NEGATIVE  NEGATIVE   Bilirubin Urine SMALL (*) NEGATIVE   Ketones, ur NEGATIVE  NEGATIVE mg/dL   Protein, ur 30 (*) NEGATIVE mg/dL   Urobilinogen, UA 1.0  0.0 - 1.0 mg/dL   Nitrite NEGATIVE  NEGATIVE   Leukocytes, UA NEGATIVE  NEGATIVE   WBC, UA 0-2  <3 WBC/hpf   RBC / HPF 0-2  <3 RBC/hpf   Bacteria, UA  RARE  RARE   Squamous Epithelial / LPF RARE  RARE   Casts HYALINE CASTS (*) NEGATIVE  OCCULT BLOOD X 1 CARD TO LAB, STOOL     Status: None   Collection Time    08/18/2013  4:38 AM      Result Value Range   Fecal Occult Bld NEGATIVE  NEGATIVE  CLOSTRIDIUM DIFFICILE BY PCR     Status: None   Collection Time    08/31/2013  8:29 AM      Result Value Range   C difficile by pcr NEGATIVE  NEGATIVE   Comment: Performed at [place]     Status: None   Collection Time    08/27/2013 11:45 AM      Result Value Range   Magnesium 2.0  1.5 - 2.5 mg/dL  PROTIME-INR     Status: Abnormal   Collection Time    08/22/2013 11:45 AM      Result Value Range   Prothrombin Time 28.4 (*) 11.6 - 15.2 seconds   INR 2.78 (*) 0.00 - 6.21  BASIC METABOLIC PANEL     Status: Abnormal   Collection Time    08/24/2013 11:45 AM      Result Value Range   Sodium 132 (*) 137 - 147 mEq/L   Potassium 4.6  3.7 - 5.3 mEq/L   Chloride 96  96 - 112 mEq/L   CO2 19  19 - 32 mEq/L   Glucose, Bld 105 (*) 70 - 99 mg/dL   BUN 58 (*) 6 - 23 mg/dL   Creatinine, Ser 3.35 (*) 0.50 - 1.35 mg/dL   Calcium 7.5 (*) 8.4 - 10.5 mg/dL   GFR calc non Af Amer 17 (*) >90 mL/min   GFR calc Af Amer 19 (*) >90 mL/min   Comment: (NOTE)     The eGFR has been calculated using the CKD EPI equation.     This calculation has not been validated in all clinical situations.     eGFR's persistently <90 mL/min signify possible Chronic Kidney     Disease.  CBC     Status: Abnormal   Collection Time    09/06/2013 11:45 AM      Result Value Range   WBC 13.7 (*) 4.0 - 10.5 K/uL   RBC 2.62 (*) 4.22 - 5.81 MIL/uL   Hemoglobin 8.4 (*) 13.0 - 17.0 g/dL   Comment: REPEATED TO VERIFY     DELTA CHECK NOTED  POST TRANSFUSION SPECIMEN   HCT 23.1 (*) 39.0 - 52.0 %   MCV 88.2  78.0 - 100.0 fL   MCH 32.1  26.0 - 34.0 pg   MCHC 36.4 (*) 30.0 - 36.0 g/dL   RDW 19.1 (*) 11.5 - 15.5 %   Platelets 202  150 - 400 K/uL  ABO/RH     Status: None    Collection Time    08/12/2013 11:15 PM      Result Value Range   ABO/RH(D) A POS     Dg Chest Port 1 View  08/20/2013   CLINICAL DATA:  Generalized weakness. Shortness of breath. Diarrhea.  EXAM: PORTABLE CHEST - 1 VIEW  COMPARISON:  Portable examination 07/18/2013, 11/06/2009 and two-view chest x-ray 07/16/2013.  FINDINGS: Cardiac silhouette moderately enlarged but stable. Biventricular pacing defibrillator unchanged. Right arm PICC tip projects over the lower SVC at or near the cavoatrial junction. Consolidation in the left lower lobe with silhouetting of the left hemidiaphragm. Lungs otherwise clear. Pulmonary vascularity normal. No pneumothorax.  IMPRESSION: 1. Left lower lobe atelectasis and/or pneumonia. 2. Stable moderate cardiomegaly without evidence of pulmonary edema.   Electronically Signed   By: Evangeline Dakin M.D.   On: 08/12/2013 00:25    ROS: General:no colds or fevers, no weight changes, extreme weakness Skin:no rashes or ulcers HEENT:no blurred vision, no congestion CV:see HPI PUL:see HPI GI:+ diarrhea no constipation or melena, no indigestion GU:no hematuria, no dysuria MS:no joint pain, no claudication Neuro:no syncope, no lightheadedness, + weakness Endo:no diabetes, + thyroid disease   Blood pressure 80/53, pulse 99, temperature 97.3 F (36.3 C), temperature source Axillary, resp. rate 14, height $RemoveBe'5\' 8"'dzGipKmDa$  (1.727 m), weight 164 lb 3.9 oz (74.5 kg), SpO2 94.00%. PE: General:Pleasant affect, obviously weak Skin:Warm and dry, brisk capillary refill HEENT:normocephalic, sclera clear, mucus membranes dry Neck:supple, + JVD, no bruits  Heart:S1S2 RRR without murmur, gallup, rub or click Lungs:clear to diminished without rales, rhonchi, or wheezes TFT:DDUK, mild tenderness, + BS, do not palpate liver spleen or masses Ext:1+ lower ext edema, 1+ pedal pulses, 2+ radial pulses, SCD stockings in place Neuro:alert and oriented, MAE, follows commands, + facial symmetry     Assessment/Plan    GIB (gastrointestinal bleeding)      The patient is being assessed for the etiology of GI bleeding.    DIABETES MELLITUS, TYPE II      SYSTOLIC HEART FAILURE, CHRONIC      DURING THIS ADMISSION THE PATIENT HAS NOT BEEN VOLUME OVERLOADED. HE HAS RECEIVED BLOOD. HE IS RECEIVING IV FLUIDS. THERE NO OBVIOUS RALES AT THIS TIME.     GERD     Ischemic cardiomyopathy     Recent echo in early December, 2014 revealed an EF of 30-35%. EF was read as somewhat higher at the time of his TEE later in the month of December, 2014. At this time the patient's blood pressure is low and medications for his cardiomyopathy are on hold.     Biventricular implantable cardioverter-defibrillator in situ      Patient will be interrogated. To be sure that the rhythm is sinus.     ARF (acute renal failure)   Anemia    Hypotension     The patient has had diarrhea and loss of blood. He has been transfused. His systolic pressures usually in the range of 110. He was receiving a larger fluid bolus as the day went on today. We have decided to lower his fluid to the range of 100 cc  per hour.    paroxysmal atrial fibrillation    There is history of paroxysmal atrial fibrillation. The patient had been anticoagulated. He was not supratherapeutic at the time of admission. I think that he is holding sinus rhythm. His ICD will be interrogated. His rate currently is 100. I suspect that he is tracking his atrial rate.   History of amiodarone therapy     patient's amiodarone will be continued.   long-term use of anticoagulants (Coumadin therapy)     Coumadin is on hold. I am hopeful that is overall GI status was stabilized and then eventually his Coumadin can be restarted.  PLAN:   Will keep fluids at 100cc hr to improve volume and he may well need another unit PRBCs to improve his volume.  (his normal BP is 572 systolic)  Will need to monitor volume status closely.  His normal Cr. Is 1.09 or so.  We  have contacted [employer] and they will interrogate BiVICD to eval for any A fib.   EGD revealed gastritis alone and he is negative for c diff.  I have discussed with Dr. Ron Parker and we will follow along.     [place]  Nurse Practitioner Certified [place] Pager [phone] or after 5pm or weekends call [phone] 08/19/2013, 2:01 PM Patient seen and examined. I agree with the assessment and plan as detailed above. See also my additional thoughts below.   I have reviewed all of the information and seen the patient. I have added my thoughts to the complete note above. I've made the decisions involved in the final problem list and recommendations.  Dola Argyle, MD, [facility] 08/27/2013 3:17 PM

## 2013-08-21 NOTE — Progress Notes (Signed)
BiVICD interrogated, Pt is SR Atrial tracking is set at 100.  His mode switch rate is set at 120 so this was adjusted to 140 while he is ill and not having any A fib.  LV output was at 2 and threshold was at 2 so the LV output was increased to 2.75, to prevent intermittent LV pacing.   I have reviewed this note.   Jerral BonitoJeff Katz, MD

## 2013-08-22 ENCOUNTER — Inpatient Hospital Stay (HOSPITAL_COMMUNITY): Payer: Medicare Other

## 2013-08-22 ENCOUNTER — Encounter (HOSPITAL_COMMUNITY): Payer: Self-pay | Admitting: Gastroenterology

## 2013-08-22 DIAGNOSIS — R5381 Other malaise: Secondary | ICD-10-CM

## 2013-08-22 DIAGNOSIS — J449 Chronic obstructive pulmonary disease, unspecified: Secondary | ICD-10-CM

## 2013-08-22 DIAGNOSIS — A419 Sepsis, unspecified organism: Secondary | ICD-10-CM

## 2013-08-22 DIAGNOSIS — I2589 Other forms of chronic ischemic heart disease: Secondary | ICD-10-CM

## 2013-08-22 DIAGNOSIS — E440 Moderate protein-calorie malnutrition: Secondary | ICD-10-CM

## 2013-08-22 DIAGNOSIS — E871 Hypo-osmolality and hyponatremia: Secondary | ICD-10-CM

## 2013-08-22 DIAGNOSIS — Z95810 Presence of automatic (implantable) cardiac defibrillator: Secondary | ICD-10-CM

## 2013-08-22 DIAGNOSIS — R4182 Altered mental status, unspecified: Secondary | ICD-10-CM

## 2013-08-22 DIAGNOSIS — E119 Type 2 diabetes mellitus without complications: Secondary | ICD-10-CM

## 2013-08-22 DIAGNOSIS — I959 Hypotension, unspecified: Secondary | ICD-10-CM | POA: Diagnosis present

## 2013-08-22 DIAGNOSIS — R5383 Other fatigue: Secondary | ICD-10-CM

## 2013-08-22 LAB — BLOOD GAS, ARTERIAL
Acid-base deficit: 9.9 mmol/L — ABNORMAL HIGH (ref 0.0–2.0)
Bicarbonate: 13.9 mEq/L — ABNORMAL LOW (ref 20.0–24.0)
Drawn by: 331471
O2 CONTENT: 2 L/min
O2 SAT: 98.1 %
Patient temperature: 98.6
TCO2: 13 mmol/L (ref 0–100)
pCO2 arterial: 25.3 mmHg — ABNORMAL LOW (ref 35.0–45.0)
pH, Arterial: 7.36 (ref 7.350–7.450)
pO2, Arterial: 120 mmHg — ABNORMAL HIGH (ref 80.0–100.0)

## 2013-08-22 LAB — CBC
HCT: 28.1 % — ABNORMAL LOW (ref 39.0–52.0)
HEMOGLOBIN: 10.1 g/dL — AB (ref 13.0–17.0)
MCH: 31.6 pg (ref 26.0–34.0)
MCHC: 35.9 g/dL (ref 30.0–36.0)
MCV: 87.8 fL (ref 78.0–100.0)
Platelets: 186 10*3/uL (ref 150–400)
RBC: 3.2 MIL/uL — ABNORMAL LOW (ref 4.22–5.81)
RDW: 18.8 % — ABNORMAL HIGH (ref 11.5–15.5)
WBC: 14.2 10*3/uL — ABNORMAL HIGH (ref 4.0–10.5)

## 2013-08-22 LAB — CORTISOL: Cortisol, Plasma: 30.4 ug/dL

## 2013-08-22 LAB — BASIC METABOLIC PANEL
BUN: 63 mg/dL — AB (ref 6–23)
CALCIUM: 7.4 mg/dL — AB (ref 8.4–10.5)
CO2: 17 meq/L — AB (ref 19–32)
Chloride: 98 mEq/L (ref 96–112)
Creatinine, Ser: 3.84 mg/dL — ABNORMAL HIGH (ref 0.50–1.35)
GFR calc Af Amer: 17 mL/min — ABNORMAL LOW (ref >90)
GFR, EST NON AFRICAN AMERICAN: 14 mL/min — AB (ref >90)
Glucose, Bld: 92 mg/dL (ref 70–99)
Potassium: 4.9 mEq/L (ref 3.7–5.3)
SODIUM: 135 meq/L — AB (ref 137–147)

## 2013-08-22 LAB — PROTIME-INR
INR: 2.05 — ABNORMAL HIGH (ref 0.00–1.49)
INR: 2.33 — ABNORMAL HIGH (ref 0.00–1.49)
PROTHROMBIN TIME: 22.5 s — AB (ref 11.6–15.2)
PROTHROMBIN TIME: 24.8 s — AB (ref 11.6–15.2)

## 2013-08-22 LAB — URINE CULTURE
COLONY COUNT: NO GROWTH
CULTURE: NO GROWTH

## 2013-08-22 LAB — PRO B NATRIURETIC PEPTIDE: Pro B Natriuretic peptide (BNP): 25328 pg/mL — ABNORMAL HIGH (ref 0–125)

## 2013-08-22 LAB — GLUCOSE, CAPILLARY
GLUCOSE-CAPILLARY: 119 mg/dL — AB (ref 70–99)
GLUCOSE-CAPILLARY: 127 mg/dL — AB (ref 70–99)
Glucose-Capillary: 111 mg/dL — ABNORMAL HIGH (ref 70–99)

## 2013-08-22 LAB — LACTIC ACID, PLASMA: LACTIC ACID, VENOUS: 1.5 mmol/L (ref 0.5–2.2)

## 2013-08-22 LAB — PROCALCITONIN: Procalcitonin: 3.85 ng/mL

## 2013-08-22 MED ORDER — SODIUM BICARBONATE 8.4 % IV SOLN
INTRAVENOUS | Status: DC
  Administered 2013-08-22: 15:00:00 via INTRAVENOUS
  Filled 2013-08-22 (×5): qty 150

## 2013-08-22 MED ORDER — LEVALBUTEROL HCL 0.63 MG/3ML IN NEBU: 0.6300 mg | INHALATION_SOLUTION | RESPIRATORY_TRACT | Status: DC | PRN

## 2013-08-22 MED ORDER — DEXTROSE 5 % IV SOLN
1.0000 g | INTRAVENOUS | Status: DC
  Administered 2013-08-22 – 2013-08-23 (×2): 1 g via INTRAVENOUS
  Filled 2013-08-22 (×3): qty 1

## 2013-08-22 MED ORDER — INSULIN ASPART 100 UNIT/ML ~~LOC~~ SOLN
0.0000 [IU] | SUBCUTANEOUS | Status: DC
  Administered 2013-08-22 – 2013-08-23 (×4): 1 [IU] via SUBCUTANEOUS

## 2013-08-22 MED ORDER — BIOTENE DRY MOUTH MT LIQD
15.0000 mL | Freq: Two times a day (BID) | OROMUCOSAL | Status: DC
  Administered 2013-08-22 – 2013-08-23 (×4): 15 mL via OROMUCOSAL

## 2013-08-22 MED ORDER — LEVOTHYROXINE SODIUM 100 MCG IV SOLR
25.0000 ug | Freq: Every day | INTRAVENOUS | Status: DC
  Administered 2013-08-22: 25 ug via INTRAVENOUS
  Filled 2013-08-22 (×2): qty 5

## 2013-08-22 MED ORDER — SODIUM CHLORIDE 0.9 % IV BOLUS (SEPSIS)
750.0000 mL | Freq: Once | INTRAVENOUS | Status: AC
  Administered 2013-08-22: 750 mL via INTRAVENOUS

## 2013-08-22 MED ORDER — SODIUM CHLORIDE 0.9 % IV BOLUS (SEPSIS)
250.0000 mL | Freq: Once | INTRAVENOUS | Status: AC
  Administered 2013-08-22: 250 mL via INTRAVENOUS

## 2013-08-22 MED ORDER — ALBUTEROL SULFATE (2.5 MG/3ML) 0.083% IN NEBU: 2.5000 mg | INHALATION_SOLUTION | RESPIRATORY_TRACT | Status: DC | PRN

## 2013-08-22 MED ORDER — VANCOMYCIN HCL IN DEXTROSE 1-5 GM/200ML-% IV SOLN
1000.0000 mg | INTRAVENOUS | Status: DC
  Administered 2013-08-22: 1000 mg via INTRAVENOUS
  Filled 2013-08-22 (×2): qty 200

## 2013-08-22 MED ORDER — CHLORHEXIDINE GLUCONATE 0.12 % MT SOLN
15.0000 mL | Freq: Two times a day (BID) | OROMUCOSAL | Status: DC
  Administered 2013-08-22 – 2013-08-23 (×3): 15 mL via OROMUCOSAL
  Filled 2013-08-22 (×3): qty 15

## 2013-08-22 MED ORDER — PANTOPRAZOLE SODIUM 40 MG IV SOLR
40.0000 mg | Freq: Two times a day (BID) | INTRAVENOUS | Status: DC
  Filled 2013-08-22 (×2): qty 40

## 2013-08-22 MED ORDER — PHENYLEPHRINE HCL 10 MG/ML IJ SOLN
30.0000 ug/min | INTRAMUSCULAR | Status: DC
  Administered 2013-08-22: 60 ug/min via INTRAVENOUS
  Administered 2013-08-22: 40 ug/min via INTRAVENOUS
  Administered 2013-08-22: 80 ug/min via INTRAVENOUS
  Administered 2013-08-22: 30 ug/min via INTRAVENOUS
  Administered 2013-08-23: 100 ug/min via INTRAVENOUS
  Filled 2013-08-22 (×5): qty 1

## 2013-08-22 NOTE — Progress Notes (Signed)
SUBJECTIVE:  Not answering questions.  OBJECTIVE:   Vitals:   Filed Vitals:   08/22/13 1030 08/22/13 1045 08/22/13 1100 08/22/13 1130  BP: 76/32  84/40 83/48  Pulse: 90 91 91 92  Temp: 97.5 F (36.4 C) 97.5 F (36.4 C) 97.7 F (36.5 C) 97.7 F (36.5 C)  TempSrc:    Core (Comment)  Resp: 15 15 15 22   Height:      Weight:      SpO2: 100% 100% 100% 100%   I&O's:   Intake/Output Summary (Last 24 hours) at 08/22/13 1314 Last data filed at 08/22/13 1200  Gross per 24 hour  Intake 4275.5 ml  Output     40 ml  Net 4235.5 ml   TELEMETRY: Reviewed telemetry pt in AV paced rhytm:     PHYSICAL EXAM General: Chronically ill appearing Head:   Normal cephalic and atramatic  Lungs:  Coarse breath sounds bilaterally. Heart:   HRRR S1 S2  No JVD.   Abdomen: abdomen soft and non-tender Msk:   Normal strength and tone for age. Extremities:  No edema.  DP +1 Neuro: Lethargic Psych: Lethargic   LABS: Basic Metabolic Panel:  Recent Labs  40/98/11 1145 08/22/13 0400  NA 132* 135*  K 4.6 4.9  CL 96 98  CO2 19 17*  GLUCOSE 105* 92  BUN 58* 63*  CREATININE 3.35* 3.84*  CALCIUM 7.5* 7.4*  MG 2.0  --    Liver Function Tests:  Recent Labs  08/30/13 2315  AST 63*  ALT 43  ALKPHOS 227*  BILITOT 1.9*  PROT 5.4*  ALBUMIN 2.3*   No results found for this basename: LIPASE, AMYLASE,  in the last 72 hours CBC:  Recent Labs  08-30-13 2315 08/07/2013 1145 08/15/2013 1600 08/22/13 0400  WBC 14.2* 13.7*  --  14.2*  NEUTROABS 11.6*  --   --   --   HGB 6.8* 8.4* 8.6* 10.1*  HCT 18.8* 23.1* 23.9* 28.1*  MCV 92.2 88.2  --  87.8  PLT 229 202  --  186   Cardiac Enzymes: No results found for this basename: CKTOTAL, CKMB, CKMBINDEX, TROPONINI,  in the last 72 hours BNP: No components found with this basename: POCBNP,  D-Dimer: No results found for this basename: DDIMER,  in the last 72 hours Hemoglobin A1C: No results found for this basename: HGBA1C,  in the last 72  hours Fasting Lipid Panel: No results found for this basename: CHOL, HDL, LDLCALC, TRIG, CHOLHDL, LDLDIRECT,  in the last 72 hours Thyroid Function Tests: No results found for this basename: TSH, T4TOTAL, FREET3, T3FREE, THYROIDAB,  in the last 72 hours Anemia Panel: No results found for this basename: VITAMINB12, FOLATE, FERRITIN, TIBC, IRON, RETICCTPCT,  in the last 72 hours Coag Panel:   Lab Results  Component Value Date   INR 2.05* 08/22/2013   INR 2.78* 08/22/2013   INR 4.90* 08/30/13    RADIOLOGY: Dg Chest Port 1 View  08/22/2013   CLINICAL DATA:  Airspace disease.  EXAM: PORTABLE CHEST - 1 VIEW  COMPARISON:  08/24/2013  FINDINGS: Right upper extremity PICC positioning is stable, at the level of the upper cavoatrial junction. Biventricular pacer wires are unchanged, from the left.  Chronic cardiopericardial enlargement. More hazy appearance of the lower chest is likely from dependent flow of pleural fluid. Pulmonary venous congestion appears stable from prior. Streaky basilar opacities persist. No evidence of pneumothorax.  IMPRESSION: Increased density of the lower chest is likely positional flow of pleural  fluid. Venous congestion and bibasilar lung opacities are stable from yesterday.   Electronically Signed   By: Tiburcio PeaJonathan  Watts M.D.   On: 08/22/2013 06:11   Dg Chest Port 1 View  08/22/2013   CLINICAL DATA:  Labored breathing.  EXAM: PORTABLE CHEST - 1 VIEW  COMPARISON:  Pubic Single view of the chest 08/19/2013, 07/18/2013 and 11/06/2009.  FINDINGS: Right PICC and pacing device are again seen, unchanged. There is cardiomegaly without edema. Airspace disease persists in the left lung base. The right lung is clear. No pneumothorax or pleural effusion.  IMPRESSION: No change in left lower lobe airspace disease which could be due to atelectasis or pneumonia.  Cardiomegaly without edema.   Electronically Signed   By: Drusilla Kannerhomas  Dalessio M.D.   On: 08/07/2013 18:26   Dg Chest Port 1  View  08/10/2013   CLINICAL DATA:  Generalized weakness. Shortness of breath. Diarrhea.  EXAM: PORTABLE CHEST - 1 VIEW  COMPARISON:  Portable examination 07/18/2013, 11/06/2009 and two-view chest x-ray 07/16/2013.  FINDINGS: Cardiac silhouette moderately enlarged but stable. Biventricular pacing defibrillator unchanged. Right arm PICC tip projects over the lower SVC at or near the cavoatrial junction. Consolidation in the left lower lobe with silhouetting of the left hemidiaphragm. Lungs otherwise clear. Pulmonary vascularity normal. No pneumothorax.  IMPRESSION: 1. Left lower lobe atelectasis and/or pneumonia. 2. Stable moderate cardiomegaly without evidence of pulmonary edema.   Electronically Signed   By: Hulan Saashomas  Lawrence M.D.   On: 08/12/2013 00:25      ASSESSMENT: Ischemic cardiomyopathy, profound anemia likely from GI bleeding, acute renal failure, hypothermia  PLAN:  Received blood transfusion. He does not appear to be in heart failure. Given his low temperature, there is concern for sepsis. He may need more IV fluids, especially given his renal failure. Can give IV fluids as long as his respiratory status is not compromised. He does appear quite ill.  Anemia: Hold anticoagulation for now despite history of atrial fibrillation. Continue amiodarone. I was informed by the nurse later that he may require intubation. If so, amiodarone could be given intravenously, especially if he has any arrhythmias which compromises hemodynamics.  Once his anemia and bleeding situation is stabilized, then could reconsider anticoagulation in the future.  Acute renal failure:  Unlikely to be from volume depletion only. Blood pressure is borderline low.  Evaluation for sepsis: Per the critical care physicians.  Pacemaker adjusted yesterday.  Corky CraftsVARANASI,Grason Brailsford S., MD  08/22/2013  1:14 PM

## 2013-08-22 NOTE — Progress Notes (Signed)
Patient ID: Jared Landry, male   DOB: 09-17-1940, 73 y.o.   MRN: [id number] [facility] Progress Note  Jared Landry 73 y.o. 09-17-1940   Subjective: Patient somnolent. Wife not at bedside. No bleeding reported overnight.  Objective: Vital signs in last 24 hours: Filed Vitals:   08/22/13 0730  BP: 91/47  Pulse: 93  Temp: 96.6 F (35.9 C)  Resp: 16    Physical Exam: Gen: somnolent Abd: soft, nontender, nondistended, +BS  Lab Results:  Recent Labs  2013/11/01 1145 08/22/13 0400  NA 132* 135*  K 4.6 4.9  CL 96 98  CO2 19 17*  GLUCOSE 105* 92  BUN 58* 63*  CREATININE 3.35* 3.84*  CALCIUM 7.5* 7.4*  MG 2.0  --     Recent Labs  08/26/2013 2315  AST 63*  ALT 43  ALKPHOS 227*  BILITOT 1.9*  PROT 5.4*  ALBUMIN 2.3*    Recent Labs  08/27/2013 2315 2013/11/01 1145 2013/11/01 1600 08/22/13 0400  WBC 14.2* 13.7*  --  14.2*  NEUTROABS 11.6*  --   --   --   HGB 6.8* 8.4* 8.6* 10.1*  HCT 18.8* 23.1* 23.9* 28.1*  MCV 92.2 88.2  --  87.8  PLT 229 202  --  186    Recent Labs  2013/11/01 1145 08/22/13 0400  LABPROT 28.4* 22.5*  INR 2.78* 2.05*      Assessment/Plan: 73 yo with GI bleed that has resolved since correction of his coagulopathy. Hgb 10.1. No further bleeding. Mental status has declined and still hypotensive. PCCM has been called already per nursing. Dr. Matthias HughsBuccini available to see this weekend. Will sign off. Call if questions.   Jared Landry C. 08/22/2013, 9:31 AM

## 2013-08-22 NOTE — Consult Note (Signed)
Renal Service Consult Note [facility]  Vianne BullsCharles E Vara 08/22/2013 Maree KrabbeSCHERTZ,Delyla Sandeen D Requesting Physician:  Dr Eula ListenHussain  Reason for Consult:  Acute on CKD HPI: The patient is a 73 y.o. year-old with hx of afib, biventricular HF with ICD, DM2, HTN and COPD. Patient was in a SNF for weakness and deconditioning and then developed AMS and hypotension and was admitted and treated for enterococcal urosepsis. He was d/c'd on 07/23/13 and rec'd IV ampicillin via PICC line to complete 4 wk course of abx.  He became ill the night of 1/14 with diarrhea and gen'd weakness. Hb and BP were low at 6 and 90/47 respectively. INR was up. He was admitted to SDU, given IVF's and prbc's, vit K and FFP and IV PPI.  Diuretics were held.  Cdif sent on stool and was negative. Last night UOP was poor and BP then dropped into the 70's. Pt was moved to ICU and placed on vasopressor support with phenylephrine. UOP today so far is 115 cc and creat is up from 3.25 on admission to 3.84 today.  Baseline creatinine is around 1.5-2.1 from Dec 2014 admit.      ROS  reporting abd pain adn diarhhea  no CP or SOB  no focal weakness  Past Medical History  Past Medical History  Diagnosis Date  . Atrial fibrillation     Cardioversion 07/20/11  transiently <3h successful  . CAD (coronary artery disease)     s/p drug eluting stent  . Diabetes mellitus     type II  . Hypertension   . Gout   . Osteoarthritis   . COPD (chronic obstructive pulmonary disease)   . 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 Jude  . Ventricular tachycardia     terminated by the ICD  . Chronic systolic heart failure   . CHF (congestive heart failure)   . Automatic implantable cardioverter-defibrillator in situ   . Shortness of breath   . Neuromuscular disorder     DIABETIC NEUROPATHY   Past Surgical History  Past Surgical History  Procedure Laterality Date  . Appendectomy    . Tonsillectomy    .  Vasectomy    . Surgical i &d after brown recluse inner thigh    . Cardiac defibrillator placement      St. Jude   . Cardioversion  07/20/2011    Procedure: CARDIOVERSION;  Surgeon: Donato SchultzMark Skains, MD;  [facility];  Service: Cardiovascular;  Laterality: N/A;  . Cardioversion  01/18/2012    Procedure: CARDIOVERSION;  Surgeon: Donato SchultzMark Skains, MD;  [facility];  Service: Cardiovascular;  Laterality: N/A;  . Tee without cardioversion N/A 07/22/2013    Procedure: TRANSESOPHAGEAL ECHOCARDIOGRAM (TEE);  Surgeon: Vesta MixerPhilip J Nahser, MD;  [facility];  Service: Cardiovascular;  Laterality: N/A;  . Esophagogastroduodenoscopy N/A 09/03/2013    Procedure: ESOPHAGOGASTRODUODENOSCOPY (EGD);  Surgeon: Shirley FriarVincent C. Schooler, MD;  [facility];  Service: Endoscopy;  Laterality: N/A;   Family History  Family History  Problem Relation Age of Onset  . Heart failure Mother   . Diabetes type II Sister   . Appendicitis Father     rupture   Social History  reports that he quit smoking about 6 years ago. His smoking use included Cigarettes. He smoked 0.30 packs per day. He has never used smokeless tobacco. He reports that he drinks about 16.8 ounces of alcohol per week. He reports that he does not use illicit drugs. Allergies  Allergies  Allergen Reactions  . Gabapentin Other (See Comments)    May cause tremors and shaking  . Pregabalin     Makes confused   Home medications Prior to Admission medications   Medication Sig Start Date End Date Taking? Authorizing Provider  acetaminophen (TYLENOL) 325 MG tablet Take 2 tablets (650 mg total) by mouth every 6 (six) hours as needed for mild pain or fever. 07/23/13  Yes Georgann Housekeeper, MD  albuterol (PROVENTIL HFA;VENTOLIN HFA) 108 (90 BASE) MCG/ACT inhaler Inhale 2 puffs into the lungs every 6 (six) hours as needed. For shortness of breath.   Yes Historical Provider, MD  albuterol (PROVENTIL) (5 MG/ML) 0.5% nebulizer solution Take 0.5 mLs (2.5 mg  total) by nebulization every 6 (six) hours as needed for wheezing or shortness of breath. 07/23/13  Yes Georgann Housekeeper, MD  allopurinol (ZYLOPRIM) 100 MG tablet Take 1 tablet (100 mg total) by mouth daily. Along with 300 mg - total dose of 400 mg of Allopurinol daily. 07/23/13  Yes Georgann Housekeeper, MD  allopurinol (ZYLOPRIM) 300 MG tablet Take 300 mg by mouth daily.   Yes Historical Provider, MD  amiodarone (PACERONE) 200 MG tablet Take 1 tablet (200 mg total) by mouth daily. 07/07/13 07/07/14 Yes Georgann Housekeeper, MD  aspirin EC 81 MG tablet Take 81 mg by mouth daily.   Yes Historical Provider, MD  Astaxanthin 4 MG CAPS Take 1 capsule by mouth daily.   Yes Historical Provider, MD  atorvastatin (LIPITOR) 10 MG tablet Take 5 mg by mouth daily.    Yes Historical Provider, MD  b complex vitamins tablet Take 1 tablet by mouth every other day. Alternate with Zinc   Yes Historical Provider, MD  cholecalciferol (VITAMIN D) 1000 UNITS tablet Take 2,000 Units by mouth every other day.    Yes Historical Provider, MD  Coenzyme Q10 (CO Q-10) 100 MG CAPS Take 1 tablet by mouth daily. Alternate with Magnesium   Yes Historical Provider, MD  colchicine 0.6 MG tablet Take 1 tablet (0.6 mg total) by mouth daily. 07/08/13  Yes Georgann Housekeeper, MD  Fluticasone-Salmeterol (ADVAIR) 100-50 MCG/DOSE AEPB Inhale 1 puff into the lungs 2 (two) times daily as needed.    Yes Historical Provider, MD  furosemide (LASIX) 40 MG tablet Take 40 mg by mouth 2 (two) times daily.   Yes Historical Provider, MD  levothyroxine (SYNTHROID, LEVOTHROID) 50 MCG tablet Take 1 tablet (50 mcg total) by mouth daily before breakfast. 07/07/13  Yes Georgann Housekeeper, MD  Magnesium 300 MG CAPS Take 1 capsule by mouth every other day.    Yes Historical Provider, MD  metoprolol succinate (TOPROL-XL) 100 MG 24 hr tablet Take 50 mg by mouth daily. Take with or immediately following a meal.   Yes Historical Provider, MD  polyethylene glycol (MIRALAX / GLYCOLAX) packet  Take 17 g by mouth daily. 07/23/13  Yes Georgann Housekeeper, MD  potassium chloride (K-DUR) 10 MEQ tablet Take 10 mEq by mouth daily.   Yes Historical Provider, MD  sodium chloride 0.9 % SOLN 50 mL with ampicillin 2 G SOLR 2 g Inject 2 g into the vein every 6 (six) hours. FOR TOTAL OF 4 WEEKS 07/23/13  Yes Georgann Housekeeper, MD  Zinc 100 MG TABS Take 1 tablet by mouth every other day. Alternate with B Complex   Yes Historical Provider, MD  warfarin (COUMADIN) 4 MG tablet 1 tablet ( 4 mg) daily; except 1/2 tablet ( 2 mg) on mondays and fridays 07/07/13   Jerelyn Scott  Donette Larry, MD   Liver Function Tests  Recent Labs Lab 09/06/2013 2315  AST 63*  ALT 43  ALKPHOS 227*  BILITOT 1.9*  PROT 5.4*  ALBUMIN 2.3*   No results found for this basename: LIPASE, AMYLASE,  in the last 168 hours CBC  Recent Labs Lab 08/24/2013 2315 08/20/2013 1145 09/05/2013 1600 08/22/13 0400  WBC 14.2* 13.7*  --  14.2*  NEUTROABS 11.6*  --   --   --   HGB 6.8* 8.4* 8.6* 10.1*  HCT 18.8* 23.1* 23.9* 28.1*  MCV 92.2 88.2  --  87.8  PLT 229 202  --  186   Basic Metabolic Panel  Recent Labs Lab 08/27/2013 2315 08/10/2013 1145 08/22/13 0400  NA 127* 132* 135*  K 4.5 4.6 4.9  CL 90* 96 98  CO2 21 19 17*  GLUCOSE 97 105* 92  BUN 55* 58* 63*  CREATININE 3.25* 3.35* 3.84*  CALCIUM 8.1* 7.5* 7.4*    Exam  Blood pressure 102/55, pulse 88, temperature 97.9 F (36.6 C), temperature source Core (Comment), resp. rate 21, height 5\' 8"  (1.727 m), weight 79.2 kg (174 lb 9.7 oz), SpO2 97.00%. Elderly frail male, lethargic but responds to questions No rash, skin turgor poor Sclera icteric, throat very dry, mouth breathing No JVD, jvp about 8cm Occ bibasilar rales RRR no MRG Abd is distended with what looks like ascites, liver down 6-7 cm, +BS GU nl male genitalia Lower ext 2+ dependent edema of hips and thighs primarily bilat  Pulses in feet diminished, no ulcer or gangrene Neuro is lethargic, but responsive and Ox3  UA prot 30, 0-2  rbc and wbcs, rare bact ,  UNa <20   Assessment/Plan: 1. Acute on CKD3: minimal UOP, low UNa consistent with hemodynamic or ATN.  Not optimistic about recovery.  I have spoken with pt's wife; pt has been declining for last 2 years and she doesn't want to see him suffer. In my opinion pursuing RRT in this situation would not alter what most likely will be a negative outcome in this patient w severe comorbidities and underlying debility. Plan after discussion with Ms Wyss is for no RRT, make pt DNR and consult palliative care to meet with pt's wife tomorrow to further discuss EOL situation.  2. Shock on pressors and IV abx  3. Recent enterococcal urosepsis 4. Biventricular cardiomyopathy with AICD   Vinson Moselle MD (pgr) [phone]    ([phone] 08/22/2013, 3:21 PM

## 2013-08-22 NOTE — Progress Notes (Signed)
Palliative Medicine Consult received. Will follow up with patient and meet with his wife tomorrow to discuss goals of care.  Anderson MaltaElizabeth Golding, DO Palliative Medicine

## 2013-08-22 NOTE — ED Provider Notes (Signed)
Medical screening examination/treatment/procedure(s) were conducted as a shared visit with non-physician practitioner(s) and myself.  I personally evaluated the patient during the encounter.  EKG Interpretation    Date/Time:  Wednesday August 20 2013 23:38:16 EST Ventricular Rate:  108 PR Interval:  63 QRS Duration: 251 QT Interval:  576 QTC Calculation: 772 R Axis:   103 Text Interpretation:  Atrial-ventricular dual-paced complexes No further analysis attempted due to paced rhythm Confirmed by Lucienne Sawyers  MD, Railynn Ballo ([id number] on 09/02/2013 11:47:45 PM           Pt with worsening weakness, appears very pale.  On tx for cdiff.  Plan for admission, transfusion  Olivia Mackielga M Velva Molinari, MD 08/22/13 [phone]

## 2013-08-22 NOTE — Progress Notes (Signed)
Notified Dr. Eldridge DaceVaranasi, Cardiology, that the patient is unable to take PO medications at this time.  Per Dr. Eldridge DaceVaranasi,  IV amiodarone is not necessary  at this time. Will continue to monitor and notify of changes.

## 2013-08-22 NOTE — Progress Notes (Signed)
ANTIBIOTIC CONSULT NOTE - INITIAL  Pharmacy Consult for Vancomycin/Cefepime  Indication: sepsis/HCAP  Allergies  Allergen Reactions  . Gabapentin Other (See Comments)    May cause tremors and shaking  . Pregabalin     Makes confused    Patient Measurements: Height: 5\' 8"  (172.7 cm) Weight: 174 lb 9.7 oz (79.2 kg) IBW/kg (Calculated) : 68.4   Vital Signs: Temp: 96.6 F (35.9 C) (01/16 0730) Temp src: Core (Comment) (01/16 0730) BP: 91/47 mmHg (01/16 0730) Pulse Rate: 93 (01/16 0730) Intake/Output from previous day: 01/15 0701 - 01/16 0700 In: 4875.1 [I.V.:3334.6; Blood:540.5; IV Piggyback:1000] Out: 115 [Urine:115] Intake/Output from this shift:    Labs:  Recent Labs  08/18/2013 2315 08/07/2013 0400 08/22/2013 1145 08/31/2013 1600 08/22/13 0400  WBC 14.2*  --  13.7*  --  14.2*  HGB 6.8*  --  8.4* 8.6* 10.1*  PLT 229  --  202  --  186  LABCREA  --  116.82  --   --   --   CREATININE 3.25*  --  3.35*  --  3.84*   Estimated Creatinine Clearance: 16.6 ml/min (by C-G formula based on Cr of 3.84). No results found for this basename: VANCOTROUGH, Leodis BinetVANCOPEAK, VANCORANDOM, GENTTROUGH, GENTPEAK, GENTRANDOM, TOBRATROUGH, TOBRAPEAK, TOBRARND, AMIKACINPEAK, AMIKACINTROU, AMIKACIN,  in the last 72 hours   Microbiology: Recent Results (from the past 720 hour(s))  MRSA PCR SCREENING     Status: None   Collection Time    08/07/2013  1:53 AM      Result Value Range Status   MRSA by PCR NEGATIVE  NEGATIVE Final   Comment:            The GeneXpert MRSA Assay (FDA     approved for NASAL specimens     only), is one component of a     comprehensive MRSA colonization     surveillance program. It is not     intended to diagnose MRSA     infection nor to guide or     monitor treatment for     MRSA infections.  CLOSTRIDIUM DIFFICILE BY PCR     Status: None   Collection Time    08/11/2013  8:29 AM      Result Value Range Status   C difficile by pcr NEGATIVE  NEGATIVE Final   Comment:  [facility]    Medical History: Past Medical History  Diagnosis Date  . Atrial fibrillation     Cardioversion 07/20/11  transiently <3h successful  . CAD (coronary artery disease)     s/p drug eluting stent  . Diabetes mellitus     type II  . Hypertension   . Gout   . Osteoarthritis   . COPD (chronic obstructive pulmonary disease)   . 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 Jude  . Ventricular tachycardia     terminated by the ICD  . Chronic systolic heart failure   . CHF (congestive heart failure)   . Automatic implantable cardioverter-defibrillator in situ   . Shortness of breath   . Neuromuscular disorder     DIABETIC NEUROPATHY    Medications:  Scheduled:  . amiodarone  200 mg Oral Daily  . antiseptic oral rinse  15 mL Mouth Rinse q12n4p  . atorvastatin  5 mg Oral Daily  . ceFEPime (MAXIPIME) IV  1 g Intravenous Q24H  . chlorhexidine  15 mL Mouth Rinse BID  . levothyroxine  50 mcg Oral  QAC breakfast  . pantoprazole (PROTONIX) IV  40 mg Intravenous Q12H  . sodium chloride  3 mL Intravenous Q12H  . vancomycin  1,000 mg Intravenous Q48H   Infusions:  . sodium chloride    . pantoprozole (PROTONIX) infusion 8 mg/hr (08/22/13 0728)   PRN: acetaminophen, acetaminophen, albuterol, guaiFENesin-dextromethorphan, HYDROcodone-acetaminophen, ondansetron (ZOFRAN) IV, ondansetron, sodium chloride Assessment:  73 yo M from nursing home admitted with GIB, Patient on warfarin PTA, now on hold. Pt.  Had recent admission for enterococcal bacteremia, TEE negative for vegitation, and patient completed 4 weeks of ampicillin 2 grams Q6h on 08/21/2013.  Patient now with worsening renal function, hypotension, lethargic, CXR suspicions for PNA and starting broad spectrum antibiotics with Vancomycin and Cefepime for sepsis/HCAP  WBC elevated 14.2  Temp 96.6   Worsening renal function - SCr elevated to 3.84 with CrCl 16 ml/min   Micro   1/15 Stool: sent 1/15 Blood x 2: sent  1/15 C.diff - Negative - PO vancomycin discontinued 1/15 1/15: Urine - sent    Goal of Therapy:  Vancomycin trough level 15-20 mcg/ml Cefepime per renal function   Plan:  1.) Vancomycin 1 gram IV q48h 2.) Cefepime 1 gram IV q24h  3.) Monitor renal function and urine output  4.) f/u cultures   Melchor Kirchgessner, Loma Messing PharmD Pager #: [phone] 7:59 AM 08/22/2013

## 2013-08-22 NOTE — Consult Note (Signed)
Name: Jared Landry MRN: [id number] DOB: 03/06/1941    ADMISSION DATE:  08/22/2013 CONSULTATION DATE:  08/22/2013  REFERRING MD :  Tyson Dense  CHIEF COMPLAINT:  Diarrhea  BRIEF PATIENT DESCRIPTION:  73 yo male from nursing home with several days of diarrhea, weakness, and pallor.  He was found to have hypotension, anemia, and acute kidney injury.  He was found to have GI bleeding.  He was recently admitted for Enterococcal bacteremia.  He has hx of systolic heart failure, A fib on coumadin.  SIGNIFICANT EVENTS: 1/15 Admit, GI consult, cardiology consult, transfused PRBC/FFP 1/16 Lethargic, PCCM consulted  STUDIES:  10/25/11 Spirometry >> FEV1 1.53 (53%), FEV1% 56 07/19/13 Echo >> mild LVH, EF 30 to 35%, mild MR, severe LA dilation, mod RA dilation 08/12/2013 EGD >> large bezoar, no active bleeding, small non bleeding ulcer in antrum  LINES / TUBES: Rt PICC 07/22/13 >>   CULTURES: C diff 1/15 >> negative Blood 1/15 >>  Urine 1/15 >> negative Stool 1/15 >>   ANTIBIOTICS: Vancomycin 1/16 >> Cefepime 1/16 >>   HISTORY OF PRESENT ILLNESS:   73 yo male was in NH after recent tx for Enterococcal bacteremia.  He developed weakness, diarrhea, and pallor.  He was found to have melena, anemia, and coagulopathy.  He does take coumadin for hx of A fib.  He was seen by GI and cardiology.  He received PRBC and FFP transfusions.  He had EGD done which did not reveal obvious source for bleeding, and concern was for possible small intestinal bleeding.  He was noted to be more lethargic on 1/16 with persistent hypotension.  As a result PCCM consulted.  Pt was not able to provide hx, and hx obtain from pt's wife/medical record/and bedside staff.  PAST MEDICAL HISTORY :  Past Medical History  Diagnosis Date  . Atrial fibrillation     Cardioversion 07/20/11  transiently <3h successful  . CAD (coronary artery disease)     s/p drug eluting stent  . Diabetes mellitus     type II  .  Hypertension   . Gout   . Osteoarthritis   . COPD (chronic obstructive pulmonary disease)   . 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 Jude  . Ventricular tachycardia     terminated by the ICD  . Chronic systolic heart failure   . CHF (congestive heart failure)   . Automatic implantable cardioverter-defibrillator in situ   . Shortness of breath   . Neuromuscular disorder     DIABETIC NEUROPATHY   Past Surgical History  Procedure Laterality Date  . Appendectomy    . Tonsillectomy    . Vasectomy    . Surgical i &d after brown recluse inner thigh    . Cardiac defibrillator placement      St. Jude   . Cardioversion  07/20/2011    Procedure: CARDIOVERSION;  Surgeon: Donato Schultz, MD;  Location: [facility] OR;  Service: Cardiovascular;  Laterality: N/A;  . Cardioversion  01/18/2012    Procedure: CARDIOVERSION;  Surgeon: Donato Schultz, MD;  Location: [facility] OR;  Service: Cardiovascular;  Laterality: N/A;  . Tee without cardioversion N/A 07/22/2013    Procedure: TRANSESOPHAGEAL ECHOCARDIOGRAM (TEE);  Surgeon: Vesta Mixer, MD;  Location: [facility] ENDOSCOPY;  Service: Cardiovascular;  Laterality: N/A;  . Esophagogastroduodenoscopy N/A 08/10/2013    Procedure: ESOPHAGOGASTRODUODENOSCOPY (EGD);  Surgeon: Shirley Friar, MD;  Location: [facility] ENDOSCOPY;  Service: Endoscopy;  Laterality: N/A;   Prior to  Admission medications   Medication Sig Start Date End Date Taking? Authorizing Provider  acetaminophen (TYLENOL) 325 MG tablet Take 2 tablets (650 mg total) by mouth every 6 (six) hours as needed for mild pain or fever. 07/23/13  Yes Georgann Housekeeper, MD  albuterol (PROVENTIL HFA;VENTOLIN HFA) 108 (90 BASE) MCG/ACT inhaler Inhale 2 puffs into the lungs every 6 (six) hours as needed. For shortness of breath.   Yes Historical Provider, MD  albuterol (PROVENTIL) (5 MG/ML) 0.5% nebulizer solution Take 0.5 mLs (2.5 mg total) by nebulization every 6 (six) hours as needed for wheezing or  shortness of breath. 07/23/13  Yes Georgann Housekeeper, MD  allopurinol (ZYLOPRIM) 100 MG tablet Take 1 tablet (100 mg total) by mouth daily. Along with 300 mg - total dose of 400 mg of Allopurinol daily. 07/23/13  Yes Georgann Housekeeper, MD  allopurinol (ZYLOPRIM) 300 MG tablet Take 300 mg by mouth daily.   Yes Historical Provider, MD  amiodarone (PACERONE) 200 MG tablet Take 1 tablet (200 mg total) by mouth daily. 07/07/13 07/07/14 Yes Georgann Housekeeper, MD  aspirin EC 81 MG tablet Take 81 mg by mouth daily.   Yes Historical Provider, MD  Astaxanthin 4 MG CAPS Take 1 capsule by mouth daily.   Yes Historical Provider, MD  atorvastatin (LIPITOR) 10 MG tablet Take 5 mg by mouth daily.    Yes Historical Provider, MD  b complex vitamins tablet Take 1 tablet by mouth every other day. Alternate with Zinc   Yes Historical Provider, MD  cholecalciferol (VITAMIN D) 1000 UNITS tablet Take 2,000 Units by mouth every other day.    Yes Historical Provider, MD  Coenzyme Q10 (CO Q-10) 100 MG CAPS Take 1 tablet by mouth daily. Alternate with Magnesium   Yes Historical Provider, MD  colchicine 0.6 MG tablet Take 1 tablet (0.6 mg total) by mouth daily. 07/08/13  Yes Georgann Housekeeper, MD  Fluticasone-Salmeterol (ADVAIR) 100-50 MCG/DOSE AEPB Inhale 1 puff into the lungs 2 (two) times daily as needed.    Yes Historical Provider, MD  furosemide (LASIX) 40 MG tablet Take 40 mg by mouth 2 (two) times daily.   Yes Historical Provider, MD  levothyroxine (SYNTHROID, LEVOTHROID) 50 MCG tablet Take 1 tablet (50 mcg total) by mouth daily before breakfast. 07/07/13  Yes Georgann Housekeeper, MD  Magnesium 300 MG CAPS Take 1 capsule by mouth every other day.    Yes Historical Provider, MD  metoprolol succinate (TOPROL-XL) 100 MG 24 hr tablet Take 50 mg by mouth daily. Take with or immediately following a meal.   Yes Historical Provider, MD  polyethylene glycol (MIRALAX / GLYCOLAX) packet Take 17 g by mouth daily. 07/23/13  Yes Georgann Housekeeper, MD  potassium  chloride (K-DUR) 10 MEQ tablet Take 10 mEq by mouth daily.   Yes Historical Provider, MD  sodium chloride 0.9 % SOLN 50 mL with ampicillin 2 G SOLR 2 g Inject 2 g into the vein every 6 (six) hours. FOR TOTAL OF 4 WEEKS 07/23/13  Yes Georgann Housekeeper, MD  Zinc 100 MG TABS Take 1 tablet by mouth every other day. Alternate with B Complex   Yes Historical Provider, MD  warfarin (COUMADIN) 4 MG tablet 1 tablet ( 4 mg) daily; except 1/2 tablet ( 2 mg) on mondays and fridays 07/07/13   Georgann Housekeeper, MD   Allergies  Allergen Reactions  . Gabapentin Other (See Comments)    May cause tremors and shaking  . Pregabalin     Makes confused  FAMILY HISTORY:  Family History  Problem Relation Age of Onset  . Heart failure Mother   . Diabetes type II Sister   . Appendicitis Father     rupture   SOCIAL HISTORY:  reports that he quit smoking about 6 years ago. His smoking use included Cigarettes. He smoked 0.30 packs per day. He has never used smokeless tobacco. He reports that he drinks about 16.8 ounces of alcohol per week. He reports that he does not use illicit drugs.  REVIEW OF SYSTEMS:   Unable to obtain.  SUBJECTIVE:   VITAL SIGNS: Temp:  [96.3 F (35.7 C)-97.8 F (36.6 C)] 96.6 F (35.9 C) (01/16 0730) Pulse Rate:  [25-111] 93 (01/16 0730) Resp:  [13-32] 16 (01/16 0730) BP: (72-105)/(44-77) 91/47 mmHg (01/16 0730) SpO2:  [89 %-100 %] 99 % (01/16 0730) Weight:  [174 lb 9.7 oz (79.2 kg)] 174 lb 9.7 oz (79.2 kg) (01/16 0400) HEMODYNAMICS:   VENTILATOR SETTINGS:   INTAKE / OUTPUT: Intake/Output     01/15 0701 - 01/16 0700 01/16 0701 - 01/17 0700   I.V. (mL/kg) 3334.6 (42.1)    Blood 540.5    IV Piggyback 1000    Total Intake(mL/kg) 4875.1 (61.6)    Urine (mL/kg/hr) 115 (0.1)    Total Output 115     Net +4760.1          Stool Occurrence 4 x      PHYSICAL EXAMINATION: General: Ill appearing Neuro:  Somnolent, opens eyes with brisk stimulation, follows simple commands, moves  extremities HEENT:  Pupils reactive, dry oral mucosa, no LAN, temporal wasting Cardiovascular:  Irregular, tachycardic Lungs:  Decreased breath sounds, scattered rhonchi Abdomen:  Soft, decreased bowel sounds, no tenderness Musculoskeletal:  Decreased muscle mass Skin: chronic venous stasis changes  LABS:  CBC  Recent Labs Lab 08/15/2013 2315 08/08/2013 1145 08/15/2013 1600 08/22/13 0400  WBC 14.2* 13.7*  --  14.2*  HGB 6.8* 8.4* 8.6* 10.1*  HCT 18.8* 23.1* 23.9* 28.1*  PLT 229 202  --  186   Coag's  Recent Labs Lab 09/03/2013 2315 08/10/2013 1145 08/22/13 0400  INR 4.90* 2.78* 2.05*   BMET  Recent Labs Lab 08/14/2013 2315 08/12/2013 1145 08/22/13 0400  NA 127* 132* 135*  K 4.5 4.6 4.9  CL 90* 96 98  CO2 21 19 17*  BUN 55* 58* 63*  CREATININE 3.25* 3.35* 3.84*  GLUCOSE 97 105* 92   Electrolytes  Recent Labs Lab 08/22/2013 2315 08/13/2013 1145 08/22/13 0400  CALCIUM 8.1* 7.5* 7.4*  MG  --  2.0  --    Sepsis Markers  Recent Labs Lab 09/04/2013 0003  LATICACIDVEN 1.4   ABG No results found for this basename: PHART, PCO2ART, PO2ART,  in the last 168 hours Liver Enzymes  Recent Labs Lab 08/22/2013 2315  AST 63*  ALT 43  ALKPHOS 227*  BILITOT 1.9*  ALBUMIN 2.3*   Cardiac Enzymes  Recent Labs Lab 08/22/13 0400  PROBNP 25328.0*   Glucose No results found for this basename: GLUCAP,  in the last 168 hours  Imaging Dg Chest Port 1 View  08/22/2013   CLINICAL DATA:  Airspace disease.  EXAM: PORTABLE CHEST - 1 VIEW  COMPARISON:  08/24/2013  FINDINGS: Right upper extremity PICC positioning is stable, at the level of the upper cavoatrial junction. Biventricular pacer wires are unchanged, from the left.  Chronic cardiopericardial enlargement. More hazy appearance of the lower chest is likely from dependent flow of pleural fluid. Pulmonary venous congestion appears stable  from prior. Streaky basilar opacities persist. No evidence of pneumothorax.  IMPRESSION:  Increased density of the lower chest is likely positional flow of pleural fluid. Venous congestion and bibasilar lung opacities are stable from yesterday.   Electronically Signed   By: Tiburcio Pea M.D.   On: 08/22/2013 06:11   Dg Chest Port 1 View  08/26/2013   CLINICAL DATA:  Labored breathing.  EXAM: PORTABLE CHEST - 1 VIEW  COMPARISON:  Pubic Single view of the chest 2013-09-19, 07/18/2013 and 11/06/2009.  FINDINGS: Right PICC and pacing device are again seen, unchanged. There is cardiomegaly without edema. Airspace disease persists in the left lung base. The right lung is clear. No pneumothorax or pleural effusion.  IMPRESSION: No change in left lower lobe airspace disease which could be due to atelectasis or pneumonia.  Cardiomegaly without edema.   Electronically Signed   By: Drusilla Kanner M.D.   On: 08/16/2013 18:26   Dg Chest Port 1 View  08/09/2013   CLINICAL DATA:  Generalized weakness. Shortness of breath. Diarrhea.  EXAM: PORTABLE CHEST - 1 VIEW  COMPARISON:  Portable examination 07/18/2013, 11/06/2009 and two-view chest x-ray 07/16/2013.  FINDINGS: Cardiac silhouette moderately enlarged but stable. Biventricular pacing defibrillator unchanged. Right arm PICC tip projects over the lower SVC at or near the cavoatrial junction. Consolidation in the left lower lobe with silhouetting of the left hemidiaphragm. Lungs otherwise clear. Pulmonary vascularity normal. No pneumothorax.  IMPRESSION: 1. Left lower lobe atelectasis and/or pneumonia. 2. Stable moderate cardiomegaly without evidence of pulmonary edema.   Electronically Signed   By: Hulan Saas M.D.   On: 08/29/2013 00:25    ASSESSMENT / PLAN:  PULMONARY A: Hx of COPD. P:   -prn xopenex -bronchial hygiene -oxygen as needed to keep SpO2 > 92% -f/u CXR 1/17 -f/u ABG  CARDIOVASCULAR A:  Hypotension >> hypovolemia from GI bleed, but also ?developing sepsis. Hx of A fib, CAD, chronic systolic CHF s/p AICD, HTN. P:  -give  additional fluid bolus >> if no improvement, then add phenylephrine -amiodarone per cardiology -f/u lactic acid, cortisol, procalcitonin  RENAL A:   Acute kidney injury. Metabolic acidosis. Hyponatremia. P:   -continue IV fluids -monitor renal fx, urine outpt, electrolytes -f/u ABG  GASTROINTESTINAL A:   Upper GI bleeding. Protein calorie malnutrition. P:   -NPO until mental status improved -protonix per GI   HEMATOLOGIC A:   Acute blood loss anemia. Anemia of critical illness, and chronic disease. Coagulopathy 2nd to coumadin. P:  -f/u CBC, INR -SCD for DVT prevention -Transfuse for Hb < 7 or bleeding  INFECTIOUS A:   Possible HCAP/sepsis developing 1/16. Recent dx of Enterococcal bacteremia. P:   -Day 1 vancomycin, cefepime  ENDOCRINE A:   DM type II. Hx of hypothyroidism, gout. P:   -SSI -change synthroid to IV   NEUROLOGIC A:   Acute encephalopathy 2nd to hypotension, AKI, and possible sepsis. P:   -monitor mental status  Updated pt's wife at bedside.  Discussed pt's current critical status in light of recent hospitalization.  Raised issues of goals of care.  She is not able to make decision about this at present, but would not want long-term support if there was no hope for improvement.  Will discuss further with her depending on pt's response to therapy.  D/w Dr. Donette Larry.  Will have PCCM as primary service while pt is critically ill.  CC time 50 minutes.  Coralyn Helling, MD [facility] Pulmonary/Critical Care 08/22/2013, 10:54 AM Pager:  [phone]  After 3pm call: [phone]

## 2013-08-22 NOTE — Progress Notes (Signed)
Subjective: Low Bp/ decrease UOP lethargic  Objective: Vital signs in last 24 hours: Temp:  [96.3 F (35.7 C)-97.8 F (36.6 C)] 97 F (36.1 C) (01/16 0600) Pulse Rate:  [25-111] 93 (01/16 0600) Resp:  [13-32] 18 (01/16 0600) BP: (72-105)/(30-77) 76/44 mmHg (01/16 0600) SpO2:  [68 %-100 %] 98 % (01/16 0600) Weight:  [79.2 kg (174 lb 9.7 oz)] 79.2 kg (174 lb 9.7 oz) (01/16 0400) Weight change: 4.7 kg (10 lb 5.8 oz) Last BM Date: 08/15/2013  Intake/Output from previous day: 01/15 0701 - 01/16 0700 In: 4875.1 [I.V.:3334.6; Blood:540.5; IV Piggyback:1000] Out: 115 [Urine:115] Intake/Output this shift:    General appearance: fatigued Resp: diminished breath sounds bibasilar Cardio: regular rate and rhythm GI: soft, non-tender; bowel sounds normal; no masses,  no organomegaly  Lab Results:  Recent Labs  08/29/2013 1145 09/06/2013 1600 08/22/13 0400  WBC 13.7*  --  14.2*  HGB 8.4* 8.6* 10.1*  HCT 23.1* 23.9* 28.1*  PLT 202  --  186   BMET  Recent Labs  09/03/2013 1145 08/22/13 0400  NA 132* 135*  K 4.6 4.9  CL 96 98  CO2 19 17*  GLUCOSE 105* 92  BUN 58* 63*  CREATININE 3.35* 3.84*  CALCIUM 7.5* 7.4*    Studies/Results: Dg Chest Port 1 View  08/22/2013   CLINICAL DATA:  Airspace disease.  EXAM: PORTABLE CHEST - 1 VIEW  COMPARISON:  08/28/2013  FINDINGS: Right upper extremity PICC positioning is stable, at the level of the upper cavoatrial junction. Biventricular pacer wires are unchanged, from the left.  Chronic cardiopericardial enlargement. More hazy appearance of the lower chest is likely from dependent flow of pleural fluid. Pulmonary venous congestion appears stable from prior. Streaky basilar opacities persist. No evidence of pneumothorax.  IMPRESSION: Increased density of the lower chest is likely positional flow of pleural fluid. Venous congestion and bibasilar lung opacities are stable from yesterday.   Electronically Signed   By: Tiburcio PeaJonathan  Watts M.D.   On:  08/22/2013 06:11   Dg Chest Port 1 View  08/16/2013   CLINICAL DATA:  Labored breathing.  EXAM: PORTABLE CHEST - 1 VIEW  COMPARISON:  Pubic Single view of the chest Feb 26, 2014, 07/18/2013 and 11/06/2009.  FINDINGS: Right PICC and pacing device are again seen, unchanged. There is cardiomegaly without edema. Airspace disease persists in the left lung base. The right lung is clear. No pneumothorax or pleural effusion.  IMPRESSION: No change in left lower lobe airspace disease which could be due to atelectasis or pneumonia.  Cardiomegaly without edema.   Electronically Signed   By: Drusilla Kannerhomas  Dalessio M.D.   On: 08/29/2013 18:26   Dg Chest Port 1 View  08/26/2013   CLINICAL DATA:  Generalized weakness. Shortness of breath. Diarrhea.  EXAM: PORTABLE CHEST - 1 VIEW  COMPARISON:  Portable examination 07/18/2013, 11/06/2009 and two-view chest x-ray 07/16/2013.  FINDINGS: Cardiac silhouette moderately enlarged but stable. Biventricular pacing defibrillator unchanged. Right arm PICC tip projects over the lower SVC at or near the cavoatrial junction. Consolidation in the left lower lobe with silhouetting of the left hemidiaphragm. Lungs otherwise clear. Pulmonary vascularity normal. No pneumothorax.  IMPRESSION: 1. Left lower lobe atelectasis and/or pneumonia. 2. Stable moderate cardiomegaly without evidence of pulmonary edema.   Electronically Signed   By: Hulan Saashomas  Lawrence M.D.   On: 08/19/2013 00:25    Medications: I have reviewed the patient's current medications.  Assessment/Plan: Severe Anemia- proabable UGI source- EGD- noted no active bleed- bleed from intestinal mucosa or  small antral bleed stopped.-continue PPIcom A/C Renal failure- worsing- with combination of Hypotension/ CHF- renal consult Biventricular heart failure- elevated BNP- ( difficult to access in setting of worsing renal failure) H/o -Bacteriemia with urine source in 12/14- finish 4 week of Ampicillin- blood culture resent- CHF- hypotension/  hypothermia - sepsis CXR with some opacities and pleural fluids- start on Vanc and cefepime IV- CCM consult- consider levofed C diff negative. Afib/ On amiodarone; BB - ACID introgation-ok DM- borderline- no med  Hypothyroid- on synthroid Prognosis Gaurded  change to ICU status    LOS: 2 days   Consuelo Thayne 08/22/2013, 7:12 AM

## 2013-08-22 NOTE — Progress Notes (Signed)
Spoke with on page MD [facility](Shamleffer) twice during the night regarding patients current status. As of 5 a.m patient had 30 ml UOP for the night. I flush the catheter and bladder scanned the patient (vol 119 ml). Patient is also having increased lethargy/confusion and continued low blood pressures. Verbal order to give one 1,000 cc bolus for now and MD Donette Larry(Husain) would be around to see him shortly. Will cont. To monitor

## 2013-08-23 ENCOUNTER — Inpatient Hospital Stay (HOSPITAL_COMMUNITY): Payer: Medicare Other

## 2013-08-23 DIAGNOSIS — Z515 Encounter for palliative care: Secondary | ICD-10-CM

## 2013-08-23 DIAGNOSIS — Z66 Do not resuscitate: Secondary | ICD-10-CM | POA: Diagnosis present

## 2013-08-23 LAB — GLUCOSE, CAPILLARY
GLUCOSE-CAPILLARY: 134 mg/dL — AB (ref 70–99)
Glucose-Capillary: 118 mg/dL — ABNORMAL HIGH (ref 70–99)
Glucose-Capillary: 123 mg/dL — ABNORMAL HIGH (ref 70–99)
Glucose-Capillary: 144 mg/dL — ABNORMAL HIGH (ref 70–99)
Glucose-Capillary: 125 mg/dL — ABNORMAL HIGH (ref 70–99)
Glucose-Capillary: 104 mg/dL — ABNORMAL HIGH (ref 70–99)

## 2013-08-23 LAB — CBC
HCT: 28.1 % — ABNORMAL LOW (ref 39.0–52.0)
Hemoglobin: 9.8 g/dL — ABNORMAL LOW (ref 13.0–17.0)
MCH: 30.7 pg (ref 26.0–34.0)
MCHC: 34.9 g/dL (ref 30.0–36.0)
MCV: 88.1 fL (ref 78.0–100.0)
Platelets: 183 10*3/uL (ref 150–400)
RBC: 3.19 MIL/uL — ABNORMAL LOW (ref 4.22–5.81)
RDW: 19.8 % — ABNORMAL HIGH (ref 11.5–15.5)
WBC: 17.6 10*3/uL — ABNORMAL HIGH (ref 4.0–10.5)

## 2013-08-23 LAB — PROCALCITONIN: PROCALCITONIN: 5.24 ng/mL

## 2013-08-23 LAB — BASIC METABOLIC PANEL
BUN: 66 mg/dL — ABNORMAL HIGH (ref 6–23)
CO2: 16 meq/L — AB (ref 19–32)
Calcium: 6.9 mg/dL — ABNORMAL LOW (ref 8.4–10.5)
Chloride: 98 mEq/L (ref 96–112)
Creatinine, Ser: 4.16 mg/dL — ABNORMAL HIGH (ref 0.50–1.35)
GFR calc Af Amer: 15 mL/min — ABNORMAL LOW (ref >90)
GFR calc non Af Amer: 13 mL/min — ABNORMAL LOW (ref >90)
GLUCOSE: 133 mg/dL — AB (ref 70–99)
Potassium: 4.5 mEq/L (ref 3.7–5.3)
Sodium: 134 mEq/L — ABNORMAL LOW (ref 137–147)

## 2013-08-23 LAB — PROTIME-INR
INR: 2.93 — ABNORMAL HIGH (ref 0.00–1.49)
Prothrombin Time: 29.5 seconds — ABNORMAL HIGH (ref 11.6–15.2)

## 2013-08-23 MED ORDER — ATROPINE SULFATE 1 % OP SOLN
4.0000 [drp] | OPHTHALMIC | Status: DC | PRN
  Administered 2013-08-23: 4 [drp] via SUBLINGUAL
  Filled 2013-08-23: qty 2

## 2013-08-23 MED ORDER — SODIUM CHLORIDE 0.9 % IV SOLN
20.0000 ug/h | INTRAVENOUS | Status: DC
  Administered 2013-08-23: 20 ug/h via INTRAVENOUS
  Filled 2013-08-23: qty 50

## 2013-08-23 MED ORDER — MORPHINE SULFATE 2 MG/ML IJ SOLN
2.0000 mg | INTRAMUSCULAR | Status: DC | PRN
  Administered 2013-08-23: 4 mg via INTRAVENOUS
  Filled 2013-08-23: qty 2

## 2013-08-23 MED ORDER — LORAZEPAM 2 MG/ML IJ SOLN: 1.0000 mg | INTRAMUSCULAR | Status: DC | PRN

## 2013-08-23 MED ORDER — PHENYLEPHRINE HCL 10 MG/ML IJ SOLN
30.0000 ug/min | INTRAMUSCULAR | Status: DC
  Administered 2013-08-23: 100 ug/min via INTRAVENOUS
  Administered 2013-08-23 (×2): 160 ug/min via INTRAVENOUS
  Filled 2013-08-23 (×4): qty 4

## 2013-08-23 MED ORDER — MORPHINE SULFATE 2 MG/ML IJ SOLN: 2.0000 mg | INTRAMUSCULAR | Status: DC | PRN

## 2013-08-23 MED ORDER — FENTANYL BOLUS VIA INFUSION
25.0000 ug | INTRAVENOUS | Status: DC | PRN
  Filled 2013-08-23: qty 25

## 2013-08-24 NOTE — Progress Notes (Signed)
Fentanyl gtt wasted: 200 ml in sink. Witnessed by [place] OuchBecky Davis, RN

## 2013-08-25 LAB — TYPE AND SCREEN
ABO/RH(D): A POS
Antibody Screen: NEGATIVE
UNIT DIVISION: 0
Unit division: 0
Unit division: 0
Unit division: 0

## 2013-08-25 LAB — PREPARE FRESH FROZEN PLASMA
Unit division: 0
Unit division: 0

## 2013-08-25 LAB — STOOL CULTURE

## 2013-08-26 ENCOUNTER — Encounter: Payer: Medicare Other | Admitting: Cardiology

## 2013-08-27 LAB — CULTURE, BLOOD (ROUTINE X 2)
CULTURE: NO GROWTH
Culture: NO GROWTH

## 2013-09-05 NOTE — Discharge Summary (Signed)
Physician Discharge Summary  Patient ID: Jared Landry MRN: [id number] DOB/AGE: 1941/06/25 73 y.o.  Admit date: March 17, 2014 Discharge date: 09/05/2013  Admission Diagnoses:  Discharge Diagnoses:  Principal Problem:   GIB (gastrointestinal bleeding) Sepsis Acute renal failure Active Problems:   DIABETES MELLITUS, TYPE II   LBBB   SYSTOLIC HEART FAILURE, CHRONIC   GERD   Ventricular tachycardia   Ischemic cardiomyopathy   Biventricular implantable cardioverter-defibrillator in situ   Long term (current) use of anticoagulants   ARF (acute renal failure)   Anemia   Hypotension   Malnutrition of moderate degree   Paroxysmal atrial fibrillation   H/O amiodarone therapy   Sepsis   Hypotension, unspecified   DNR (do not resuscitate)   Discharged Condition: deceased Hospital Course: 73 years old admitted; with hypertension; severe anemia; upper GI bleed Recent history of bacteremia; biventricular heart failure; acute  Renal failure Problem #1: patient was admitted to ICU: BLOOD TRANSFUSION: HEMOGLOBIN WAS  LOW GI consulted: because of patient's condition; supportive  Care without intervention Patient has worsening  Renal failure with hypotens and hypothermia. Early sepsis/pneumonia CCM consult. Cardiology  Consulted: recommend  Supportive care. Because of multiorgan failure: discussion about hospice and palliative care. Palliative  Care consult DNR The patient expired on 08/07/2013.  Consults: cardiology, pulmonary/intensive care and GI  Significant Diagnostic Studies: labs: as above and radiology: CXR: cardiomegaly and CHF  Treatments: IV hydration, antibiotics: vancomycin and Zosyn and blood transfusion  Discharge Exam: Blood pressure 54/23, pulse 71, temperature 100.4 F (38 C), temperature source Core (Comment), resp. rate 0, height 5\' 8"  (1.727 m), weight 86.4 kg (190 lb 7.6 oz), SpO2 97.00%. Resp: rubs bibasilar Cardio: irregularly irregular rhythm GI: soft,  non-tender; bowel sounds normal; no masses,  no organomegaly  Disposition: 20-Expired     Medication List    ASK your doctor about these medications       acetaminophen 325 MG tablet  Commonly known as:  TYLENOL  Take 2 tablets (650 mg total) by mouth every 6 (six) hours as needed for mild pain or fever.     albuterol 108 (90 BASE) MCG/ACT inhaler  Commonly known as:  PROVENTIL HFA;VENTOLIN HFA  Inhale 2 puffs into the lungs every 6 (six) hours as needed. For shortness of breath.     albuterol (5 MG/ML) 0.5% nebulizer solution  Commonly known as:  PROVENTIL  Take 0.5 mLs (2.5 mg total) by nebulization every 6 (six) hours as needed for wheezing or shortness of breath.     allopurinol 100 MG tablet  Commonly known as:  ZYLOPRIM  Take 1 tablet (100 mg total) by mouth daily. Along with 300 mg - total dose of 400 mg of Allopurinol daily.     allopurinol 300 MG tablet  Commonly known as:  ZYLOPRIM  Take 300 mg by mouth daily.     amiodarone 200 MG tablet  Commonly known as:  PACERONE  Take 1 tablet (200 mg total) by mouth daily.     aspirin EC 81 MG tablet  Take 81 mg by mouth daily.     Astaxanthin 4 MG Caps  Take 1 capsule by mouth daily.     atorvastatin 10 MG tablet  Commonly known as:  LIPITOR  Take 5 mg by mouth daily.     b complex vitamins tablet  Take 1 tablet by mouth every other day. Alternate with Zinc     cholecalciferol 1000 UNITS tablet  Commonly known as:  VITAMIN D  Take 2,000  Units by mouth every other day.     Co Q-10 100 MG Caps  Take 1 tablet by mouth daily. Alternate with Magnesium     colchicine 0.6 MG tablet  Take 1 tablet (0.6 mg total) by mouth daily.     Fluticasone-Salmeterol 100-50 MCG/DOSE Aepb  Commonly known as:  ADVAIR  Inhale 1 puff into the lungs 2 (two) times daily as needed.     furosemide 40 MG tablet  Commonly known as:  LASIX  Take 40 mg by mouth 2 (two) times daily.     levothyroxine 50 MCG tablet  Commonly known as:   SYNTHROID, LEVOTHROID  Take 1 tablet (50 mcg total) by mouth daily before breakfast.     Magnesium 300 MG Caps  Take 1 capsule by mouth every other day.     metoprolol succinate 100 MG 24 hr tablet  Commonly known as:  TOPROL-XL  Take 50 mg by mouth daily. Take with or immediately following a meal.     polyethylene glycol packet  Commonly known as:  MIRALAX / GLYCOLAX  Take 17 g by mouth daily.     potassium chloride 10 MEQ tablet  Commonly known as:  K-DUR  Take 10 mEq by mouth daily.     sodium chloride 0.9 % SOLN 50 mL with ampicillin 2 G SOLR 2 g  Inject 2 g into the vein every 6 (six) hours. FOR TOTAL OF 4 WEEKS     warfarin 4 MG tablet  Commonly known as:  COUMADIN  1 tablet ( 4 mg) daily; except 1/2 tablet ( 2 mg) on mondays and fridays     Zinc 100 MG Tabs  Take 1 tablet by mouth every other day. Alternate with B Complex         Signed: Rochele Lueck 09/05/2013, 8:25 AM

## 2013-09-07 NOTE — Progress Notes (Signed)
Name: Jared BullsCharles E Landry MRN: [id number] DOB: [birth year]  ELECTRONIC ICU PHYSICIAN NOTE  Problem:  Conversion to comfort care in progress/ ? Regarding implementing palliative care recs  Intervention:  Discussed with nursing, keep SD status for tonight unless bed needed as may pass tonight but consider trx to floor in am - will escalate palliative measures if needed for symptom managment  Sandrea HughsMichael Wert 08/22/2013, 7:40 PM

## 2013-09-07 NOTE — Progress Notes (Signed)
Subjective:  He is somewhat tachypnea and does not respond to questions.  Objective:  Vital Signs in the last 24 hours: BP 106/53  Pulse 91  Temp(Src) 98.8 F (37.1 C) (Core (Comment))  Resp 27  Ht 5\' 8"  (1.727 m)  Wt 86.4 kg (190 lb 7.6 oz)  BMI 28.97 kg/m2  SpO2 95%  Physical Exam: Thin white male who is lethargic, not responsive to questions and is tachypneic.  Lungs:  Clear Cardiac:  PMI laterally displaced Regular rhythm, normal S1 and S2, no S3, 1-2/6 murmur Abdomen:  Soft, nontender, no masses Extremities:  No edema present  Intake/Output from previous day: 01/16 0701 - 01/17 0700 In: 4486.3 [I.V.:3416.3; IV Piggyback:1000] Out: 125 [Urine:125]  Weight Filed Weights   08/07/2013 0130 08/22/13 0400 08/19/2013 0521  Weight: 74.5 kg (164 lb 3.9 oz) 79.2 kg (174 lb 9.7 oz) 86.4 kg (190 lb 7.6 oz)    Lab Results: Basic Metabolic Panel:  Recent Labs  16/05/9600/16/15 0400 08/24/2013 0400  NA 135* 134*  K 4.9 4.5  CL 98 98  CO2 17* 16*  GLUCOSE 92 133*  BUN 63* 66*  CREATININE 3.84* 4.16*   CBC:  Recent Labs  June 24, 2014 2315  08/22/13 0400 08/15/2013 0400  WBC 14.2*  < > 14.2* 17.6*  NEUTROABS 11.6*  --   --   --   HGB 6.8*  < > 10.1* 9.8*  HCT 18.8*  < > 28.1* 28.1*  MCV 92.2  < > 87.8 88.1  PLT 229  < > 186 183  < > = values in this interval not displayed.  Telemetry: Paced rhythm  Assessment/Plan:  1. Ischemic cardiomyopathy with biventricular defibrillator 2. GI bleeding 3. Acute on chronic renal failure 4. Recent enterococcal sepsis  Recommendations:  At this point not a lot to do in terms of further cardiac workup since the decision has been made for palliative care. His renal function is declining at this point.     Darden PalmerW. Spencer Tilley, Jr.  MD [facility]  08/21/2013, 8:10 AM

## 2013-09-07 NOTE — Progress Notes (Signed)
Called by nurse to visit with the family, who requested the Chaplain, as the patient was father/husband/grandfather to those present.  He had been married once, and the second marriage of 28 years yielded no children.  The spouse was especially upset and seemed fragile, both emotionally and physically.  Nurse reported, however, that she was a strong advocate for her husband, and that she did well with some difficult decisions.  Chaplain spent some time with the children and grandchildren (all grown) and some individual time with the spouse .  Affirmed her feelings of sadness, pain and loneliness.  She had asked for the lord's prayer to be said, and we did that.  [facility]    Rema Jasmineichard Raylie Maddison, Chaplain Pager: [phone]

## 2013-09-07 NOTE — Consult Note (Signed)
Palliative Medicine Team Consult Note  73 yo with multiple medical problems admitted with GIB, sepsis, and enterococcal bacteremia. PMT asked to consult for transition to full comfort, hospice options and goals of care.  Met with Patient's wife, daughter and grandson.  Goals are FULL COMFORT. Anticipate hospital death.  Filed Vitals:   2013/09/08 1815  BP: 90/47  Pulse: 90  Temp: 100 F (37.8 C)  Resp: 11   Pale, Mild distress, labored breathing. Unresponsive. Course ronchi, upper airway secretions Dry MM, blood products in mouth.  Recommendations:   DNR  Full Comfort Measures to include:  Fentanyl Infusion at 10/hr-prn boluses for dyspnea and pain  Ativan for agitation prn  Atropine for secretions  Stop Pressors once fentanyl going  PRN O2, can remove once comfort is achieved  Continue IV PPI to prevent re-bleeding which may cause discomfort and vomiting  KVO fluids  Oral care, NPO  Family requested that I call for a chaplain- appreciate their assistance this evening on call very much.  PPS 10%  He will likely die this evening in ICU. Family have been prepared for this and understand care plan moving forward.  50 minutes. Greater than 50%  of this time was spent counseling and coordinating care related to the above assessment and plan.  Lane Hacker, DO Palliative Medicine

## 2013-09-07 NOTE — Progress Notes (Signed)
Time of Death: 01/27/2014 2332  Pt not breathing. No heart sounds auscultated. Verified by two RNs: myself Everette Rank(Cassandra Mcmanaman, RN) and Si GaulBrooke White, RN  Family present at bedside. Wife stated that she had previously taken all belongings home in the morning. She also listed the funeral home as [facility] [place].  PCCM and Attending (Dr. Roseanne RenoJ. Griffin) notified of time of death.  [place] [facility] Recommendation: eye prep for possible research. Normal Saline drops applied to eyes and covered with dressing.

## 2013-09-07 NOTE — Progress Notes (Signed)
Name: Jared Landry MRN: [id number] DOB: 04-24-1941    ADMISSION DATE:  08/08/2013 CONSULTATION DATE:  08/22/2013  REFERRING MD :  Tyson Dense  CHIEF COMPLAINT:  Diarrhea  BRIEF PATIENT DESCRIPTION:  73 yo male from nursing home with several days of diarrhea, weakness, and pallor.  He was found to have hypotension, anemia, and acute kidney injury.  He was found to have GI bleeding.  He was recently admitted for Enterococcal bacteremia.  He has hx of systolic heart failure, A fib on coumadin.  SIGNIFICANT EVENTS: 1/15 Admit, GI consult, cardiology consult, transfused PRBC/FFP 1/16 Lethargic, PCCM consulted  STUDIES:  10/25/11 Spirometry >> FEV1 1.53 (53%), FEV1% 56 07/19/13 Echo >> mild LVH, EF 30 to 35%, mild MR, severe LA dilation, mod RA dilation 08/27/2013 EGD >> large bezoar, no active bleeding, small non bleeding ulcer in antrum  LINES / TUBES: Rt PICC 07/22/13 >>   CULTURES: C diff 1/15 >> negative Blood 1/15 >>  Urine 1/15 >> negative Stool 1/15 >>   ANTIBIOTICS: Vancomycin 1/16 >> Cefepime 1/16 >>   SUBJECTIVE: Very somnolent with intermittent agonal respiration but withdraws to painful stimuli.  VITAL SIGNS: Temp:  [97 F (36.1 C)-99.1 F (37.3 C)] 99.1 F (37.3 C) (01/17 1015) Pulse Rate:  [77-98] 95 (01/17 1015) Resp:  [14-31] 16 (01/17 1015) BP: (76-138)/(32-86) 93/54 mmHg (01/17 1015) SpO2:  [90 %-100 %] 98 % (01/17 0945) Weight:  [86.4 kg (190 lb 7.6 oz)] 86.4 kg (190 lb 7.6 oz) (01/17 0521) HEMODYNAMICS:   VENTILATOR SETTINGS:   INTAKE / OUTPUT: Intake/Output     01/16 0701 - 01/17 0700 01/17 0701 - 01/18 0700   I.V. (mL/kg) 3416.3 (39.5) 150 (1.7)   Blood     Other 70    IV Piggyback 1000 50   Total Intake(mL/kg) 4486.3 (51.9) 200 (2.3)   Urine (mL/kg/hr) 125 (0.1)    Total Output 125     Net +4361.3 +200        Stool Occurrence 1 x      PHYSICAL EXAMINATION: General: Ill appearing, very somnolent.  Withdraws to pain. Neuro:   Somnolent, opens eyes with stimulation, follows simple commands, moves extremities HEENT:  Pupils reactive, dry oral mucosa, no LAN, temporal wasting Cardiovascular:  Irregular, tachycardic Lungs:  Decreased breath sounds, scattered rhonchi Abdomen:  Soft, decreased bowel sounds, no tenderness Musculoskeletal:  Decreased muscle mass Skin: chronic venous stasis changes  LABS:  CBC  Recent Labs Lab 09/03/2013 1145 08/31/2013 1600 08/22/13 0400 2013-09-22 0400  WBC 13.7*  --  14.2* 17.6*  HGB 8.4* 8.6* 10.1* 9.8*  HCT 23.1* 23.9* 28.1* 28.1*  PLT 202  --  186 183   Coag's  Recent Labs Lab 08/22/13 0400 08/22/13 1450 22-Sep-2013 0400  INR 2.05* 2.33* 2.93*   BMET  Recent Labs Lab 08/14/2013 1145 08/22/13 0400 09/22/13 0400  NA 132* 135* 134*  K 4.6 4.9 4.5  CL 96 98 98  CO2 19 17* 16*  BUN 58* 63* 66*  CREATININE 3.35* 3.84* 4.16*  GLUCOSE 105* 92 133*   Electrolytes  Recent Labs Lab 08/18/2013 1145 08/22/13 0400 09-22-2013 0400  CALCIUM 7.5* 7.4* 6.9*  MG 2.0  --   --    Sepsis Markers  Recent Labs Lab 08/22/2013 0003 08/22/13 1145 2013/09/22 0400  LATICACIDVEN 1.4 1.5  --   PROCALCITON  --  3.85 5.24   ABG  Recent Labs Lab 08/22/13 1103  PHART 7.360  PCO2ART 25.3*  PO2ART 120.0*  Liver Enzymes  Recent Labs Lab 08/08/2013 2315  AST 63*  ALT 43  ALKPHOS 227*  BILITOT 1.9*  ALBUMIN 2.3*   Cardiac Enzymes  Recent Labs Lab 08/22/13 0400  PROBNP 25328.0*   Glucose  Recent Labs Lab 08/22/13 1233 08/22/13 1630 08/22/13 1946 08/18/2013 0006 08/27/2013 0515 09/01/2013 0816  GLUCAP 111* 119* 127* 144* 123* 118*    Imaging Dg Chest Port 1 View  09/04/2013   CLINICAL DATA:  Follow-up atelectasis.  EXAM: PORTABLE CHEST - 1 VIEW  COMPARISON:  08/22/2013  FINDINGS: Portable view of the chest demonstrates a left cardiac ICD. Persistent densities at the left lung base may represent pleural fluid and atelectasis. There is mild peribronchial thickening and  fullness of the central vascular structures. Heart size is enlarged. PICC line tip in the lower SVC.  IMPRESSION: Stable chest radiograph findings. Persistent densities at the left lung base may represent pleural fluid and volume loss.  Mild edema or vascular congestion.  Cardiomegaly.   Electronically Signed   By: Richarda OverlieAdam  Henn M.D.   On: 08/22/2013 08:34   Dg Chest Port 1 View  08/22/2013   CLINICAL DATA:  Airspace disease.  EXAM: PORTABLE CHEST - 1 VIEW  COMPARISON:  12-31-13  FINDINGS: Right upper extremity PICC positioning is stable, at the level of the upper cavoatrial junction. Biventricular pacer wires are unchanged, from the left.  Chronic cardiopericardial enlargement. More hazy appearance of the lower chest is likely from dependent flow of pleural fluid. Pulmonary venous congestion appears stable from prior. Streaky basilar opacities persist. No evidence of pneumothorax.  IMPRESSION: Increased density of the lower chest is likely positional flow of pleural fluid. Venous congestion and bibasilar lung opacities are stable from yesterday.   Electronically Signed   By: Tiburcio PeaJonathan  Watts M.D.   On: 08/22/2013 06:11   Dg Chest Port 1 View  2013-10-06   CLINICAL DATA:  Labored breathing.  EXAM: PORTABLE CHEST - 1 VIEW  COMPARISON:  Pubic Single view of the chest 08/18/2013, 07/18/2013 and 11/06/2009.  FINDINGS: Right PICC and pacing device are again seen, unchanged. There is cardiomegaly without edema. Airspace disease persists in the left lung base. The right lung is clear. No pneumothorax or pleural effusion.  IMPRESSION: No change in left lower lobe airspace disease which could be due to atelectasis or pneumonia.  Cardiomegaly without edema.   Electronically Signed   By: Drusilla Kannerhomas  Dalessio M.D.   On: 12-31-13 18:26    ASSESSMENT / PLAN:  PULMONARY A: Hx of COPD. P:   - PRN xopenex. - Bronchial hygiene - Oxygen as needed to keep SpO2 > 92% - If becomes more agonal will start comfort  measures.  CARDIOVASCULAR A:  Hypotension >> hypovolemia from GI bleed, but also ?developing sepsis. Hx of A fib, CAD, chronic systolic CHF s/p AICD, HTN. P:  - Pressors as ordered but do not increase any further (Neo at 60).  RENAL A:   Acute kidney injury. Metabolic acidosis. Hyponatremia. P:   - Continue IV fluids - Monitor renal fx, urine outpt, electrolytes - D/C further blood draws.  GASTROINTESTINAL A:   Upper GI bleeding. Protein calorie malnutrition. P:   - Comfort feeds at this point. - Protonix per GI.  HEMATOLOGIC A:   Acute blood loss anemia. Anemia of critical illness, and chronic disease. Coagulopathy 2nd to coumadin. P:  - D/C further blood draws. - SCD for DVT prevention. - No further transfusions.  INFECTIOUS A:   Possible HCAP/sepsis developing 1/16. Recent  dx of Enterococcal bacteremia. P:   - Day 2 vancomycin, cefepime.  ENDOCRINE A:   DM type II. Hx of hypothyroidism, gout. P:   - SSI - Changed synthroid to IV   NEUROLOGIC A:   Acute encephalopathy 2nd to hypotension, AKI, and possible sepsis. P:   - Monitor  In my opinion patient is actively dying.  There is nothing further that can be done for him.  Will make morphine available as needed, no further increase in pressor support.  Patient is not uncomfortable right now so will defer to the discretion of the RN and family at this point.  Will transfer back to Dr. Valentina Lucks service, PCCM will sign off, please call back if needed.  CC time 35 minutes.  Alyson Reedy, M.D. [facility] Pulmonary/Critical Care Medicine. Pager: [phone]. After hours pager: [phone].

## 2013-09-07 DEATH — deceased

## 2013-09-16 ENCOUNTER — Ambulatory Visit: Payer: Medicare Other | Admitting: Cardiology

## 2015-11-27 IMAGING — CT CT HEAD W/O CM
1 series · 16 of 30 positions shown, 20 images · non-contrast
Comparison: 12/31/2012

CLINICAL DATA: Fever and confusion.  Weird dreams.  Recent UTI.

EXAM:
CT HEAD WITHOUT CONTRAST
TECHNIQUE: Contiguous axial images were obtained from the base of the skull
through the vertex without intravenous contrast.

[Series 2: head 5.0 h30s · axial · 0.42mm/px · z∈[+254,+394]mm · 16 of 32 slices shown, 20 images]
[im 2/32  brain]
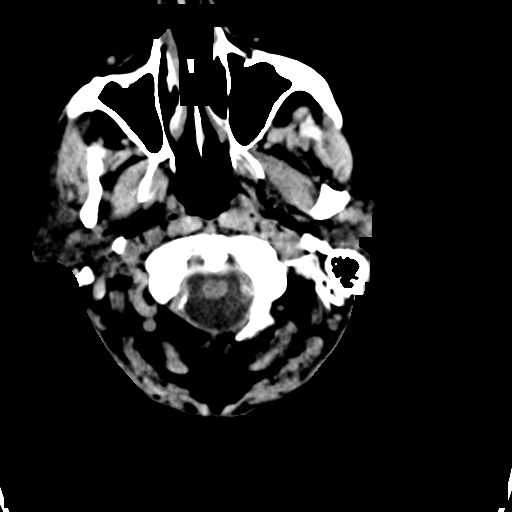
[im 2/32  bone]
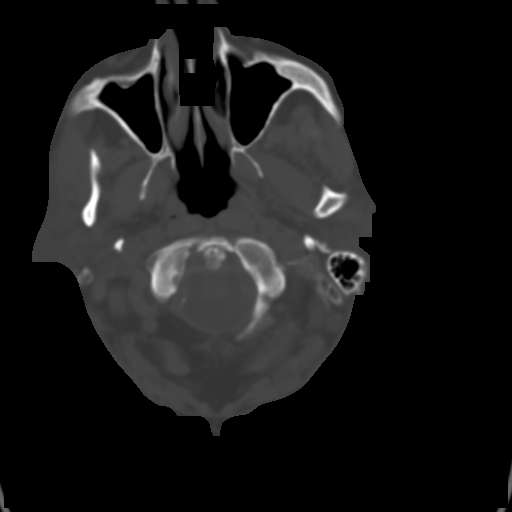
[im 4/32  brain]
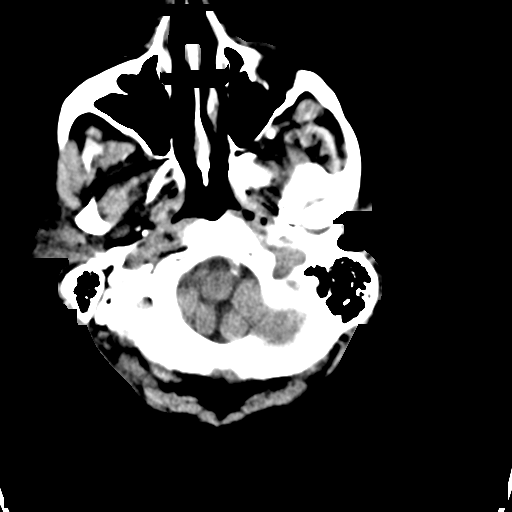
[im 6/32  brain]
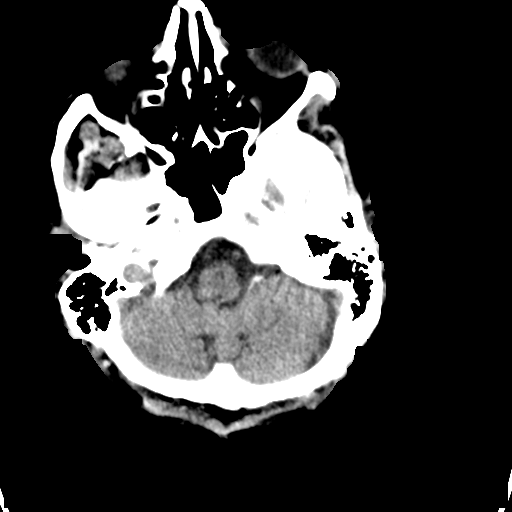
[im 8/32  brain]
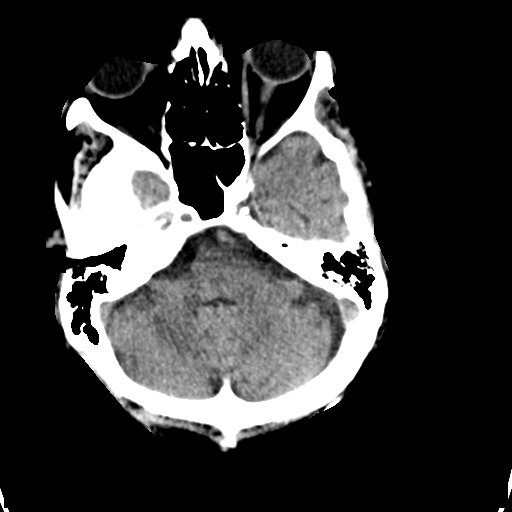
[im 9/32  brain]
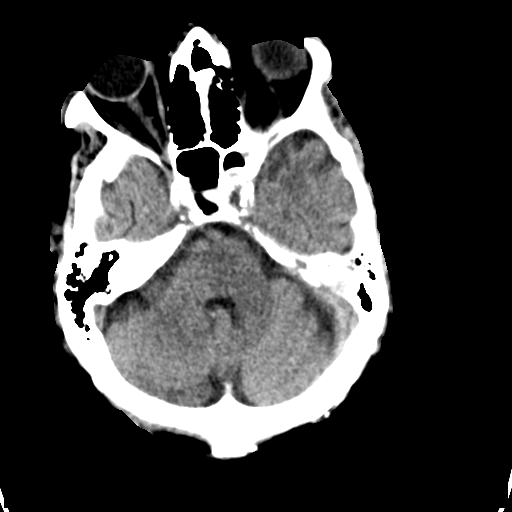
[im 9/32  bone]
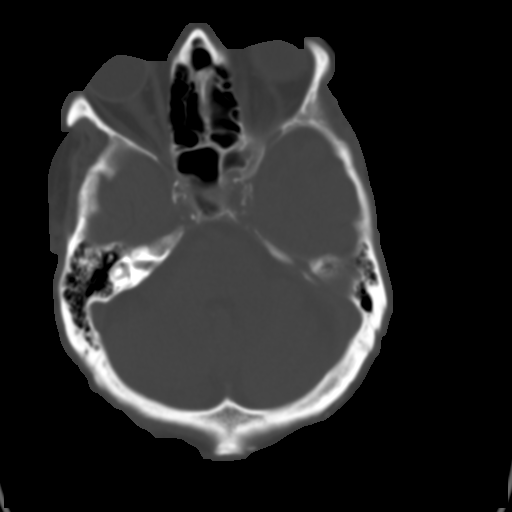
[im 11/32  brain]
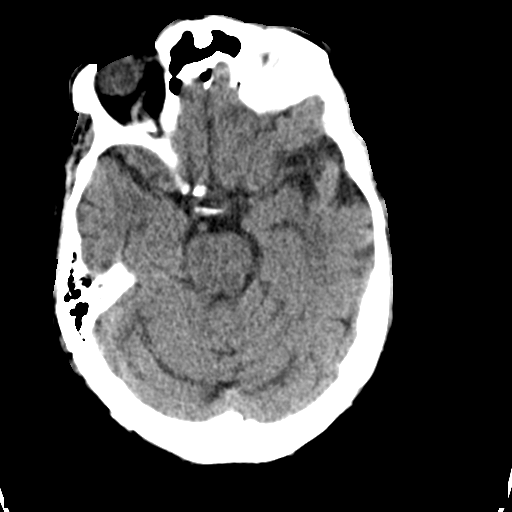
[im 13/32  brain]
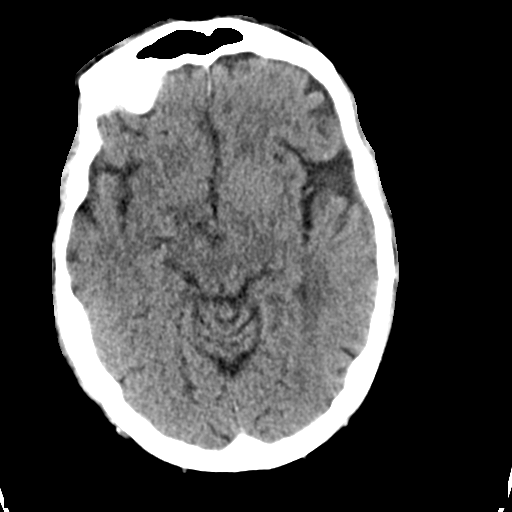
[im 15/32  brain]
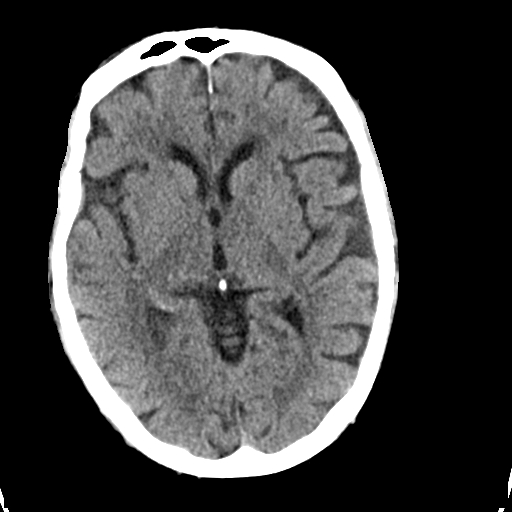
[im 17/32  brain]
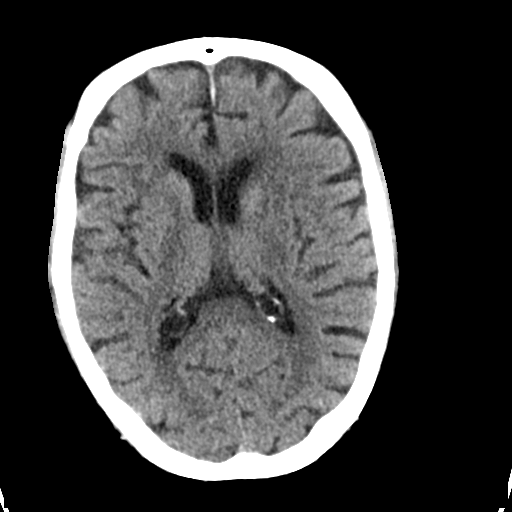
[im 17/32  bone]
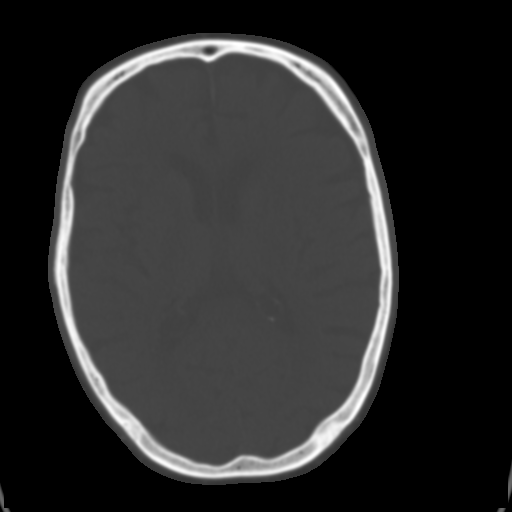
[im 19/32  brain]
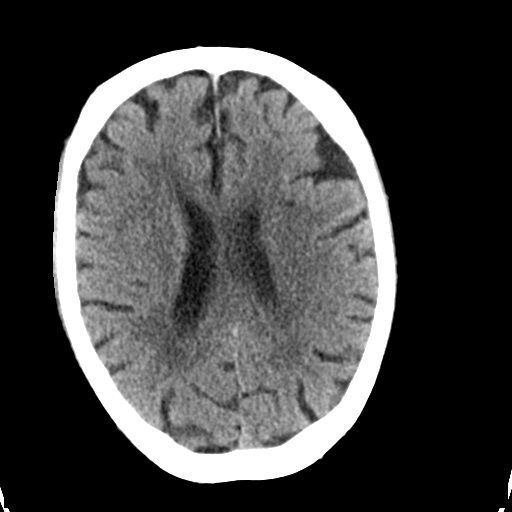
[im 21/32  brain]
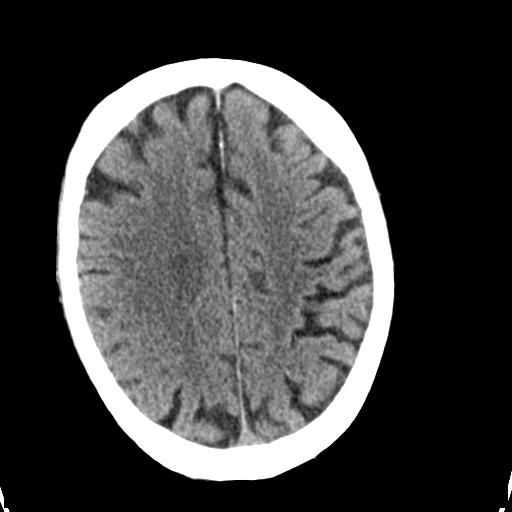
[im 23/32  brain]
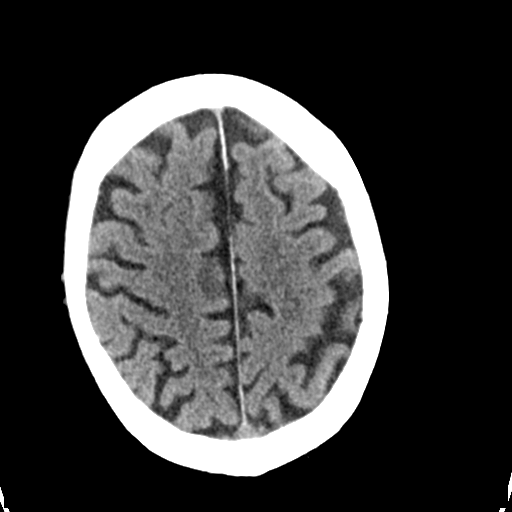
[im 24/32  brain]
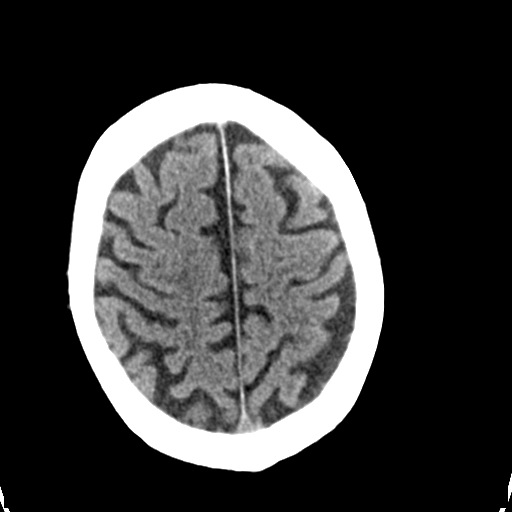
[im 24/32  bone]
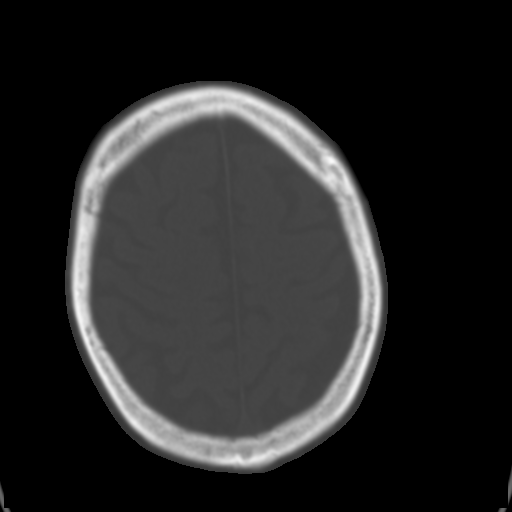
[im 26/32  brain]
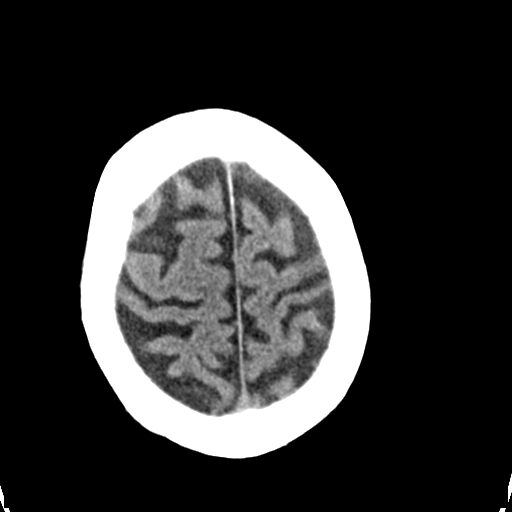
[im 28/32  brain]
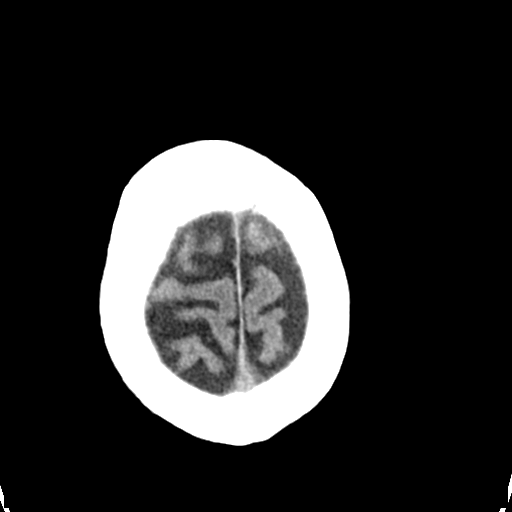
[im 30/32  brain]
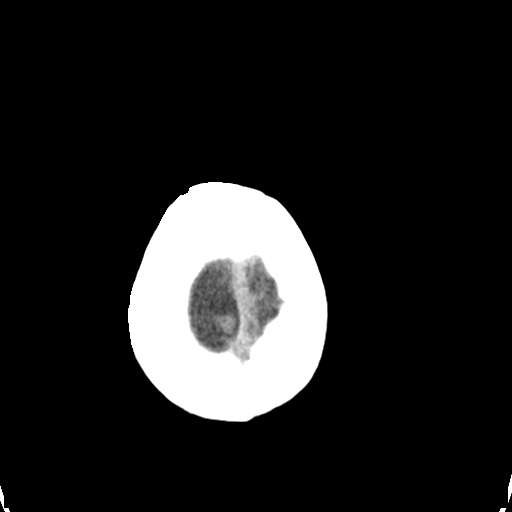

[16 of 30 positions shown; findings below may reference images not displayed]

FINDINGS: Diffuse cerebral atrophy. No ventricular dilatation. Patchy
low-attenuation changes in the deep white matter consistent with
small vessel ischemia. No significant change since previous study.
No mass effect or midline shift. No abnormal extra-axial fluid
collections. Gray-white matter junctions are distinct. Basal
cisterns are not effaced. No evidence of acute intracranial
hemorrhage. No depressed skull fractures. Visualized paranasal
sinuses and mastoid air cells are not opacified. Vascular
calcifications.
IMPRESSION: No acute intracranial abnormalities. Chronic atrophy and small
vessel ischemic changes.

## 2015-11-28 IMAGING — CR DG CHEST 1V
2 series · 2 of 2 positions shown · non-contrast
Comparison: For [DATE]

CLINICAL DATA: Fever.

EXAM:
CHEST - 1 VIEW

[x chest ap (1 of 2)]
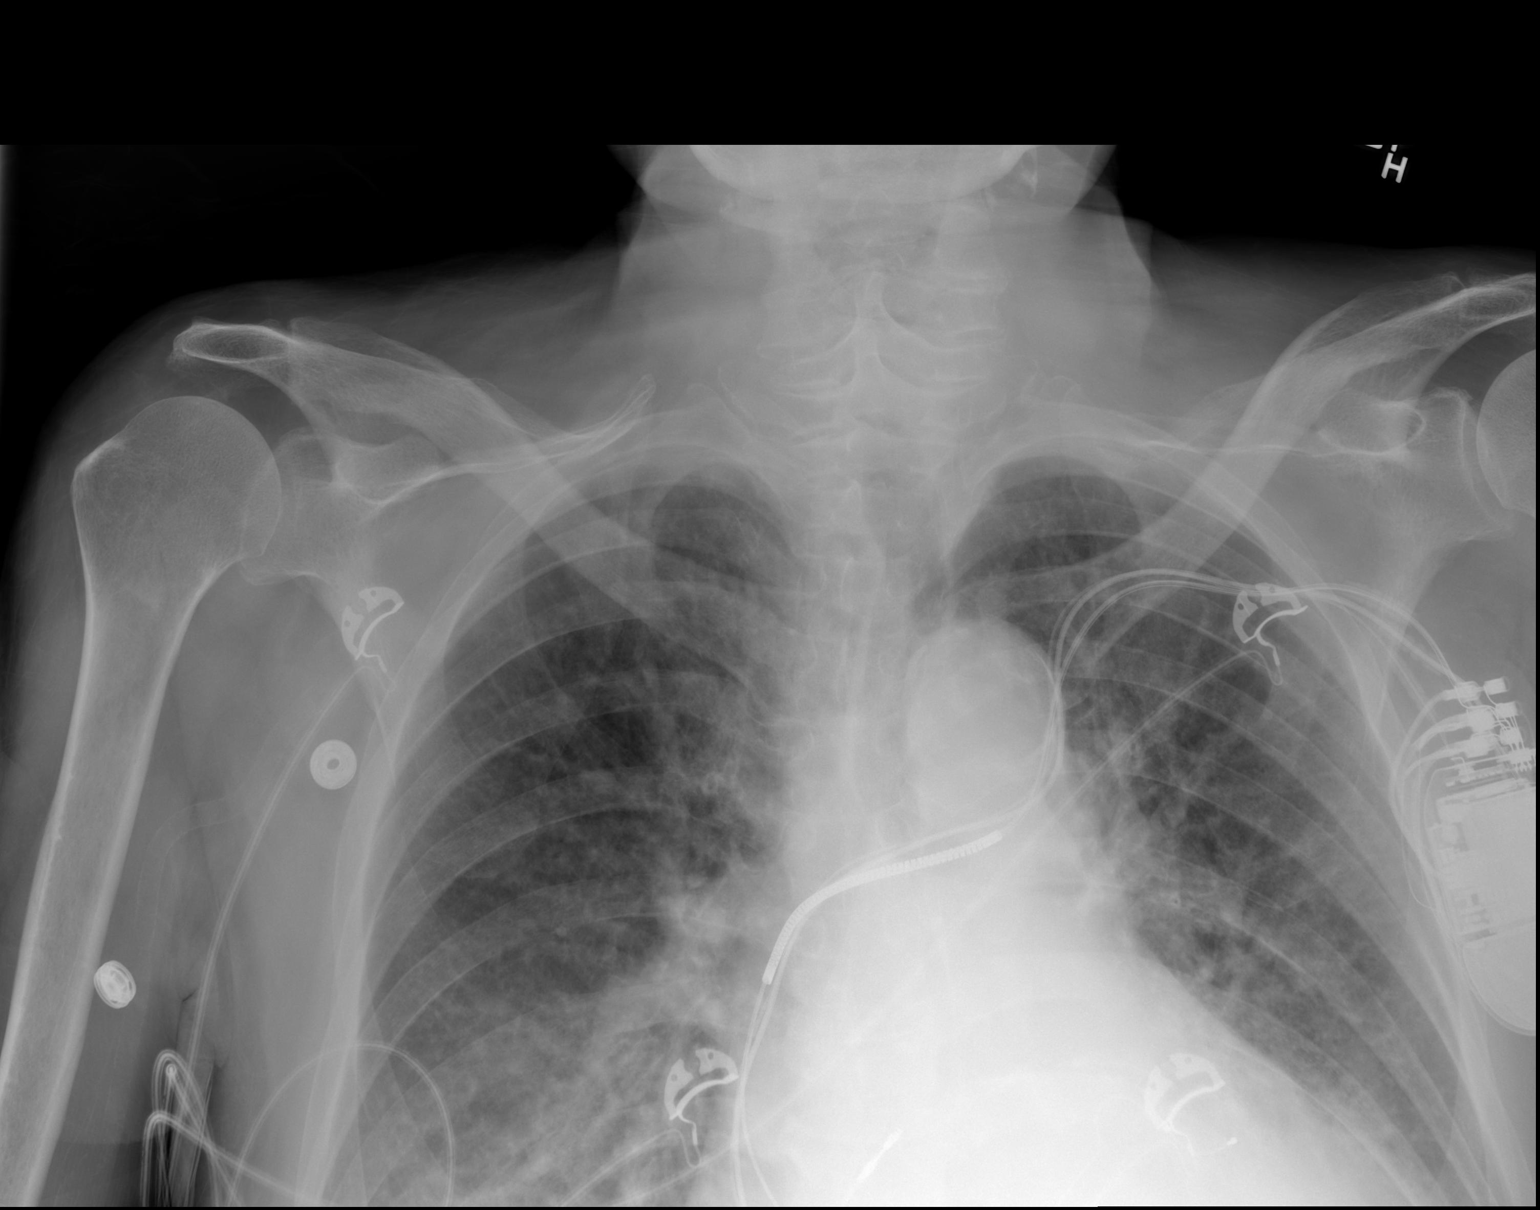

[x chest ap (2 of 2)]
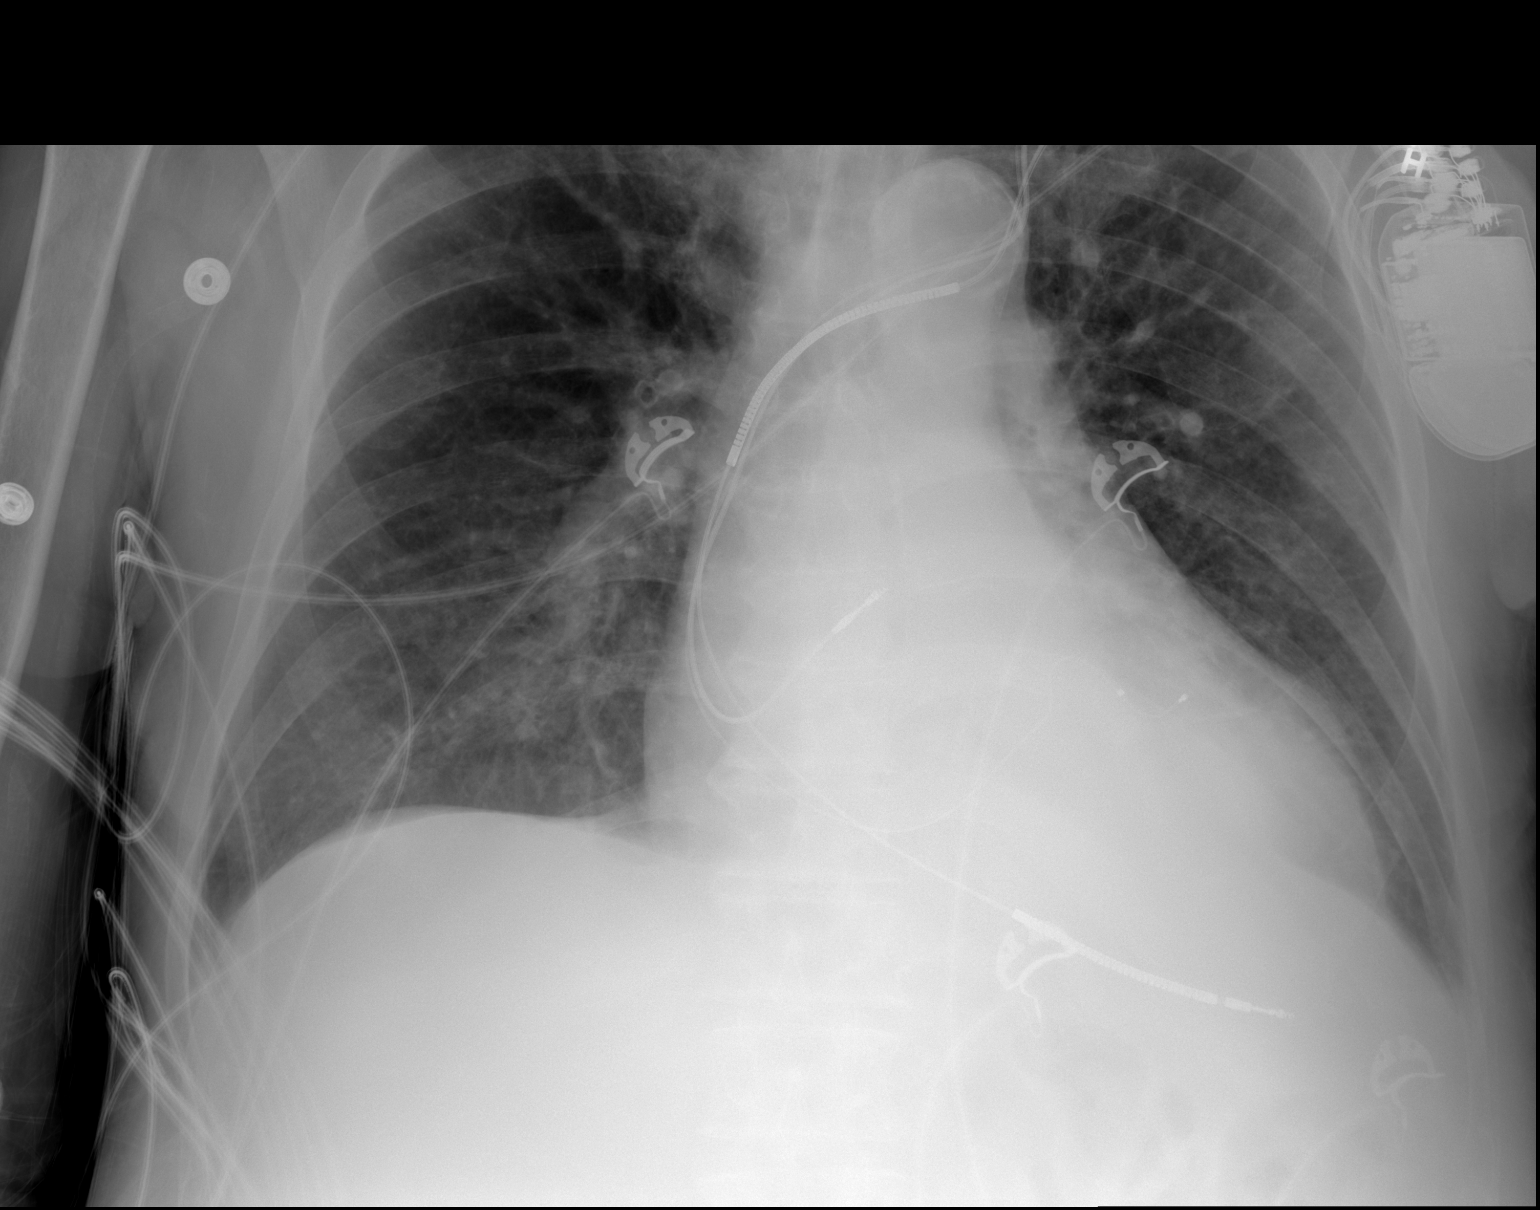

[2 of 2 positions shown; findings below may reference images not displayed]

FINDINGS: Stable appearance of cardiac pacemaker. Cardiac enlargement with
suggestion of mild pulmonary vascular congestion. Small bilateral
pleural effusions. No evidence of edema or consolidation. No
pneumothorax.
IMPRESSION: Cardiac enlargement with pulmonary vascular congestion and small
bilateral pleural effusions.
